# Patient Record
Sex: Female | Born: 1960 | Race: White | Hispanic: No | Marital: Married | Smoking: Current some day smoker
Health system: Southern US, Community
[De-identification: ages and names within clinical notes are randomized; demographics above are authoritative.]

## PROBLEM LIST (undated history)

## (undated) DIAGNOSIS — C50919 Malignant neoplasm of unspecified site of unspecified female breast: Secondary | ICD-10-CM

## (undated) DIAGNOSIS — C801 Malignant (primary) neoplasm, unspecified: Secondary | ICD-10-CM

## (undated) DIAGNOSIS — R42 Dizziness and giddiness: Secondary | ICD-10-CM

## (undated) DIAGNOSIS — R0600 Dyspnea, unspecified: Secondary | ICD-10-CM

## (undated) DIAGNOSIS — I10 Essential (primary) hypertension: Secondary | ICD-10-CM

## (undated) HISTORY — PX: MASTECTOMY: SHX3

## (undated) HISTORY — PX: CARPAL TUNNEL RELEASE: SHX101

---

## 1999-05-18 ENCOUNTER — Other Ambulatory Visit: Admission: RE | Admit: 1999-05-18 | Discharge: 1999-05-18 | Payer: Self-pay | Admitting: Internal Medicine

## 2000-08-04 ENCOUNTER — Encounter: Admission: RE | Admit: 2000-08-04 | Discharge: 2000-08-04 | Payer: Self-pay | Admitting: Endocrinology

## 2000-08-04 ENCOUNTER — Encounter: Payer: Self-pay | Admitting: Endocrinology

## 2002-11-05 ENCOUNTER — Other Ambulatory Visit: Admission: RE | Admit: 2002-11-05 | Discharge: 2002-11-05 | Payer: Self-pay | Admitting: Internal Medicine

## 2003-06-14 ENCOUNTER — Emergency Department (HOSPITAL_COMMUNITY): Admission: EM | Admit: 2003-06-14 | Discharge: 2003-06-14 | Payer: Self-pay | Admitting: Emergency Medicine

## 2003-06-15 ENCOUNTER — Emergency Department (HOSPITAL_COMMUNITY): Admission: EM | Admit: 2003-06-15 | Discharge: 2003-06-16 | Payer: Self-pay | Admitting: Emergency Medicine

## 2004-01-16 ENCOUNTER — Other Ambulatory Visit: Admission: RE | Admit: 2004-01-16 | Discharge: 2004-01-16 | Payer: Self-pay | Admitting: Internal Medicine

## 2006-01-03 ENCOUNTER — Emergency Department (HOSPITAL_COMMUNITY): Admission: EM | Admit: 2006-01-03 | Discharge: 2006-01-03 | Payer: Self-pay | Admitting: Emergency Medicine

## 2006-06-13 ENCOUNTER — Emergency Department (HOSPITAL_COMMUNITY): Admission: EM | Admit: 2006-06-13 | Discharge: 2006-06-13 | Payer: Self-pay | Admitting: Emergency Medicine

## 2007-09-14 ENCOUNTER — Other Ambulatory Visit: Admission: RE | Admit: 2007-09-14 | Discharge: 2007-09-14 | Payer: Self-pay | Admitting: Internal Medicine

## 2008-02-13 ENCOUNTER — Ambulatory Visit (HOSPITAL_BASED_OUTPATIENT_CLINIC_OR_DEPARTMENT_OTHER): Admission: RE | Admit: 2008-02-13 | Discharge: 2008-02-13 | Payer: Self-pay | Admitting: Orthopedic Surgery

## 2008-03-19 ENCOUNTER — Ambulatory Visit (HOSPITAL_BASED_OUTPATIENT_CLINIC_OR_DEPARTMENT_OTHER): Admission: RE | Admit: 2008-03-19 | Discharge: 2008-03-19 | Payer: Self-pay | Admitting: Orthopedic Surgery

## 2008-10-01 ENCOUNTER — Other Ambulatory Visit: Admission: RE | Admit: 2008-10-01 | Discharge: 2008-10-01 | Payer: Self-pay | Admitting: Internal Medicine

## 2008-12-06 ENCOUNTER — Ambulatory Visit (HOSPITAL_BASED_OUTPATIENT_CLINIC_OR_DEPARTMENT_OTHER): Admission: RE | Admit: 2008-12-06 | Discharge: 2008-12-06 | Payer: Self-pay | Admitting: Orthopedic Surgery

## 2009-05-19 ENCOUNTER — Encounter: Admission: RE | Admit: 2009-05-19 | Discharge: 2009-05-19 | Payer: Self-pay | Admitting: Endocrinology

## 2009-05-19 ENCOUNTER — Encounter (INDEPENDENT_AMBULATORY_CARE_PROVIDER_SITE_OTHER): Payer: Self-pay | Admitting: Endocrinology

## 2009-05-19 ENCOUNTER — Encounter (INDEPENDENT_AMBULATORY_CARE_PROVIDER_SITE_OTHER): Payer: Self-pay | Admitting: Diagnostic Radiology

## 2009-05-21 ENCOUNTER — Encounter: Admission: RE | Admit: 2009-05-21 | Discharge: 2009-05-21 | Payer: Self-pay | Admitting: Endocrinology

## 2009-05-30 ENCOUNTER — Encounter (INDEPENDENT_AMBULATORY_CARE_PROVIDER_SITE_OTHER): Payer: Self-pay | Admitting: Surgery

## 2009-05-30 ENCOUNTER — Ambulatory Visit (HOSPITAL_BASED_OUTPATIENT_CLINIC_OR_DEPARTMENT_OTHER): Admission: RE | Admit: 2009-05-30 | Discharge: 2009-05-31 | Payer: Self-pay | Admitting: Surgery

## 2009-06-11 ENCOUNTER — Ambulatory Visit: Payer: Self-pay | Admitting: Oncology

## 2009-06-18 LAB — COMPREHENSIVE METABOLIC PANEL
ALT: 17 U/L (ref 0–35)
AST: 16 U/L (ref 0–37)
Creatinine, Ser: 1.03 mg/dL (ref 0.40–1.20)
Total Bilirubin: 0.4 mg/dL (ref 0.3–1.2)

## 2009-06-18 LAB — CBC WITH DIFFERENTIAL/PLATELET
BASO%: 0.1 % (ref 0.0–2.0)
Basophils Absolute: 0 10*3/uL (ref 0.0–0.1)
EOS%: 1.3 % (ref 0.0–7.0)
HCT: 40.3 % (ref 34.8–46.6)
LYMPH%: 36.8 % (ref 14.0–49.7)
MCH: 29.3 pg (ref 25.1–34.0)
MCHC: 33.2 g/dL (ref 31.5–36.0)
MONO#: 0.4 10*3/uL (ref 0.1–0.9)
NEUT%: 56.7 % (ref 38.4–76.8)
Platelets: 312 10*3/uL (ref 145–400)

## 2009-06-18 LAB — LACTATE DEHYDROGENASE: LDH: 133 U/L (ref 94–250)

## 2009-07-07 ENCOUNTER — Ambulatory Visit (HOSPITAL_COMMUNITY): Admission: RE | Admit: 2009-07-07 | Discharge: 2009-07-07 | Payer: Self-pay | Admitting: Oncology

## 2009-09-12 ENCOUNTER — Ambulatory Visit: Payer: Self-pay | Admitting: Oncology

## 2009-09-17 LAB — CBC WITH DIFFERENTIAL/PLATELET
BASO%: 0.7 % (ref 0.0–2.0)
Basophils Absolute: 0.1 10*3/uL (ref 0.0–0.1)
HCT: 40.5 % (ref 34.8–46.6)
HGB: 13.6 g/dL (ref 11.6–15.9)
LYMPH%: 38.4 % (ref 14.0–49.7)
MCHC: 33.7 g/dL (ref 31.5–36.0)
Platelets: 300 10*3/uL (ref 145–400)
RBC: 4.64 10*6/uL (ref 3.70–5.45)
lymph#: 3.1 10*3/uL (ref 0.9–3.3)

## 2009-09-17 LAB — COMPREHENSIVE METABOLIC PANEL
Albumin: 4.2 g/dL (ref 3.5–5.2)
BUN: 12 mg/dL (ref 6–23)
Creatinine, Ser: 0.71 mg/dL (ref 0.40–1.20)
Glucose, Bld: 116 mg/dL — ABNORMAL HIGH (ref 70–99)
Potassium: 4.2 mEq/L (ref 3.5–5.3)
Sodium: 139 mEq/L (ref 135–145)

## 2009-09-17 LAB — FOLLICLE STIMULATING HORMONE: FSH: 49.3 m[IU]/mL

## 2009-09-24 LAB — ESTRADIOL, ULTRA SENS

## 2009-11-30 ENCOUNTER — Observation Stay (HOSPITAL_COMMUNITY): Admission: EM | Admit: 2009-11-30 | Discharge: 2009-12-01 | Payer: Self-pay | Admitting: Emergency Medicine

## 2009-11-30 ENCOUNTER — Emergency Department (HOSPITAL_COMMUNITY): Admission: EM | Admit: 2009-11-30 | Discharge: 2009-11-30 | Payer: Self-pay | Admitting: Family Medicine

## 2009-12-02 ENCOUNTER — Inpatient Hospital Stay (HOSPITAL_COMMUNITY): Admission: AD | Admit: 2009-12-02 | Discharge: 2009-12-07 | Payer: Self-pay | Admitting: Internal Medicine

## 2009-12-04 ENCOUNTER — Ambulatory Visit: Payer: Self-pay | Admitting: Dentistry

## 2009-12-16 ENCOUNTER — Encounter: Admission: AD | Admit: 2009-12-16 | Discharge: 2009-12-16 | Payer: Self-pay | Admitting: Dentistry

## 2009-12-20 IMAGING — CR DG CHEST 2V
2 series · 2 of 2 positions shown · non-contrast
Comparison: None available

CLINICAL DATA: Breast carcinoma

CHEST - 2 VIEW

[w chest pa]
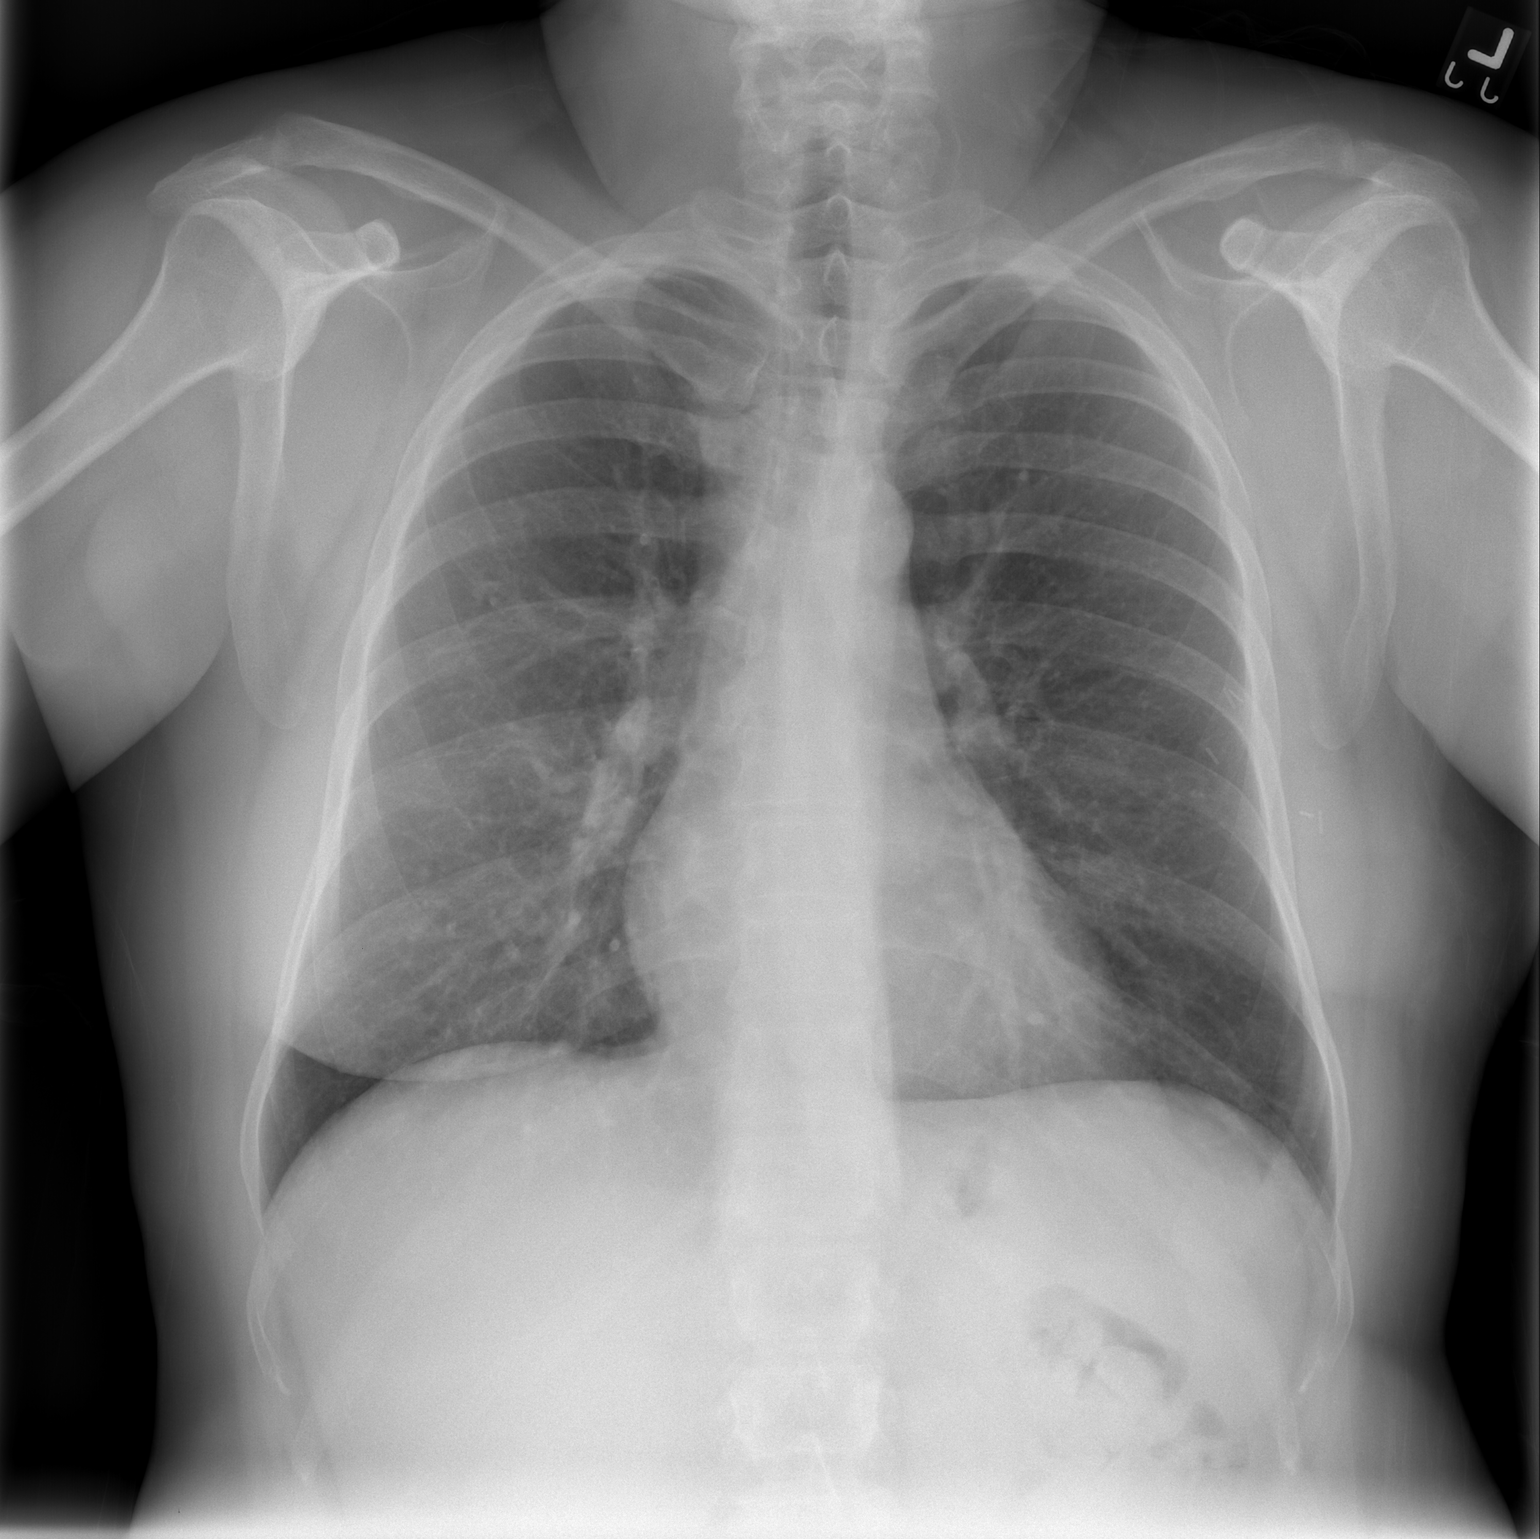

[w chest lat]
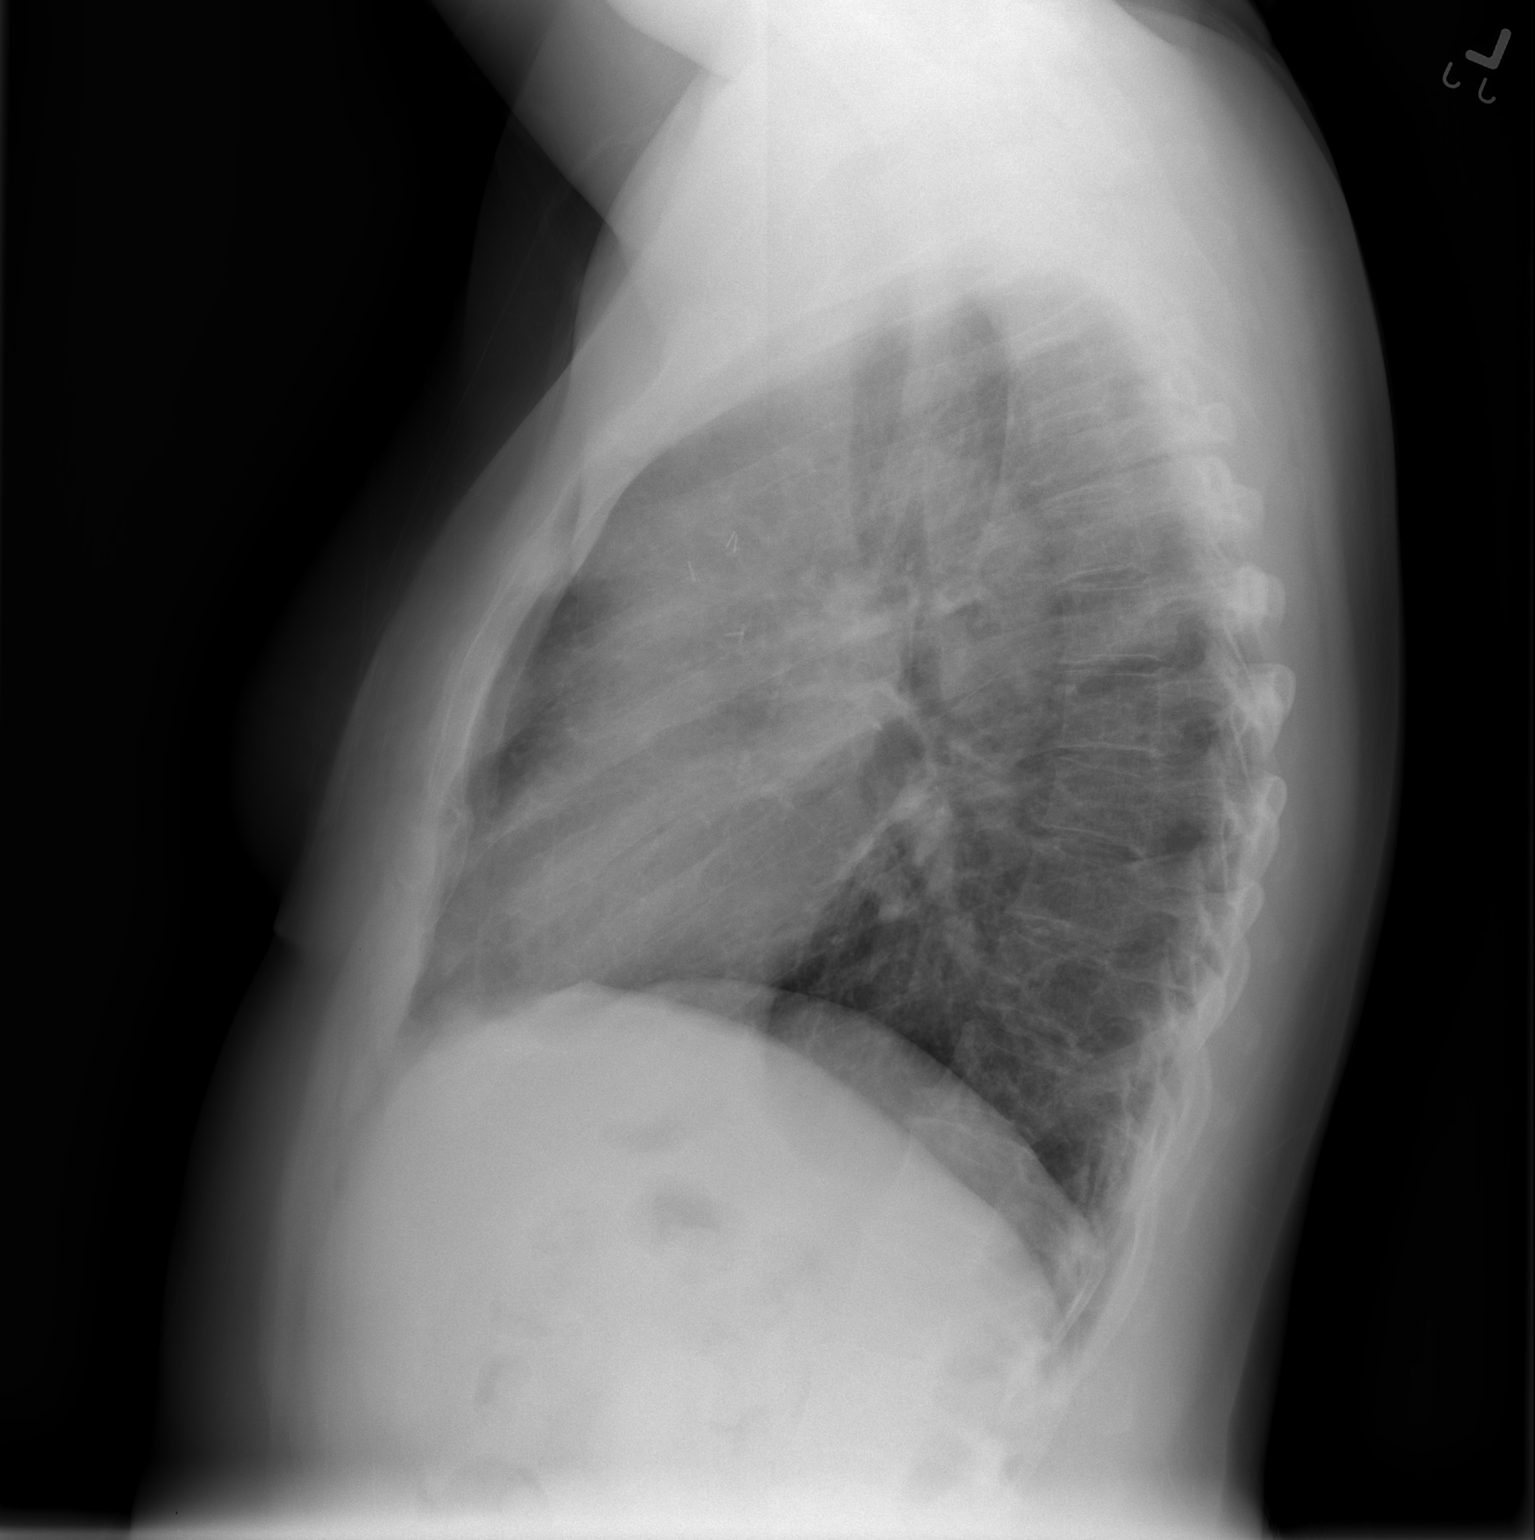

[2 of 2 positions shown; findings below may reference images not displayed]

FINDINGS: Vascular clips in the left axilla.  Changes of left
mastectomy. Lungs clear.  Heart size and pulmonary vascularity
normal.  No effusion.  Visualized bones unremarkable.
IMPRESSION: No acute disease

## 2010-03-17 ENCOUNTER — Ambulatory Visit: Payer: Self-pay | Admitting: Oncology

## 2010-03-19 LAB — COMPREHENSIVE METABOLIC PANEL
AST: 14 U/L (ref 0–37)
Albumin: 3.8 g/dL (ref 3.5–5.2)
Chloride: 103 mEq/L (ref 96–112)
Potassium: 4.6 mEq/L (ref 3.5–5.3)
Total Bilirubin: 0.2 mg/dL — ABNORMAL LOW (ref 0.3–1.2)

## 2010-03-19 LAB — CBC WITH DIFFERENTIAL/PLATELET
BASO%: 0.4 % (ref 0.0–2.0)
Basophils Absolute: 0 10*3/uL (ref 0.0–0.1)
HGB: 13 g/dL (ref 11.6–15.9)
MCH: 29.1 pg (ref 25.1–34.0)
MCHC: 33 g/dL (ref 31.5–36.0)
MONO%: 6.7 % (ref 0.0–14.0)
NEUT#: 4.2 10*3/uL (ref 1.5–6.5)
RBC: 4.46 10*6/uL (ref 3.70–5.45)
RDW: 14.1 % (ref 11.2–14.5)
lymph#: 2.8 10*3/uL (ref 0.9–3.3)

## 2010-03-26 LAB — FOLLICLE STIMULATING HORMONE: FSH: 33.4 m[IU]/mL

## 2010-03-31 ENCOUNTER — Encounter: Admission: RE | Admit: 2010-03-31 | Discharge: 2010-03-31 | Payer: Self-pay | Admitting: Oncology

## 2010-04-03 LAB — ESTRADIOL, ULTRA SENS: Estradiol, Ultra Sensitive: 11 pg/mL

## 2010-04-16 ENCOUNTER — Ambulatory Visit: Payer: Self-pay | Admitting: Oncology

## 2010-07-10 ENCOUNTER — Ambulatory Visit: Payer: Self-pay | Admitting: Oncology

## 2010-08-09 ENCOUNTER — Encounter: Payer: Self-pay | Admitting: Endocrinology

## 2010-08-10 ENCOUNTER — Ambulatory Visit (HOSPITAL_BASED_OUTPATIENT_CLINIC_OR_DEPARTMENT_OTHER): Payer: 59 | Admitting: Oncology

## 2010-08-12 ENCOUNTER — Other Ambulatory Visit: Payer: Self-pay | Admitting: Oncology

## 2010-08-12 DIAGNOSIS — C50919 Malignant neoplasm of unspecified site of unspecified female breast: Secondary | ICD-10-CM

## 2010-10-05 LAB — DIFFERENTIAL
Basophils Absolute: 0.1 10*3/uL (ref 0.0–0.1)
Basophils Relative: 1 % (ref 0–1)
Basophils Relative: 0 % (ref 0–1)
Eosinophils Absolute: 0.1 K/uL (ref 0.0–0.7)
Eosinophils Relative: 1 % (ref 0–5)
Eosinophils Relative: 1 % (ref 0–5)
Lymphocytes Relative: 28 % (ref 12–46)
Lymphocytes Relative: 25 % (ref 12–46)
Lymphocytes Relative: 27 % (ref 12–46)
Lymphs Abs: 3 K/uL (ref 0.7–4.0)
Lymphs Abs: 2.1 10*3/uL (ref 0.7–4.0)
Monocytes Absolute: 0.7 K/uL (ref 0.1–1.0)
Monocytes Absolute: 0.6 10*3/uL (ref 0.1–1.0)
Monocytes Relative: 7 % (ref 3–12)
Monocytes Relative: 6 % (ref 3–12)
Monocytes Relative: 8 % (ref 3–12)
Neutro Abs: 6.8 10*3/uL (ref 1.7–7.7)
Neutro Abs: 4.8 10*3/uL (ref 1.7–7.7)
Neutro Abs: 6.8 10*3/uL (ref 1.7–7.7)
Neutrophils Relative %: 64 % (ref 43–77)
Neutrophils Relative %: 62 % (ref 43–77)

## 2010-10-05 LAB — BASIC METABOLIC PANEL
BUN: 11 mg/dL (ref 6–23)
CO2: 24 mEq/L (ref 19–32)
Calcium: 8.7 mg/dL (ref 8.4–10.5)
Calcium: 8.9 mg/dL (ref 8.4–10.5)
Calcium: 9.1 mg/dL (ref 8.4–10.5)
Creatinine, Ser: 0.98 mg/dL (ref 0.4–1.2)
GFR calc Af Amer: >60 mL/min (ref >60)
GFR calc Af Amer: >60 mL/min (ref >60)
GFR calc non Af Amer: >60 mL/min (ref >60)
GFR calc non Af Amer: >60 mL/min (ref >60)
GFR calc non Af Amer: >60 mL/min (ref >60)
Glucose, Bld: 89 mg/dL (ref 70–99)
Potassium: 4.2 mEq/L (ref 3.5–5.1)
Potassium: 4.9 mEq/L (ref 3.5–5.1)
Sodium: 140 mEq/L (ref 135–145)

## 2010-10-05 LAB — CULTURE, BLOOD (ROUTINE X 2)
Culture: NO GROWTH
Culture: NO GROWTH

## 2010-10-05 LAB — CBC
HCT: 40.7 % (ref 36.0–46.0)
HCT: 37.3 % (ref 36.0–46.0)
Hemoglobin: 13.9 g/dL (ref 12.0–15.0)
Hemoglobin: 12.5 g/dL (ref 12.0–15.0)
MCHC: 34.1 g/dL (ref 30.0–36.0)
MCHC: 32.9 g/dL (ref 30.0–36.0)
MCHC: 33.2 g/dL (ref 30.0–36.0)
MCHC: 33.5 g/dL (ref 30.0–36.0)
MCV: 87.7 fL (ref 78.0–100.0)
MCV: 87.9 fL (ref 78.0–100.0)
Platelets: 249 10*3/uL (ref 150–400)
Platelets: 267 10*3/uL (ref 150–400)
RBC: 4.64 MIL/uL (ref 3.87–5.11)
RBC: 4.2 MIL/uL (ref 3.87–5.11)
RBC: 4.26 MIL/uL (ref 3.87–5.11)
RBC: 4.42 MIL/uL (ref 3.87–5.11)
RDW: 14.2 % (ref 11.5–15.5)
RDW: 14.1 % (ref 11.5–15.5)
RDW: 14.4 % (ref 11.5–15.5)
WBC: 10.7 10*3/uL — ABNORMAL HIGH (ref 4.0–10.5)
WBC: 7.8 10*3/uL (ref 4.0–10.5)

## 2010-10-05 LAB — LIPID PANEL
Cholesterol: 220 mg/dL — ABNORMAL HIGH (ref 0–200)
LDL Cholesterol: 159 mg/dL — ABNORMAL HIGH (ref 0–99)
Total CHOL/HDL Ratio: 7.9 RATIO

## 2010-10-05 LAB — ANAEROBIC CULTURE

## 2010-10-05 LAB — CREATININE, SERUM
Creatinine, Ser: 0.89 mg/dL (ref 0.4–1.2)
GFR calc Af Amer: >60 mL/min (ref >60)

## 2010-10-05 LAB — APTT: aPTT: 29 seconds (ref 24–37)

## 2010-10-05 LAB — BASIC METABOLIC PANEL WITH GFR
Chloride: 105 meq/L (ref 96–112)
Creatinine, Ser: 0.78 mg/dL (ref 0.4–1.2)
GFR calc Af Amer: >60 mL/min (ref >60)
Glucose, Bld: 78 mg/dL (ref 70–99)
Sodium: 135 meq/L (ref 135–145)

## 2010-10-21 LAB — CBC
HCT: 40.2 % (ref 36.0–46.0)
Hemoglobin: 13.9 g/dL (ref 12.0–15.0)
MCV: 86.9 fL (ref 78.0–100.0)
RBC: 4.63 MIL/uL (ref 3.87–5.11)
WBC: 8.6 10*3/uL (ref 4.0–10.5)

## 2010-10-21 LAB — DIFFERENTIAL
Basophils Absolute: 0.1 10*3/uL (ref 0.0–0.1)
Eosinophils Absolute: 0.2 10*3/uL (ref 0.0–0.7)
Eosinophils Relative: 3 % (ref 0–5)
Lymphocytes Relative: 33 % (ref 12–46)

## 2010-10-21 LAB — COMPREHENSIVE METABOLIC PANEL
AST: 17 U/L (ref 0–37)
BUN: 12 mg/dL (ref 6–23)
CO2: 25 mEq/L (ref 19–32)
Chloride: 106 mEq/L (ref 96–112)
Creatinine, Ser: 0.79 mg/dL (ref 0.4–1.2)
GFR calc Af Amer: >60 mL/min (ref >60)
GFR calc non Af Amer: >60 mL/min (ref >60)
Total Bilirubin: 0.5 mg/dL (ref 0.3–1.2)

## 2010-10-27 LAB — POCT HEMOGLOBIN-HEMACUE: Hemoglobin: 14.8 g/dL (ref 12.0–15.0)

## 2010-11-18 ENCOUNTER — Other Ambulatory Visit: Payer: Self-pay | Admitting: Oncology

## 2010-11-18 DIAGNOSIS — R51 Headache: Secondary | ICD-10-CM

## 2010-11-18 DIAGNOSIS — R42 Dizziness and giddiness: Secondary | ICD-10-CM

## 2010-11-24 ENCOUNTER — Other Ambulatory Visit (HOSPITAL_COMMUNITY): Payer: Self-pay

## 2010-11-27 ENCOUNTER — Ambulatory Visit (HOSPITAL_COMMUNITY)
Admission: RE | Admit: 2010-11-27 | Discharge: 2010-11-27 | Disposition: A | Discharge status: Home / Self Care | Payer: 59 | Source: Ambulatory Visit | Attending: Oncology | Admitting: Oncology

## 2010-11-27 DIAGNOSIS — R51 Headache: Secondary | ICD-10-CM | POA: Insufficient documentation

## 2010-11-27 DIAGNOSIS — C50919 Malignant neoplasm of unspecified site of unspecified female breast: Secondary | ICD-10-CM | POA: Insufficient documentation

## 2010-11-27 DIAGNOSIS — R42 Dizziness and giddiness: Secondary | ICD-10-CM | POA: Insufficient documentation

## 2010-11-27 DIAGNOSIS — R110 Nausea: Secondary | ICD-10-CM | POA: Insufficient documentation

## 2010-11-27 LAB — CREATININE, SERUM: GFR calc non Af Amer: >60 mL/min (ref >60)

## 2010-11-27 MED ORDER — GADOBENATE DIMEGLUMINE 529 MG/ML IV SOLN
17.0000 mL | Freq: Once | INTRAVENOUS | Status: AC | PRN
  Administered 2010-11-27: 17 mL via INTRAVENOUS

## 2010-12-01 NOTE — Op Note (Signed)
[name], [name]                  ACCOUNT NO.:  [id number]   MEDICAL RECORD NO.:  [id number]          PATIENT TYPE:  AMB   LOCATION:  DSC                          FACILITY:  [facility]   PHYSICIAN:  Katy Fitch. Sypher, M.D. DATE OF BIRTH:  [birth year]   DATE OF PROCEDURE:  12/06/2008  DATE OF DISCHARGE:                               OPERATIVE REPORT   PREOPERATIVE DIAGNOSIS:  Bilateral thumb stenosing tenosynovitis with  pain and inability to move interphalangeal joint.   POSTOPERATIVE DIAGNOSIS:  Bilateral thumb stenosing tenosynovitis with  pain and inability to move interphalangeal joint.   OPERATION:  1. Release of right thumb A1 pulley.  2. Release of left thumb A1 pulley.   SURGEON:  Katy Fitch. Sypher, MD   ASSISTANT:  Annye Rusk, PA-C   ANESTHESIA:  Lidocaine 2% metacarpal head level block and flexor sheath  block of right thumb and left thumb.   SUPERVISING ANESTHESIOLOGIST:  Zenon Mayo, MD   INDICATIONS:  Carolyn Barron is a 50 year old woman well acquainted with our  practice.  She has a history of bilateral thumb stenosing tenosynovitis.  She presents for evaluation and management of this predicament.  Her  primary care physician is Dr. Juleen China.  In the office, we discussed  treatment alternatives including injection or release of the A1 pulleys.  She elected to proceed directly to release of the A1 pulleys.   After informed consent, she is brought to the operating room at this  time.   PROCEDURE:  Carolyn Barron is brought to room 8 of the [facility] and placed in supine position upon the operating table.  Following light sedation, 2% lidocaine was infiltrated into the path of  the intended incisions for the right and left thumbs.   The right and left arms were prepped with Betadine soap solution and  sterilely draped.  A pneumatic tourniquet was applied to the proximal  left brachium.   Procedure commenced with exsanguination of the right hand  and forearm  with an Esmarch bandage and use of the Esmarch on the forearm as a  tourniquet.  The A1 pulley was approached through transverse incision  directly over the palpably thickened pulley.  Subcutaneous tissues were  carefully divided taking care to gently retract the fascia.  The radial  proper digital nerve was identified and retracted.  The A1 pulley was  split with scalpel and scissors.  The tendon was moderately edematous  and frayed due to chronic compression.  Thereafter, full active motion  of the IP joint was recovered.  The wound was repaired with mattress  suture of 5-0 nylon.  A compressive dressing was applied with Xeroflo,  sterile gauze and Ace wrap.   Attention then directed to the left arm.  The left arm was exsanguinated  with an Esmarch bandage and an arterial tourniquet on the proximal  brachium was inflated to 290 mmHg due to systolic hypertension.  During  a coughing episode, we exceeded the capacity of the tourniquet to  provide hemostasis.  Therefore, the arm was re-exsanguinated and the  arterial  tourniquet inflated at 300 mmHg.  Once again a short incision  was fashioned directly over the thickened A1 pulley.  Subcutaneous  tissues were carefully divided taking care to identify and retract the  radial proper digital nerve.  The A1 pulley was isolated, split with  scalpel and scissors.  The tendons were delivered and found to be  swollen but otherwise normal.  Full active motion of the IP joint was  recovered.   The wound was repaired with mattress sutures of 5-0 nylon.   A compressive dressing was applied Xeroflo, sterile gauze and an Ace  wrap.   Ms. Gionfriddo is provided a prescription for Vicodin 5 mg one p.o. q.4-6 h.  p.r.n. pain 20 tablets.   We will see her back for followup in the office in approximately 1 week.      Katy Fitch Sypher, M.D.  Electronically Signed     RVS/MEDQ  D:  12/06/2008  T:  12/07/2008  Job:  [id number]

## 2010-12-01 NOTE — Op Note (Signed)
Carolyn Barron, ROTUNDO                  ACCOUNT NO.:  [id number]   MEDICAL RECORD NO.:  [id number]          PATIENT TYPE:  AMB   LOCATION:  DSC                          FACILITY:  [facility]   PHYSICIAN:  Katy Fitch. Sypher, M.D. DATE OF BIRTH:  [birth year]   DATE OF PROCEDURE:  03/19/2008  DATE OF DISCHARGE:                               OPERATIVE REPORT   PREOPERATIVE DIAGNOSIS:  Entrapment neuropathy, median nerve, left  carpal tunnel.   POSTOPERATIVE DIAGNOSIS:  Entrapment neuropathy, median nerve, left  carpal tunnel.   OPERATION:  Release of left transverse carpal ligament.   SURGEON:  Katy Fitch. Sypher, MD   ASSISTANT:  Marveen Reeks Dasnoit, PA-C   ANESTHESIA:  General by LMA.   SUPERVISING ANESTHESIOLOGIST:  Germaine Pomfret, MD   INDICATIONS:  Carnisha Feltz is a 39-year woman referred for evaluation and  management of bilateral carpal tunnel syndrome.  She was noted to have  clinical signs of entrapment neuropathy confirmed by electrodiagnostic  studies.  She is status post release of right transverse carpal ligament  and now return for similar surgery on the left.  After informed consent,  she is brought to the operating room at this time.   PROCEDURE:  Samayra Hebel is brought to the operating room and placed in  supine position on the operating table.  Following induction of general  anesthesia by LMA technique, the left arm was prepped with Betadine soap  solution and sterilely draped.  A pneumatic tourniquet was applied to  the proximal left brachium.  Following exsanguination of the limb with  an Esmarch bandage, the arterial tourniquet was inflated to 220 mmHg.  The procedure commenced with short incision in line of the ring finger  and the palm.  Subcutaneous tissues were carefully divided revealing the  palmar fascia.  This split longitudinally to reveal the common sensory  branch of the median nerve.  These were followed back to the transverse  carpal ligament which was  gently isolated from the median nerve.  The  ligament was then released along its ulnar border extending into the  distal forearm.  There were no masses or other predicaments noted.  The  ulnar bursa was fibrotic.   The wound was then repaired with intradermal 3-0 Prolene.  A compressive  dressing was applied with a volar plaster splint maintaining this in 5  degrees of dorsiflexion.  For aftercare, she is provided a prescription  for Percocet 5 mg one p.o. q.4-6 h. p.r.n. pain, 20 tablets without  refill.   I will see her back in followup in the office in 1 week for dressing  change and initiation of therapy program.      Katy Fitch. Sypher, M.D.  Electronically Signed     RVS/MEDQ  D:  03/19/2008  T:  03/20/2008  Job:  [id number]   cc:   Coralyn Helling, MD

## 2010-12-01 NOTE — Op Note (Signed)
Carolyn Barron, Carolyn Barron                  ACCOUNT NO.:  [id number]   MEDICAL RECORD NO.:  [id number]          PATIENT TYPE:  AMB   LOCATION:  DSC                          FACILITY:  [facility]   PHYSICIAN:  Katy Fitch. Sypher, M.D. DATE OF BIRTH:  02-Dec-1960   DATE OF PROCEDURE:  02/13/2008  DATE OF DISCHARGE:                               OPERATIVE REPORT   PREOPERATIVE DIAGNOSES:  Entrapment neuropathy, median nerve right  carpal tunnel.   POSTOPERATIVE DIAGNOSES:  Entrapment neuropathy, median nerve right  carpal tunnel.   OPERATION:  Release of right transverse carpal ligament.   OPERATING SURGEON:  Katy Fitch. Sypher, MD   ASSISTANT:  Marveen Reeks Dasnoit, PA   ANESTHESIA:  General by LMA.   SUPERVISING ANESTHESIOLOGIST:  Germaine Pomfret, MD   INDICATIONS:  Carolyn Barron is a 50 year old woman referred through the  courtesy of Ms. Vangie Bicker of [facility] for  evaluation and management of carpal tunnel syndrome.  Electrodiagnostic  studies were obtained confirming significant right carpal tunnel  syndrome.   Due to the failure to respond to nonoperative measures, Carolyn Barron was  brought to the operating room at this time for release of right  transverse carpal ligament.   After informed consent during which questions were invited and answered  in detail, she was evaluated by Dr. Jean Rosenthal of the Anesthesia Service.  General anesthesia by LMA was recommended and accepted.   PROCEDURE:  Carolyn Barron was brought to the operating room and placed in  supine position on the operating table.   Following the induction of general anesthesia by LMA technique, the  right arm was prepped with Betadine soap solution and sterilely draped.  A pneumatic tourniquet was applied to the proximal right brachium.   Following exsanguination of the right arm with Esmarch bandage, arterial  tourniquet was inflated to 220 mmHg.  The procedure commenced with a  short incision at the  line of the ring finger and the palm.  Subcutaneous tissues were carefully divided revealing the palmar fascia.  This was split longitudinally to reveal the common sensory branches of  the median nerve.  The branches of the median nerve were followed back  to the median nerve proper, which was gently isolated from the  transverse carpal ligament using a Insurance risk surveyor.   The transverse carpal ligament was then released subcutaneously into the  distal forearm.   This widely opened the carpal canal.   No masses or other predicaments were noted.   Bleeding points along the margin of the released ligament were  electrocauterized with bipolar current followed by repair of the skin  with intradermal 3-0 Prolene suture.   A compressive dressing was applied with a volar plaster splint  maintaining the wrist in 5 degrees of dorsiflexion.   For aftercare, Carolyn Barron is provided a prescription for Percocet 5 mg 1  p.o. q.4-6 h. p.r.n. pain 20 tablets without refill.  She will return to  see [place] in the office in 7-10 days for dressing changes and suture  removal.      Carolyn Maduro  Dot Barron, M.D.  Electronically Signed     RVS/MEDQ  D:  02/13/2008  T:  02/14/2008  Job:  [id number]   cc:   Remi Deter A. Grant Ruts., M.D.

## 2011-01-05 ENCOUNTER — Encounter (HOSPITAL_BASED_OUTPATIENT_CLINIC_OR_DEPARTMENT_OTHER): Payer: 59 | Admitting: Oncology

## 2011-01-05 ENCOUNTER — Other Ambulatory Visit: Payer: Self-pay | Admitting: Oncology

## 2011-01-05 DIAGNOSIS — C50419 Malignant neoplasm of upper-outer quadrant of unspecified female breast: Secondary | ICD-10-CM

## 2011-01-05 DIAGNOSIS — Z170 Estrogen receptor positive status [ER+]: Secondary | ICD-10-CM

## 2011-01-05 DIAGNOSIS — Z1231 Encounter for screening mammogram for malignant neoplasm of breast: Secondary | ICD-10-CM

## 2011-01-05 LAB — CBC WITH DIFFERENTIAL/PLATELET
BASO%: 0.4 % (ref 0.0–2.0)
EOS%: 1 % (ref 0.0–7.0)
HCT: 37 % (ref 34.8–46.6)
LYMPH%: 44 % (ref 14.0–49.7)
MCH: 29.5 pg (ref 25.1–34.0)
MCHC: 33.6 g/dL (ref 31.5–36.0)
MCV: 87.8 fL (ref 79.5–101.0)
MONO%: 6.2 % (ref 0.0–14.0)
NEUT%: 48.4 % (ref 38.4–76.8)
Platelets: 257 10*3/uL (ref 145–400)

## 2011-01-05 LAB — COMPREHENSIVE METABOLIC PANEL
ALT: 13 U/L (ref 0–35)
AST: 14 U/L (ref 0–37)
Creatinine, Ser: 0.96 mg/dL (ref 0.50–1.10)
Total Bilirubin: 0.1 mg/dL — ABNORMAL LOW (ref 0.3–1.2)

## 2011-04-05 ENCOUNTER — Other Ambulatory Visit: Payer: Self-pay | Admitting: Oncology

## 2011-04-05 ENCOUNTER — Ambulatory Visit
Admission: RE | Admit: 2011-04-05 | Discharge: 2011-04-05 | Disposition: A | Discharge status: Home / Self Care | Payer: 59 | Source: Ambulatory Visit | Attending: Oncology | Admitting: Oncology

## 2011-04-05 DIAGNOSIS — Z1231 Encounter for screening mammogram for malignant neoplasm of breast: Secondary | ICD-10-CM

## 2011-04-16 LAB — POCT HEMOGLOBIN-HEMACUE: Hemoglobin: 14.6

## 2011-04-21 LAB — POCT HEMOGLOBIN-HEMACUE: Hemoglobin: 14

## 2011-04-30 ENCOUNTER — Ambulatory Visit (HOSPITAL_COMMUNITY): Payer: Self-pay

## 2011-04-30 ENCOUNTER — Encounter (HOSPITAL_COMMUNITY): Payer: Self-pay

## 2011-04-30 ENCOUNTER — Inpatient Hospital Stay (HOSPITAL_COMMUNITY): Admission: RE | Admit: 2011-04-30 | Payer: Self-pay | Source: Ambulatory Visit

## 2011-05-03 ENCOUNTER — Encounter (HOSPITAL_COMMUNITY): Payer: Self-pay

## 2011-05-03 ENCOUNTER — Inpatient Hospital Stay (HOSPITAL_COMMUNITY): Admission: RE | Admit: 2011-05-03 | Payer: Self-pay | Source: Ambulatory Visit

## 2011-05-03 ENCOUNTER — Ambulatory Visit (HOSPITAL_COMMUNITY): Payer: Self-pay

## 2011-05-05 ENCOUNTER — Other Ambulatory Visit: Payer: Self-pay | Admitting: Oncology

## 2011-05-05 DIAGNOSIS — C50919 Malignant neoplasm of unspecified site of unspecified female breast: Secondary | ICD-10-CM

## 2011-05-24 ENCOUNTER — Inpatient Hospital Stay (HOSPITAL_COMMUNITY): Admission: RE | Admit: 2011-05-24 | Payer: 59 | Source: Ambulatory Visit

## 2011-05-24 ENCOUNTER — Other Ambulatory Visit: Payer: 59 | Admitting: Lab

## 2011-06-01 ENCOUNTER — Ambulatory Visit: Payer: 59 | Admitting: Oncology

## 2011-10-03 ENCOUNTER — Other Ambulatory Visit: Payer: Self-pay | Admitting: Oncology

## 2012-04-11 ENCOUNTER — Other Ambulatory Visit (INDEPENDENT_AMBULATORY_CARE_PROVIDER_SITE_OTHER): Payer: Self-pay | Admitting: Surgery

## 2012-04-16 ENCOUNTER — Emergency Department (HOSPITAL_BASED_OUTPATIENT_CLINIC_OR_DEPARTMENT_OTHER): Payer: 59

## 2012-04-16 ENCOUNTER — Encounter (HOSPITAL_BASED_OUTPATIENT_CLINIC_OR_DEPARTMENT_OTHER): Payer: Self-pay | Admitting: *Deleted

## 2012-04-16 ENCOUNTER — Emergency Department (HOSPITAL_BASED_OUTPATIENT_CLINIC_OR_DEPARTMENT_OTHER)
Admission: EM | Admit: 2012-04-16 | Discharge: 2012-04-16 | Disposition: A | Discharge status: Home / Self Care | Payer: 59 | Attending: Emergency Medicine | Admitting: Emergency Medicine

## 2012-04-16 DIAGNOSIS — R0789 Other chest pain: Secondary | ICD-10-CM

## 2012-04-16 DIAGNOSIS — Z859 Personal history of malignant neoplasm, unspecified: Secondary | ICD-10-CM | POA: Insufficient documentation

## 2012-04-16 DIAGNOSIS — R911 Solitary pulmonary nodule: Secondary | ICD-10-CM | POA: Insufficient documentation

## 2012-04-16 DIAGNOSIS — F17200 Nicotine dependence, unspecified, uncomplicated: Secondary | ICD-10-CM | POA: Insufficient documentation

## 2012-04-16 DIAGNOSIS — R071 Chest pain on breathing: Secondary | ICD-10-CM | POA: Insufficient documentation

## 2012-04-16 DIAGNOSIS — Z881 Allergy status to other antibiotic agents status: Secondary | ICD-10-CM | POA: Insufficient documentation

## 2012-04-16 DIAGNOSIS — I10 Essential (primary) hypertension: Secondary | ICD-10-CM | POA: Insufficient documentation

## 2012-04-16 HISTORY — DX: Malignant (primary) neoplasm, unspecified: C80.1

## 2012-04-16 HISTORY — DX: Essential (primary) hypertension: I10

## 2012-04-16 LAB — BASIC METABOLIC PANEL
BUN: 13 mg/dL (ref 6–23)
Chloride: 103 mEq/L (ref 96–112)
GFR calc Af Amer: >90 mL/min (ref >90)
Potassium: 4.5 mEq/L (ref 3.5–5.1)
Sodium: 139 mEq/L (ref 135–145)

## 2012-04-16 LAB — URINALYSIS, ROUTINE W REFLEX MICROSCOPIC
Leukocytes, UA: NEGATIVE
Nitrite: NEGATIVE
Specific Gravity, Urine: 1.022 (ref 1.005–1.030)
pH: 6 (ref 5.0–8.0)

## 2012-04-16 MED ORDER — OXYCODONE-ACETAMINOPHEN 5-325 MG PO TABS: 2.0000 | ORAL_TABLET | Freq: Four times a day (QID) | ORAL | Status: DC | PRN

## 2012-04-16 MED ORDER — IOHEXOL 350 MG/ML SOLN
100.0000 mL | Freq: Once | INTRAVENOUS | Status: AC | PRN
  Administered 2012-04-16: 100 mL via INTRAVENOUS

## 2012-04-16 MED ORDER — OXYCODONE-ACETAMINOPHEN 5-325 MG PO TABS
2.0000 | ORAL_TABLET | Freq: Once | ORAL | Status: AC
  Administered 2012-04-16: 2 via ORAL
  Filled 2012-04-16 (×2): qty 2

## 2012-04-16 NOTE — ED Notes (Signed)
Pt c/o right shoulder and back pain x 2 weeks. No known injury.

## 2012-04-16 NOTE — ED Provider Notes (Addendum)
History  This chart was scribed for Vida Roller, MD by Ladona Ridgel Day. This patient was seen in room MHT13/MHT13 and the patient's care was started at 1326.   CSN: [id number]  Arrival date & time 04/16/12  1326   First MD Initiated Contact with Patient 04/16/12 1442      Chief Complaint  Patient presents with  . Shoulder Pain   Patient is a 51 y.o. female presenting with shoulder pain. The history is provided by the patient and a relative. No language interpreter was used.  Shoulder Pain Pertinent negatives include no abdominal pain and no shortness of breath.   Carolyn Barron is a 51 y.o. female who presents to the Emergency Department complaining of intermittent right shoulder pain for two weeks with no known injury. She states occasional pain with ROM and thinks maybe she pulled a muscle while heavy lifting for her job at [employer]. She had surgical treatment for breast CA several years ago with a left mastectomy and has also been taking tamoxifen for 3 years. She denies any recent long travels or surgeries; deep breaths does not maker her pain worse. She denies cough, SOB, abdominal pain, balance issues, nausea, emesis, diarrhea. She is a 1 ppd smoker.  No trauma, no travel, no immobilization, no fever, and minimal cough and shortness of breath. She denies swelling of the lower extremities  Past Medical History  Diagnosis Date  . Cancer   . Hypertension     Past Surgical History  Procedure Date  . Mastectomy     History reviewed. No pertinent family history.  History  Substance Use Topics  . Smoking status: Current Every Day Smoker  . Smokeless tobacco: Not on file  . Alcohol Use: No    OB History    Grav Para Term Preterm Abortions TAB SAB Ect Mult Living                  Review of Systems  Constitutional: Negative for fever and chills.  Respiratory: Negative for cough and shortness of breath.   Gastrointestinal: Negative for nausea, vomiting, abdominal pain and  diarrhea.  Musculoskeletal:       Right shoulder pain  Neurological: Negative for weakness.  All other systems reviewed and are negative.    Allergies  Clindamycin/lincomycin  Home Medications   Current Outpatient Rx  Name Route Sig Dispense Refill  . LISINOPRIL PO Oral Take by mouth.    Marland Kitchen PAROXETINE HCL 40 MG PO TABS Oral Take 40 mg by mouth every morning.    Marland Kitchen TAMOXIFEN CITRATE 20 MG PO TABS  TAKE 1 TABLET BY MOUTH EVERY DAY 90 tablet 0    Triage Vitals: BP 132/82  Pulse 82  Temp 98.2 F (36.8 C) (Oral)  Resp 18  Ht 5\' 2"  (1.575 m)  Wt 176 lb (79.833 kg)  BMI 32.19 kg/m2  SpO2 98%  Physical Exam  Nursing note and vitals reviewed. Constitutional: She is oriented to person, place, and time. She appears well-developed and well-nourished. No distress.  HENT:  Head: Normocephalic and atraumatic.  Eyes: EOM are normal.  Neck: Neck supple. No tracheal deviation present.  Cardiovascular: Normal rate.   Pulmonary/Chest: Effort normal. No respiratory distress. She has no wheezes. She has no rales. She exhibits no tenderness.  Abdominal: Soft. She exhibits no distension.  Musculoskeletal: Normal range of motion. She exhibits no edema and no tenderness.       No asymmetry to the lower extremities, no edema. No significant  pain with range of motion of the right shoulder, no tenderness over the posterior thoracic wall including the rhomboid musculature the scapula or the infrascapular area. Full range of motion of the right shoulder with minimal discomfort  Neurological: She is alert and oriented to person, place, and time.  Skin: Skin is warm and dry.  Psychiatric: She has a normal mood and affect. Her behavior is normal.    ED Course  Procedures (including critical care time) DIAGNOSTIC STUDIES: Oxygen Saturation is 98% on room air, normal by my interpretation.    COORDINATION OF CARE: At 310 PM Discussed treatment plan with patient which includes scheduling a chest CT.  Patient agrees.   Labs Reviewed - No data to display No results found.   No diagnosis found.    MDM  The patient does have several risk factors for pulmonary embolism including breast cancer and tamoxifen therapy however she also has no hard signs of pulmonary embolism. I however do not find any reproducible pain on my exam and thus would consider pulmonary and was in the differential diagnosis. Her pulse is 82, her oxygen saturation is 90% and she is otherwise well. Percocet given, CT angiogram ordered.  I personally performed the services described in this documentation, which was scribed in my presence. The recorded information has been reviewed and considered.     Change of shift, care signed out to Dr. Fonnie Jarvis pending CT scan of the chest. If CT scan is negative I suspect a muscular etiology. Pulmonary embolus and to be ruled out.       Vida Roller, MD 04/16/12 1631  Vida Roller, MD 04/16/12 [phone]

## 2012-04-16 NOTE — ED Provider Notes (Signed)
Pt informed of CT results and need for f/u.  Patient / Family / Caregiver informed of clinical course, understand medical decision-making process, and agree with plan.  Hurman Horn, MD 04/17/12 [phone]

## 2012-04-18 ENCOUNTER — Telehealth: Payer: Self-pay | Admitting: *Deleted

## 2012-04-18 NOTE — Telephone Encounter (Signed)
Message left by Crystal in scheduling stating pt called needing to schedule an appointment due to recent ER visit.  This note with CT image which is concerning pt will be given to MD for review and appropriate follow up.

## 2012-04-18 NOTE — Telephone Encounter (Signed)
Per MD review of recent ER visit with CT scan recommended next available appt for follow up.   This RN called to pt at number per demographics and received busy x 2 then with 2 additional attempts no connection at all.  Obtained per scheduling cell number of [phone].  Called above number and received VM stating " this is Grandma's phone ".  Message left requesting return call to this RN.  Appointment POF entered and sent to scheduling.

## 2012-04-20 ENCOUNTER — Other Ambulatory Visit: Payer: Self-pay | Admitting: Oncology

## 2012-04-25 ENCOUNTER — Other Ambulatory Visit: Payer: Self-pay | Admitting: Oncology

## 2012-04-25 DIAGNOSIS — Z9012 Acquired absence of left breast and nipple: Secondary | ICD-10-CM

## 2012-04-25 DIAGNOSIS — Z1231 Encounter for screening mammogram for malignant neoplasm of breast: Secondary | ICD-10-CM

## 2012-04-26 ENCOUNTER — Ambulatory Visit (HOSPITAL_BASED_OUTPATIENT_CLINIC_OR_DEPARTMENT_OTHER): Payer: 59 | Admitting: Oncology

## 2012-04-26 ENCOUNTER — Other Ambulatory Visit: Payer: Self-pay | Admitting: Oncology

## 2012-04-26 ENCOUNTER — Telehealth: Payer: Self-pay | Admitting: Oncology

## 2012-04-26 ENCOUNTER — Ambulatory Visit (HOSPITAL_COMMUNITY)
Admission: RE | Admit: 2012-04-26 | Discharge: 2012-04-26 | Disposition: A | Discharge status: Home / Self Care | Payer: 59 | Source: Ambulatory Visit | Attending: Oncology | Admitting: Oncology

## 2012-04-26 VITALS — BP 149/91 | HR 108 | Temp 98.7°F | Resp 20 | Ht 62.0 in | Wt 177.8 lb

## 2012-04-26 DIAGNOSIS — C50919 Malignant neoplasm of unspecified site of unspecified female breast: Secondary | ICD-10-CM | POA: Insufficient documentation

## 2012-04-26 DIAGNOSIS — C50419 Malignant neoplasm of upper-outer quadrant of unspecified female breast: Secondary | ICD-10-CM

## 2012-04-26 DIAGNOSIS — Z170 Estrogen receptor positive status [ER+]: Secondary | ICD-10-CM

## 2012-04-26 DIAGNOSIS — F17200 Nicotine dependence, unspecified, uncomplicated: Secondary | ICD-10-CM

## 2012-04-26 DIAGNOSIS — M545 Low back pain, unspecified: Secondary | ICD-10-CM | POA: Insufficient documentation

## 2012-04-26 MED ORDER — TAMOXIFEN CITRATE 20 MG PO TABS: 20.0000 mg | ORAL_TABLET | Freq: Every day | ORAL | Status: DC

## 2012-04-26 NOTE — Progress Notes (Signed)
ID: Carolyn Barron   DOB: 04-Jul-1961  MR#: [id number]  YQI#:[id number]  PCP: Carolyn Lank MD GYN:  SU: Carolyn Bouillon MD OTHER MD: Carolyn Barron   HISTORY OF PRESENT ILLNESS: Ms. Barron felt a lump in her left breast after a shower in late October. She brought it to Dr. Marylen Barron attention, and was set up for mammography November 1 at the [facility].  Dr. Deboraha Barron was able to feel some thickening at 1 o'clock in the left breast about 5 cm from the left nipple, and by mammography there was a spiculated mass there corresponding to the palpable finding.  Ultrasound showed this to be irregular, and to measure approximately 1.6 cm.  The left axilla appeared normal.  Biopsy was performed the same day, and showed ([id number]) an invasive mammary carcinoma with lobular features, which was ER+ at 100%, PR+ at 99%, with a low proliferation marker at 10%, and HER2 negative with a ratio of 1.13.   With this information, the patient was referred to Dr. Luisa Barron and breast MRI was obtained November 3.  This showed a large area of non-mass enhancement within the upper-outer and upper-inner quadrants of the left breast, much larger than the abnormality found by physical examination, ultrasound or mammography. The question was then raised whether to proceed to biopsy of the edges of this mass to ascertain for respectability, or to proceed directly to mastectomy, and the patient much preferred the latter, so mastectomy was performed November 12 with sentinel lymph node biopsy, the final pathology showing ([id number]) a 1.8 cm invasive lobular carcinoma, grade 2, with no evidence of lymphovascular invasion and ample margins.  The sentinel lymph node was negative. Her subsequent history is as detailed below.  INTERVAL HISTORY: Carolyn returns today with her husband Carolyn Barron, after missing several routine followups here. On September 29 she presented to the emergency room with right chest pain and low back pain. They obtained a  CT/angio of the chest which showed no pulmonary embolus and no lesions in the bones covered by the scan. It did show a 4 mm right lower lobe nodule, which is the reason she scheduled an appointment here. Unfortunately she is still smoking one pack per day.  REVIEW OF SYSTEMS: The pain in the right chest is a bit better. She has been quite stressed because of all these developments, but she continues to work full-time, and her work involves a fair amount of lifting. Sometimes when she sleeps on her right side she's of can wake up with some pain. Most of the time she falls asleep watching TV. She wakes up in the middle of the night sometimes because of nocturia sometimes because of hot flashes. She has some urinary leakage issues. She keeps a dry cough, but denies purulent sputum or fever. She denies worsening shortness of breath. She does get somewhat winded when she walks up stairs. She can get some chest pressure when stressed. She has some sinus problems and a bit of a runny nose, but no sore throat. The pain in the back is in the lumbar area. It is not made better or worse by walking sitting or standing, and it is very intermittent. She has significant hot flashes on the tamoxifen but no other side effects associated with that medication. A detailed review of systems was otherwise noncontributory.  PAST MEDICAL HISTORY: Past Medical History  Diagnosis Date  . Cancer   . Hypertension   The past medical history is significant for hyperlipidemia, remote history  of kidney stones, history of asthma, history of osteopenia, history of carpal tunnel repair, history of trigger finger repair (both these two by Dr. Teressa Barron), and history of continuing tobacco abuse.    PAST SURGICAL HISTORY: Past Surgical History  Procedure Date  . Mastectomy     FAMILY HISTORY No family history on file. The patient's father died from lung cancer.  He was treated at [facility].  He was a  smoker.  The patient's mother is in fair health (age 35 as of OCT 2013).  The patient has one sister. The only cancer that she knows of in the family is the patient's father's mother's, who had lung cancer. (The patient is a distant cousin of Carolyn Barron, who of course died from breast cancer.)   GYNECOLOGIC HISTORY: She is GX P1, first pregnancy to term at age 50.  Last menstrual period was 2005.  She never took hormone replacement.   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  Her husband, Carolyn Barron, is in [employer] representative.  The patient's daughter, Carolyn Barron, is currently living in [place].  Her husband is in the [employer].  They have three children and one on the way.  The patient attends the [employer] Carolyn Barron is one of the pastors and Carolyn Barron is also working there).     ADVANCED DIRECTIVES: Not in place  HEALTH MAINTENANCE: History  Substance Use Topics  . Smoking status: Current Every Day Smoker  . Smokeless tobacco: Not on file  . Alcohol Use: No     Colonoscopy:  PAP:  Bone density:  Lipid panel:  Allergies  Allergen Reactions  . Clindamycin/Lincomycin Rash    Current Outpatient Prescriptions  Medication Sig Dispense Refill  . LISINOPRIL PO Take by mouth.      . oxyCODONE-acetaminophen (PERCOCET) 5-325 MG per tablet Take 2 tablets by mouth every 6 (six) hours as needed for pain.  20 tablet  0  . PARoxetine (PAXIL) 40 MG tablet Take 40 mg by mouth every morning.      . tamoxifen (NOLVADEX) 20 MG tablet TAKE 1 TABLET BY MOUTH EVERY DAY  90 tablet  0    OBJECTIVE: Middle-aged white woman who appears anxious Filed Vitals:   04/26/12 1432  BP: 149/91  Pulse: 108  Temp: 98.7 F (37.1 C)  Resp: 20     Body mass index is 32.52 kg/(m^2).    ECOG FS: 0  Sclerae unicteric Oropharynx clear No cervical or supraclavicular adenopathy Lungs no rales or  rhonchi Heart regular rate and rhythm Abd benign MSK no focal spinal tenderness, no peripheral edema Neuro: nonfocal Breasts: The right breast is unremarkable. The left breast is status post mastectomy. There is no evidence of local recurrence. The left axilla is benign.  LAB RESULTS: Lab Results  Component Value Date   WBC 6.9 01/05/2011   NEUTROABS 3.3 01/05/2011   HGB 12.4 01/05/2011   HCT 37.0 01/05/2011   MCV 87.8 01/05/2011   PLT 257 01/05/2011      Chemistry      Component Value Date/Time   NA 139 04/16/2012 1521   K 4.5 04/16/2012 1521   CL 103 04/16/2012 1521   CO2 25 04/16/2012 1521   BUN 13 04/16/2012 1521   CREATININE 0.80 04/16/2012 1521      Component Value Date/Time   CALCIUM  9.6 04/16/2012 1521   ALKPHOS 68 01/05/2011 1340   AST 14 01/05/2011 1340   ALT 13 01/05/2011 1340   BILITOT 0.1* 01/05/2011 1340       Lab Results  Component Value Date   LABCA2 14 03/19/2010    No components found with this basename: [id number]    No results found for this basename: INR:1;PROTIME:1 in the last 168 hours  Urinalysis    Component Value Date/Time   COLORURINE YELLOW 04/16/2012 1637   APPEARANCEUR CLOUDY* 04/16/2012 1637   LABSPEC 1.022 04/16/2012 1637   PHURINE 6.0 04/16/2012 1637   GLUCOSEU NEGATIVE 04/16/2012 1637   HGBUR NEGATIVE 04/16/2012 1637   BILIRUBINUR NEGATIVE 04/16/2012 1637   KETONESUR NEGATIVE 04/16/2012 1637   PROTEINUR NEGATIVE 04/16/2012 1637   UROBILINOGEN 0.2 04/16/2012 1637   NITRITE NEGATIVE 04/16/2012 1637   LEUKOCYTESUR NEGATIVE 04/16/2012 1637    STUDIES: Ct Angio Chest Pe W/cm &/or Wo Cm  04/16/2012  *RADIOLOGY REPORT*  Clinical Data: Right shoulder and back pain.  History of breast cancer.  Status post left mastectomy.  CT ANGIOGRAPHY CHEST  Technique:  Multidetector CT imaging of the chest using the standard protocol during bolus administration of intravenous contrast. Multiplanar reconstructed images including MIPs were obtained and reviewed to evaluate  the vascular anatomy.  Contrast: OMNIPAQUE IOHEXOL 350 MG/ML SOLN  Comparison: None.  Findings: There is no filling defect within the opacified pulmonary arteries to suggest the presence of an acute pulmonary embolus.  No thoracic aortic aneurysm.  There is no dissection of the thoracic aorta.  There is a small 4 x 3 mm focus of adherent  atheromatous plaque or thrombus adherent to the lateral wall of the transverse aorta, which extends just out into the lumen.  The heart size is normal.  There is no pericardial or pleural effusion.  No axillary lymphadenopathy.  No supraclavicular or mediastinal lymphadenopathy.  Small lymph nodes are seen in each hilum, but no individual node is enlarged by CT criteria.  Lung windows show a 4 mm right lower lobe pulmonary nodule on image 52.  6 mm pulmonary nodule is seen along the left major fissure on image 46.  This has a triangular shape on sagittal re-formations and probably represents a subpleural lymph node.  Bone windows reveal no worrisome lytic or sclerotic osseous lesions.  IMPRESSION: No CT evidence for acute pulmonary embolus.  No thoracic aortic aneurysm or dissection.  The patient is noted have a tiny filling defect in the transverse aorta which is adherent to the lateral wall.  This may be an area of atheromatous plaque or adherent thrombus.  4 mm right lower lobe pulmonary nodule. If the patient is at high risk for bronchogenic carcinoma, follow-up chest CT at 1 year is recommended.  If the patient is at low risk, no follow-up is needed.  This recommendation follows the consensus statement: Guidelines for Management of Small Pulmonary Nodules Detected on CT Scans:  A Statement from the Fleischner Society as published in Radiology 2005; 237:395-400.  6 mm pulmonary nodule along the lower left major fissure has imaging features suggesting subpleural lymph node.   Original Report Authenticated By: ERIC A. MANSELL, M.D.     ASSESSMENT: 51 y.o. [place]  woman status post left mastectomy and sentinel lymph node sampling in November 2010 for a T1c N0, stage IA invasive lobular carcinoma, grade 2, strongly estrogen and progesterone receptor positive, HER-2/neu negative, with a low proliferation fraction. On tamoxifen since December 2010  PLAN: We  discussed the very small right lower lobe lesion, which is likely a scar. I am concerned because of her continuing smoking, and so we're going to readmit to this in 6 months instead of the 12 months that would be suggested by standard criteria. As far as the low back pain is concerned regarding get some lumbosacral spine films today. She Barron to be careful when lifting at work and it would be helpful if she did some back exercises as well. We again talked about discontinuation of smoking, which she has found essentially impossible so far. Otherwise I will see [place] again in 6 months. The plan as far as breast cancer is concerned is to continue tamoxifen an additional 3 years then reassess. She knows to call for any problems that may develop before the next visit   Kamaile Zachow C    04/26/2012

## 2012-04-26 NOTE — Telephone Encounter (Signed)
gve the pt her April 2014 appt calendar along with the x-ray referral for the lumbar spine

## 2012-05-24 ENCOUNTER — Ambulatory Visit: Payer: 59

## 2012-08-23 ENCOUNTER — Encounter (HOSPITAL_BASED_OUTPATIENT_CLINIC_OR_DEPARTMENT_OTHER): Payer: Self-pay

## 2012-08-23 ENCOUNTER — Emergency Department (HOSPITAL_BASED_OUTPATIENT_CLINIC_OR_DEPARTMENT_OTHER)
Admission: EM | Admit: 2012-08-23 | Discharge: 2012-08-23 | Disposition: A | Discharge status: Home / Self Care | Payer: Self-pay | Attending: Emergency Medicine | Admitting: Emergency Medicine

## 2012-08-23 DIAGNOSIS — Z859 Personal history of malignant neoplasm, unspecified: Secondary | ICD-10-CM | POA: Insufficient documentation

## 2012-08-23 DIAGNOSIS — R42 Dizziness and giddiness: Secondary | ICD-10-CM

## 2012-08-23 DIAGNOSIS — R05 Cough: Secondary | ICD-10-CM | POA: Insufficient documentation

## 2012-08-23 DIAGNOSIS — J069 Acute upper respiratory infection, unspecified: Secondary | ICD-10-CM | POA: Insufficient documentation

## 2012-08-23 DIAGNOSIS — I10 Essential (primary) hypertension: Secondary | ICD-10-CM | POA: Insufficient documentation

## 2012-08-23 DIAGNOSIS — J3489 Other specified disorders of nose and nasal sinuses: Secondary | ICD-10-CM | POA: Insufficient documentation

## 2012-08-23 DIAGNOSIS — J329 Chronic sinusitis, unspecified: Secondary | ICD-10-CM | POA: Insufficient documentation

## 2012-08-23 DIAGNOSIS — F17200 Nicotine dependence, unspecified, uncomplicated: Secondary | ICD-10-CM | POA: Insufficient documentation

## 2012-08-23 DIAGNOSIS — Z79899 Other long term (current) drug therapy: Secondary | ICD-10-CM | POA: Insufficient documentation

## 2012-08-23 DIAGNOSIS — R059 Cough, unspecified: Secondary | ICD-10-CM | POA: Insufficient documentation

## 2012-08-23 HISTORY — DX: Dizziness and giddiness: R42

## 2012-08-23 MED ORDER — AMOXICILLIN 500 MG PO CAPS: 500.0000 mg | ORAL_CAPSULE | Freq: Three times a day (TID) | ORAL | Status: DC

## 2012-08-23 MED ORDER — MECLIZINE HCL 25 MG PO TABS: 25.0000 mg | ORAL_TABLET | Freq: Four times a day (QID) | ORAL | Status: DC

## 2012-08-23 NOTE — ED Notes (Signed)
Pt reports sinus pain, nasal congestion and "light headedness" x 1 week worsening Monday. Hx of vertigo.

## 2012-08-23 NOTE — ED Provider Notes (Signed)
Medical screening examination/treatment/procedure(s) were performed by non-physician practitioner and as supervising physician I was immediately available for consultation/collaboration.   Charles B. Sheldon, MD 08/23/12 1518 

## 2012-08-23 NOTE — ED Notes (Signed)
Pt reports she has a sensation of the room spinning.

## 2012-08-23 NOTE — ED Provider Notes (Signed)
History     CSN: [id number]  Arrival date & time 08/23/12  1036   First MD Initiated Contact with Patient 08/23/12 1205      Chief Complaint  Patient presents with  . Dizziness  . Nasal Congestion    (Consider location/radiation/quality/duration/timing/severity/associated sxs/prior treatment) Patient is a 52 y.o. female presenting with URI. The history is provided by the patient. No language interpreter was used.  URI The primary symptoms include cough. Primary symptoms do not include wheezing. The current episode started more than 1 week ago. This is a new problem. The problem has been gradually worsening.  Associated with: vertigo. Symptoms associated with the illness include sinus pressure, congestion and rhinorrhea. Risk factors: hx of sinus problems.  Pt complains of feeling dizzy.   Pt reports she has had vertigo in the past and this feels the same way.  Past Medical History  Diagnosis Date  . Cancer   . Hypertension   . Vertigo     Past Surgical History  Procedure Date  . Mastectomy   . Carpal tunnel release     No family history on file.  History  Substance Use Topics  . Smoking status: Current Every Day Smoker -- 0.5 packs/day    Types: Cigarettes  . Smokeless tobacco: Not on file  . Alcohol Use: No    OB History    Grav Para Term Preterm Abortions TAB SAB Ect Mult Living                  Review of Systems  HENT: Positive for congestion, rhinorrhea and sinus pressure.   Respiratory: Positive for cough. Negative for wheezing.   Neurological: Positive for dizziness.  All other systems reviewed and are negative.    Allergies  Clindamycin/lincomycin  Home Medications   Current Outpatient Rx  Name  Route  Sig  Dispense  Refill  . AMOXICILLIN 500 MG PO CAPS   Oral   Take 1 capsule (500 mg total) by mouth 3 (three) times daily.   30 capsule   0   . LISINOPRIL PO   Oral   Take by mouth.         Marland Kitchen MECLIZINE HCL 25 MG PO TABS   Oral   Take  1 tablet (25 mg total) by mouth 4 (four) times daily.   28 tablet   0   . OXYCODONE-ACETAMINOPHEN 5-325 MG PO TABS   Oral   Take 2 tablets by mouth every 6 (six) hours as needed for pain.   20 tablet   0   . PAROXETINE HCL 40 MG PO TABS   Oral   Take 40 mg by mouth every morning.         Marland Kitchen TAMOXIFEN CITRATE 20 MG PO TABS   Oral   Take 1 tablet (20 mg total) by mouth daily.   90 tablet   12     BP 116/84  Pulse 90  Temp 98.2 F (36.8 C) (Oral)  Resp 18  Ht 5\' 2"  (1.575 m)  Wt 173 lb (78.472 kg)  BMI 31.64 kg/m2  SpO2 98%  Physical Exam  Nursing note and vitals reviewed. Constitutional: She is oriented to person, place, and time. She appears well-developed and well-nourished.  HENT:  Head: Normocephalic.  Right Ear: External ear normal.  Left Ear: External ear normal.  Mouth/Throat: Oropharynx is clear and moist.  Eyes: Conjunctivae normal are normal. Pupils are equal, round, and reactive to light.  Neck: Normal range of  motion. Neck supple.  Cardiovascular: Normal rate and normal heart sounds.   Pulmonary/Chest: Effort normal and breath sounds normal.  Abdominal: Soft. Bowel sounds are normal.  Musculoskeletal: Normal range of motion.  Neurological: She is alert and oriented to person, place, and time.  Skin: Skin is warm.  Psychiatric: She has a normal mood and affect.    ED Course  Procedures (including critical care time)  Labs Reviewed - No data to display No results found.   1. Sinusitis   2. Vertigo       MDM  I will treat with amoxicillian.   Pt has done well in the past on antivert.   Pt advised to see her MD for recheck.   Return if symptoms worsen or change        Lonia Skinner [place], [place] 08/23/12 1326  Lonia Skinner [place], [place] 08/23/12 1327

## 2012-10-11 ENCOUNTER — Emergency Department (HOSPITAL_BASED_OUTPATIENT_CLINIC_OR_DEPARTMENT_OTHER)
Admission: EM | Admit: 2012-10-11 | Discharge: 2012-10-11 | Disposition: A | Discharge status: Home / Self Care | Payer: Self-pay | Attending: Emergency Medicine | Admitting: Emergency Medicine

## 2012-10-11 ENCOUNTER — Emergency Department (HOSPITAL_BASED_OUTPATIENT_CLINIC_OR_DEPARTMENT_OTHER): Payer: Self-pay

## 2012-10-11 ENCOUNTER — Encounter (HOSPITAL_BASED_OUTPATIENT_CLINIC_OR_DEPARTMENT_OTHER): Payer: Self-pay | Admitting: *Deleted

## 2012-10-11 DIAGNOSIS — J069 Acute upper respiratory infection, unspecified: Secondary | ICD-10-CM | POA: Insufficient documentation

## 2012-10-11 DIAGNOSIS — IMO0001 Reserved for inherently not codable concepts without codable children: Secondary | ICD-10-CM | POA: Insufficient documentation

## 2012-10-11 DIAGNOSIS — F17200 Nicotine dependence, unspecified, uncomplicated: Secondary | ICD-10-CM | POA: Insufficient documentation

## 2012-10-11 DIAGNOSIS — I10 Essential (primary) hypertension: Secondary | ICD-10-CM | POA: Insufficient documentation

## 2012-10-11 DIAGNOSIS — J209 Acute bronchitis, unspecified: Secondary | ICD-10-CM | POA: Insufficient documentation

## 2012-10-11 DIAGNOSIS — H9209 Otalgia, unspecified ear: Secondary | ICD-10-CM | POA: Insufficient documentation

## 2012-10-11 DIAGNOSIS — J40 Bronchitis, not specified as acute or chronic: Secondary | ICD-10-CM

## 2012-10-11 DIAGNOSIS — Z79899 Other long term (current) drug therapy: Secondary | ICD-10-CM | POA: Insufficient documentation

## 2012-10-11 DIAGNOSIS — R062 Wheezing: Secondary | ICD-10-CM | POA: Insufficient documentation

## 2012-10-11 DIAGNOSIS — R6883 Chills (without fever): Secondary | ICD-10-CM | POA: Insufficient documentation

## 2012-10-11 DIAGNOSIS — J3489 Other specified disorders of nose and nasal sinuses: Secondary | ICD-10-CM | POA: Insufficient documentation

## 2012-10-11 DIAGNOSIS — Z853 Personal history of malignant neoplasm of breast: Secondary | ICD-10-CM | POA: Insufficient documentation

## 2012-10-11 HISTORY — DX: Malignant neoplasm of unspecified site of unspecified female breast: C50.919

## 2012-10-11 MED ORDER — AZITHROMYCIN 250 MG PO TABS: 250.0000 mg | ORAL_TABLET | Freq: Every day | ORAL | Status: DC

## 2012-10-11 MED ORDER — ALBUTEROL SULFATE HFA 108 (90 BASE) MCG/ACT IN AERS
2.0000 | INHALATION_SPRAY | RESPIRATORY_TRACT | Status: DC | PRN
  Administered 2012-10-11: 2 via RESPIRATORY_TRACT
  Filled 2012-10-11: qty 6.7

## 2012-10-11 NOTE — ED Provider Notes (Addendum)
History     CSN: [id number]  Arrival date & time 10/11/12  1407   First MD Initiated Contact with Patient 10/11/12 1531      Chief Complaint  Patient presents with  . Cough    (Consider location/radiation/quality/duration/timing/severity/associated sxs/prior treatment) Patient is a 52 y.o. female presenting with cough. The history is provided by the patient.  Cough Cough characteristics:  Non-productive Severity:  Moderate Onset quality:  Gradual Duration:  7 days Timing:  Constant Progression:  Worsening Chronicity:  New Smoker: yes   Context: upper respiratory infection   Relieved by:  Nothing Worsened by:  Activity and lying down Ineffective treatments:  Decongestant, cough suppressants and rest Associated symptoms: chills, ear pain, myalgias, rhinorrhea, sinus congestion and wheezing   Associated symptoms: no chest pain, no fever and no sore throat   Risk factors: no recent infection and no recent travel     Past Medical History  Diagnosis Date  . Cancer   . Hypertension   . Vertigo   . Breast cancer     Past Surgical History  Procedure Laterality Date  . Mastectomy    . Carpal tunnel release      No family history on file.  History  Substance Use Topics  . Smoking status: Current Every Day Smoker -- 0.50 packs/day    Types: Cigarettes  . Smokeless tobacco: Never Used  . Alcohol Use: No    OB History   Grav Para Term Preterm Abortions TAB SAB Ect Mult Living                  Review of Systems  Constitutional: Positive for chills. Negative for fever.  HENT: Positive for ear pain, congestion, rhinorrhea and sinus pressure. Negative for sore throat.   Respiratory: Positive for cough and wheezing.   Cardiovascular: Negative for chest pain.  Gastrointestinal: Negative for nausea, vomiting and abdominal pain.  Musculoskeletal: Positive for myalgias.  All other systems reviewed and are negative.    Allergies  Clindamycin/lincomycin  Home  Medications   Current Outpatient Rx  Name  Route  Sig  Dispense  Refill  . LISINOPRIL PO   Oral   Take by mouth.         . meclizine (ANTIVERT) 25 MG tablet   Oral   Take 1 tablet (25 mg total) by mouth 4 (four) times daily.   28 tablet   0   . PARoxetine (PAXIL) 40 MG tablet   Oral   Take 40 mg by mouth every morning.         . tamoxifen (NOLVADEX) 20 MG tablet   Oral   Take 1 tablet (20 mg total) by mouth daily.   90 tablet   12   . amoxicillin (AMOXIL) 500 MG capsule   Oral   Take 1 capsule (500 mg total) by mouth 3 (three) times daily.   30 capsule   0   . oxyCODONE-acetaminophen (PERCOCET) 5-325 MG per tablet   Oral   Take 2 tablets by mouth every 6 (six) hours as needed for pain.   20 tablet   0     BP 123/69  Pulse 103  Temp(Src) 98.3 F (36.8 C) (Oral)  Resp 18  Ht 5\' 2"  (1.575 m)  Wt 173 lb (78.472 kg)  BMI 31.63 kg/m2  SpO2 93%  Physical Exam  Nursing note and vitals reviewed. Constitutional: She is oriented to person, place, and time. She appears well-developed and well-nourished. No distress.  HENT:  Head: Normocephalic and atraumatic.  Right Ear: Ear canal normal. Tympanic membrane is not injected, not perforated, not erythematous and not retracted.  Left Ear: Tympanic membrane and ear canal normal.  Nose: Mucosal edema and rhinorrhea present. Right sinus exhibits maxillary sinus tenderness. Right sinus exhibits no frontal sinus tenderness. Left sinus exhibits maxillary sinus tenderness. Left sinus exhibits no frontal sinus tenderness.  Mouth/Throat: Oropharynx is clear and moist.  Sterile fluid behind right TM without any signs of infection  Eyes: Conjunctivae and EOM are normal. Pupils are equal, round, and reactive to light.  Neck: Normal range of motion. Neck supple.  Cardiovascular: Normal rate, regular rhythm and intact distal pulses.   No murmur heard. Pulmonary/Chest: Effort normal. No respiratory distress. She has wheezes. She  has rhonchi. She has no rales.  Abdominal: Soft. She exhibits no distension. There is no tenderness. There is no rebound and no guarding.  Musculoskeletal: Normal range of motion. She exhibits no edema and no tenderness.  Lymphadenopathy:    She has no cervical adenopathy.  Neurological: She is alert and oriented to person, place, and time.  Skin: Skin is warm and dry. No rash noted. No erythema.  Psychiatric: She has a normal mood and affect. Her behavior is normal.    ED Course  Procedures (including critical care time)  Labs Reviewed - No data to display Dg Chest 2 View  10/11/2012  *RADIOLOGY REPORT*  Clinical Data: Cough and wheezing.  CHEST - 2 VIEW  Comparison: 04/16/2012  Findings: The heart size and mediastinal contours are within normal limits.  Both lungs are clear.  The visualized skeletal structures are unremarkable.  IMPRESSION: Negative examination.   Original Report Authenticated By: Signa Kell, M.D.      1. URI (upper respiratory infection)   2. Bronchitis       MDM    Pt with symptoms consistent with viral URI.  Well appearing here.  No signs of breathing difficulty, but does c/o of wheezing intermittently and mild rhonchi on exam.  No signs of pharyngitis, otitis or abnormal abdominal findings.   CXR wnl and pt to return with any further problems.  Pt given inhaler and zpack.         Gwyneth Sprout, MD 10/11/12 [id number]  Gwyneth Sprout, MD 10/11/12 [id number]

## 2012-10-11 NOTE — ED Notes (Signed)
Pt reports non-productive cough and congestion x 1 week- also c/o right ear pain

## 2012-10-26 ENCOUNTER — Ambulatory Visit: Payer: 59 | Admitting: Physician Assistant

## 2012-10-26 ENCOUNTER — Other Ambulatory Visit: Payer: 59 | Admitting: Lab

## 2012-10-26 ENCOUNTER — Encounter: Payer: Self-pay | Admitting: Physician Assistant

## 2012-10-26 NOTE — Progress Notes (Signed)
[facility] today.  Letter mailed to patient.   Zollie Scale, PA-C 10/26/2012

## 2013-06-04 ENCOUNTER — Other Ambulatory Visit: Payer: Self-pay | Admitting: Oncology

## 2013-06-04 DIAGNOSIS — C50919 Malignant neoplasm of unspecified site of unspecified female breast: Secondary | ICD-10-CM

## 2013-06-06 ENCOUNTER — Other Ambulatory Visit: Payer: Self-pay | Admitting: Physician Assistant

## 2013-06-06 DIAGNOSIS — C50919 Malignant neoplasm of unspecified site of unspecified female breast: Secondary | ICD-10-CM

## 2013-06-06 MED ORDER — TAMOXIFEN CITRATE 20 MG PO TABS: 20.0000 mg | ORAL_TABLET | Freq: Every day | ORAL | Status: DC

## 2013-06-06 NOTE — Progress Notes (Signed)
Patient was a [facility] for an appointment in April 2014.  I have refilled her tamoxifen today, #30 with no refills, with a note to pharmacy that she needs to see MD before another prescription will be approved.  Zollie Scale, PA-C 06/06/2013

## 2014-08-20 ENCOUNTER — Telehealth: Payer: Self-pay | Admitting: Oncology

## 2014-08-20 NOTE — Telephone Encounter (Signed)
pt cld to make appt w/GM-gave pt time & date

## 2014-09-24 ENCOUNTER — Other Ambulatory Visit: Payer: Self-pay | Admitting: *Deleted

## 2014-09-24 DIAGNOSIS — C50919 Malignant neoplasm of unspecified site of unspecified female breast: Secondary | ICD-10-CM

## 2014-09-25 ENCOUNTER — Encounter: Payer: Self-pay | Admitting: *Deleted

## 2014-09-25 ENCOUNTER — Ambulatory Visit (HOSPITAL_BASED_OUTPATIENT_CLINIC_OR_DEPARTMENT_OTHER): Payer: 59 | Admitting: Oncology

## 2014-09-25 ENCOUNTER — Other Ambulatory Visit (HOSPITAL_BASED_OUTPATIENT_CLINIC_OR_DEPARTMENT_OTHER): Payer: 59

## 2014-09-25 ENCOUNTER — Telehealth: Payer: Self-pay | Admitting: Oncology

## 2014-09-25 VITALS — BP 128/99 | HR 99 | Temp 98.9°F | Resp 18 | Ht 62.0 in | Wt 158.7 lb

## 2014-09-25 DIAGNOSIS — C50919 Malignant neoplasm of unspecified site of unspecified female breast: Secondary | ICD-10-CM

## 2014-09-25 DIAGNOSIS — C50412 Malignant neoplasm of upper-outer quadrant of left female breast: Secondary | ICD-10-CM

## 2014-09-25 DIAGNOSIS — Z170 Estrogen receptor positive status [ER+]: Secondary | ICD-10-CM

## 2014-09-25 LAB — COMPREHENSIVE METABOLIC PANEL (CC13)
ALT: 9 U/L (ref 0–55)
ANION GAP: 10 meq/L (ref 3–11)
AST: 11 U/L (ref 5–34)
Albumin: 3.6 g/dL (ref 3.5–5.0)
Alkaline Phosphatase: 106 U/L (ref 40–150)
BUN: 14.1 mg/dL (ref 7.0–26.0)
CHLORIDE: 105 meq/L (ref 98–109)
CO2: 27 mEq/L (ref 22–29)
CREATININE: 0.9 mg/dL (ref 0.6–1.1)
Calcium: 9.4 mg/dL (ref 8.4–10.4)
EGFR: 73 mL/min/{1.73_m2} — ABNORMAL LOW (ref >90)
Glucose: 88 mg/dl (ref 70–140)
POTASSIUM: 4.2 meq/L (ref 3.5–5.1)
Sodium: 142 mEq/L (ref 136–145)
Total Bilirubin: <0.2 mg/dL (ref 0.20–1.20)
Total Protein: 7.1 g/dL (ref 6.4–8.3)

## 2014-09-25 LAB — CBC WITH DIFFERENTIAL/PLATELET
BASO%: 0.2 % (ref 0.0–2.0)
BASOS ABS: 0 10*3/uL (ref 0.0–0.1)
EOS ABS: 0.1 10*3/uL (ref 0.0–0.5)
EOS%: 0.8 % (ref 0.0–7.0)
HEMATOCRIT: 43.6 % (ref 34.8–46.6)
HEMOGLOBIN: 14.2 g/dL (ref 11.6–15.9)
LYMPH#: 3.4 10*3/uL — AB (ref 0.9–3.3)
LYMPH%: 34.2 % (ref 14.0–49.7)
MCH: 28.7 pg (ref 25.1–34.0)
MCHC: 32.6 g/dL (ref 31.5–36.0)
MCV: 88.1 fL (ref 79.5–101.0)
MONO#: 0.6 10*3/uL (ref 0.1–0.9)
MONO%: 6.4 % (ref 0.0–14.0)
NEUT#: 5.7 10*3/uL (ref 1.5–6.5)
NEUT%: 58.4 % (ref 38.4–76.8)
PLATELETS: 270 10*3/uL (ref 145–400)
RBC: 4.95 10*6/uL (ref 3.70–5.45)
RDW: 14.1 % (ref 11.2–14.5)
WBC: 9.8 10*3/uL (ref 3.9–10.3)

## 2014-09-25 MED ORDER — TAMOXIFEN CITRATE 20 MG PO TABS: 20.0000 mg | ORAL_TABLET | Freq: Every day | ORAL | Status: DC

## 2014-09-25 NOTE — Telephone Encounter (Signed)
per pof to sch pt appt-sch pt mamma-gave pt copy of sch

## 2014-09-25 NOTE — Progress Notes (Signed)
ID: Carolyn Barron   DOB: 1960/09/26  MR#: [id number]  KGM#:[id number]  PCP: Gareth Eagle MD GYN:  SU: Erroll Luna MD OTHER MD: Herbie Baltimore Sypher]   HISTORY OF PRESENT ILLNESS: From the original intake note:  Ms. Konen felt a lump in her left breast after a shower in late October. She brought it to Dr. Eugenio Hoes attention, and was set up for mammography November 1 at the [facility].  Dr. Sadie Haber was able to feel some thickening at 1 o'clock in the left breast about 5 cm from the left nipple, and by mammography there was a spiculated mass there corresponding to the palpable finding.  Ultrasound showed this to be irregular, and to measure approximately 1.6 cm.  The left axilla appeared normal.  Biopsy was performed the same day, and showed ([id number] and [id number]) an invasive mammary carcinoma with lobular features, which was ER+ at 100%, PR+ at 99%, with a low proliferation marker at 10%, and HER2 negative with a ratio of 1.13.   With this information, the patient was referred to Dr. Brantley Stage and breast MRI was obtained November 3.  This showed a large area of non-mass enhancement within the upper-outer and upper-inner quadrants of the left breast, much larger than the abnormality found by physical examination, ultrasound or mammography. The question was then raised whether to proceed to biopsy of the edges of this mass to ascertain for respectability, or to proceed directly to mastectomy, and the patient much preferred the latter, so mastectomy was performed November 12 with sentinel lymph node biopsy, the final pathology showing ([id number]) a 1.8 cm invasive lobular carcinoma, grade 2, with no evidence of lymphovascular invasion and ample margins.  The sentinel lymph node was negative.   Her subsequent history is as detailed below.  INTERVAL HISTORY: Carolyn Barron returns todayfor follow-up of her breast cancer. She did not return after her last appointment here in 2013. She tells me she lost her insurance.  Subsequently her husband died suddenly from a heart attack. She has been under quite a bit of financial strain since then. More recently she was able to enroll in [place] [facility] so she now has some insurance. She still has not been able to start the tamoxifen. She has not had a mammogram inover 3 years.  REVIEW OF SYSTEMS: Unfortunately Carolyn Barron continues to smoke. We discussed the importance of her not doing that particularly since she hopes some day to be able to undergo reconstruction. That simply will not happen while she is smoking. Of course smoking is expensive as well as unhealthy. Aside from that, she has a bump in the left upper arm and right upper thigh that she wants me to look at. She thinks there's been a change in the inferior portion of her right breast. She has some sinus problems. Her dentures don't fit. She is depressed and feels tired all the time. There have been no unusual headaches, visual changes, nausea vomiting or dizziness. A detailed review of systems today was otherwise Noncontributory  PAST MEDICAL HISTORY: Past Medical History  Diagnosis Date  . Cancer   . Hypertension   . Vertigo   . Breast cancer   The past medical history is significant for hyperlipidemia, remote history of kidney stones, history of asthma, history of osteopenia, history of carpal tunnel repair, history of trigger finger repair (both these two by Dr. Daylene Katayama), and history of continuing tobacco abuse.    PAST SURGICAL HISTORY: Past Surgical History  Procedure Laterality Date  .  Mastectomy    . Carpal tunnel release      FAMILY HISTORY No family history on file. The patient's father died from lung cancer.  He was [facility] at [facility].  He was a smoker.  The patient's mother is in fair health (age 58 as of March 2016).  The patient has one sister. The only cancer that she knows of in the family is the patient's father's mother's, who had lung cancer. (The patient is  a distant cousin of Carolyn Barron, who of course died from breast cancer.)   GYNECOLOGIC HISTORY: She is GX P1, first pregnancy to term at age 45.  Last menstrual period was 2005.  She never took hormone replacement.   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    Her husband, Rush Landmark, died suddenly 03-29-13.  The patient's daughter, [name], is currently living in [place].  Her husband is in the [employer].  They have 4 children.  The patient attends the [employer] Peggy[employer] Juba is one of the pastors and Grafton Folk is also working there).     ADVANCED DIRECTIVES: Not in place  HEALTH MAINTENANCE: History  Substance Use Topics  . Smoking status: Current Every Day Smoker -- 0.50 packs/day    Types: Cigarettes  . Smokeless tobacco: Never Used  . Alcohol Use: No     Colonoscopy:  PAP:  Bone density:  Lipid panel:  Allergies  Allergen Reactions  . Clindamycin/Lincomycin Rash    Current Outpatient Prescriptions  Medication Sig Dispense Refill  . amoxicillin (AMOXIL) 500 MG capsule Take 1 capsule (500 mg total) by mouth 3 (three) times daily. 30 capsule 0  . azithromycin (ZITHROMAX) 250 MG tablet Take 1 tablet (250 mg total) by mouth daily. Take first 2 tablets together, then 1 every day until finished. 6 tablet 0  . LISINOPRIL PO Take by mouth.    . meclizine (ANTIVERT) 25 MG tablet Take 1 tablet (25 mg total) by mouth 4 (four) times daily. 28 tablet 0  . oxyCODONE-acetaminophen (PERCOCET) 5-325 MG per tablet Take 2 tablets by mouth every 6 (six) hours as needed for pain. 20 tablet 0  . PARoxetine (PAXIL) 40 MG tablet Take 40 mg by mouth every morning.    . tamoxifen (NOLVADEX) 20 MG tablet Take 1 tablet (20 mg total) by mouth daily. 30 tablet 0   No current facility-administered medications for this visit.    OBJECTIVE: Middle-aged white woman who was tearful during today's visit Filed Vitals:   09/25/14 1549  BP: 128/99  Pulse: 99   Temp: 98.9 F (37.2 C)  Resp: 18     Body mass index is 29.02 kg/(m^2).    ECOG FS: 1  Sclerae unicteric, pupils equal and reactive Oropharynx clear and moist No cervical or supraclavicular adenopathy Lungs no rales or rhonchi Heart regular rate and rhythm Abd soft, nontender, positive bowel sounds MSK no focal spinal tenderness, no upper extremity lymphedema Neuro: nonfocal, well oriented, anxious affect Breasts: I do not feel a well-defined mass in the right breast and there are no skin or nipple changes of concern. The right axilla is benign. The left breast is unremarkable. Skin: The patient is concerned regarding a possible mass in the left upper arm. However I am unable to feel a mass in that area. There may be slight fat irregularity. She's also concerned about a mass in the right anterior thigh. There is a 1 cm  firm rather than soft mass subcutaneously in the right thigh area. This could be a scar or developing cyst. There is no erythema tenderness or other skin change. I do not think this will be a lipoma.  LAB RESULTS: Lab Results  Component Value Date   WBC 9.8 09/25/2014   NEUTROABS 5.7 09/25/2014   HGB 14.2 09/25/2014   HCT 43.6 09/25/2014   MCV 88.1 09/25/2014   PLT 270 09/25/2014      Chemistry      Component Value Date/Time   NA 139 04/16/2012 1521   K 4.5 04/16/2012 1521   CL 103 04/16/2012 1521   CO2 25 04/16/2012 1521   BUN 13 04/16/2012 1521   CREATININE 0.80 04/16/2012 1521      Component Value Date/Time   CALCIUM 9.6 04/16/2012 1521   ALKPHOS 68 01/05/2011 1340   AST 14 01/05/2011 1340   ALT 13 01/05/2011 1340   BILITOT 0.1* 01/05/2011 1340       Lab Results  Component Value Date   LABCA2 14 03/19/2010    No components found for: EXNTZ001  No results for input(s): INR in the last 168 hours.  Urinalysis    Component Value Date/Time   COLORURINE YELLOW 04/16/2012 1637   APPEARANCEUR CLOUDY* 04/16/2012 1637   LABSPEC 1.022 04/16/2012  1637   PHURINE 6.0 04/16/2012 1637   GLUCOSEU NEGATIVE 04/16/2012 1637   HGBUR NEGATIVE 04/16/2012 1637   BILIRUBINUR NEGATIVE 04/16/2012 1637   KETONESUR NEGATIVE 04/16/2012 1637   PROTEINUR NEGATIVE 04/16/2012 1637   UROBILINOGEN 0.2 04/16/2012 1637   NITRITE NEGATIVE 04/16/2012 1637   LEUKOCYTESUR NEGATIVE 04/16/2012 1637    STUDIES: No results found.  ASSESSMENT: 54 y.o. [place] woman status post left mastectomy and sentinel lymph node sampling in November 2010 for a T1c N0, stage IA invasive lobular carcinoma, grade 2, strongly estrogen and progesterone receptor positive, HER-2/neu negative, with a low proliferation fraction.    (1) On tamoxifen starting December 2010, discontinued December 2013 for financial reasons, resumed March 2016  PLAN:  I am glad Asjah is back and I have put in her tamoxifen prescription to [facility]. I also asked our social worker to help her with that and to make sure her current insurance will pay for the mammogram scheduled for next week.  We are going to see her every 6 months for the next year and then yearly until she completes at least 5 years of tamoxifen, preferably 10. She tolerated this well before. She should do equally well now.  Her blood pressure was high today, but this is a very unusual situation. I have asked her to check her blood pressure when she is in a more relaxed place. Otherwise she will follow up on that with her primary care physician  Adlee knows to call for any problems that may develop before her next visit here.  Sadako Cegielski C    09/25/2014

## 2014-09-25 NOTE — Progress Notes (Signed)
[place] Work  Clinical Social Work was referred by Futures trader for assessment of psychosocial needs due to questions about accessing medical care with her new insurance.  Clinical Social Worker met with patient at [facility] to offer support and assess for needs.  Pt had a lapse in insurance coverage, but now has [employer]. She plans to get mammogram done and restarted on tamoxifen per Dr. Jana Hakim. CSW reviewed several medication assistance programs that might be able to help. Pt was also interested in stopping smoking and she plans to look into our smoking cessation classes here. CSW provided handout and schedule for classes. Pt has CSW card and agrees to call if future needs arise. Pt was very appreciative.   Clinical Social Work interventions: Resource education  Loren Racer, [place] Worker [place]  [place] Phone: [phone] Fax: [phone]

## 2014-10-04 ENCOUNTER — Other Ambulatory Visit: Payer: Self-pay | Admitting: Oncology

## 2014-10-04 DIAGNOSIS — Z1231 Encounter for screening mammogram for malignant neoplasm of breast: Secondary | ICD-10-CM

## 2014-10-18 ENCOUNTER — Ambulatory Visit
Admission: RE | Admit: 2014-10-18 | Discharge: 2014-10-18 | Disposition: A | Discharge status: Home / Self Care | Payer: 59 | Source: Ambulatory Visit | Attending: Oncology | Admitting: Oncology

## 2014-10-18 DIAGNOSIS — Z1231 Encounter for screening mammogram for malignant neoplasm of breast: Secondary | ICD-10-CM

## 2014-11-16 ENCOUNTER — Other Ambulatory Visit: Payer: Self-pay | Admitting: Oncology

## 2015-03-31 ENCOUNTER — Other Ambulatory Visit: Payer: Self-pay | Admitting: *Deleted

## 2015-03-31 DIAGNOSIS — C50919 Malignant neoplasm of unspecified site of unspecified female breast: Secondary | ICD-10-CM

## 2015-04-01 ENCOUNTER — Ambulatory Visit: Payer: 59 | Admitting: Nurse Practitioner

## 2015-04-01 ENCOUNTER — Other Ambulatory Visit: Payer: 59

## 2015-04-09 ENCOUNTER — Other Ambulatory Visit: Payer: 59

## 2015-04-09 ENCOUNTER — Ambulatory Visit: Payer: 59 | Admitting: Nurse Practitioner

## 2020-09-22 ENCOUNTER — Other Ambulatory Visit: Payer: Self-pay | Admitting: Ophthalmology

## 2020-09-22 DIAGNOSIS — H4902 Third [oculomotor] nerve palsy, left eye: Secondary | ICD-10-CM

## 2020-09-29 ENCOUNTER — Other Ambulatory Visit: Payer: Self-pay | Admitting: Ophthalmology

## 2020-09-29 DIAGNOSIS — H4902 Third [oculomotor] nerve palsy, left eye: Secondary | ICD-10-CM

## 2020-10-01 ENCOUNTER — Other Ambulatory Visit: Payer: Self-pay | Admitting: Ophthalmology

## 2020-10-01 ENCOUNTER — Ambulatory Visit
Admission: RE | Admit: 2020-10-01 | Discharge: 2020-10-01 | Disposition: A | Discharge status: Home / Self Care | Payer: 59 | Source: Ambulatory Visit | Attending: Ophthalmology | Admitting: Ophthalmology

## 2020-10-01 DIAGNOSIS — H4902 Third [oculomotor] nerve palsy, left eye: Secondary | ICD-10-CM

## 2020-11-07 ENCOUNTER — Other Ambulatory Visit: Payer: Self-pay | Admitting: Radiation Therapy

## 2020-11-10 ENCOUNTER — Telehealth: Payer: Self-pay | Admitting: Oncology

## 2020-11-10 NOTE — Telephone Encounter (Signed)
Received a call from Mont Dutton, RN for neuro navigator wanting to get [name] re-established w/Dr. Jana Hakim for hx of breast cancer. Per Manuela Schwartz, Dr. Kathyrn Sheriff has made this referral. She will notify the pt of the appt date and time.

## 2020-11-11 ENCOUNTER — Other Ambulatory Visit: Payer: Self-pay | Admitting: Neurosurgery

## 2020-11-12 ENCOUNTER — Other Ambulatory Visit: Payer: Self-pay | Admitting: Neurosurgery

## 2020-11-12 DIAGNOSIS — C799 Secondary malignant neoplasm of unspecified site: Secondary | ICD-10-CM

## 2020-11-14 ENCOUNTER — Ambulatory Visit
Admission: RE | Admit: 2020-11-14 | Discharge: 2020-11-14 | Disposition: A | Discharge status: Home / Self Care | Payer: 59 | Source: Ambulatory Visit | Attending: Neurosurgery | Admitting: Neurosurgery

## 2020-11-14 DIAGNOSIS — C799 Secondary malignant neoplasm of unspecified site: Secondary | ICD-10-CM

## 2020-11-14 MED ORDER — IOPAMIDOL (ISOVUE-370) INJECTION 76%
60.0000 mL | Freq: Once | INTRAVENOUS | Status: AC | PRN
  Administered 2020-11-14: 60 mL via INTRAVENOUS

## 2020-11-17 ENCOUNTER — Inpatient Hospital Stay: Payer: 59

## 2020-11-17 DIAGNOSIS — R978 Other abnormal tumor markers: Secondary | ICD-10-CM | POA: Insufficient documentation

## 2020-11-17 DIAGNOSIS — R634 Abnormal weight loss: Secondary | ICD-10-CM | POA: Insufficient documentation

## 2020-11-17 DIAGNOSIS — C771 Secondary and unspecified malignant neoplasm of intrathoracic lymph nodes: Secondary | ICD-10-CM | POA: Insufficient documentation

## 2020-11-17 DIAGNOSIS — R180 Malignant ascites: Secondary | ICD-10-CM | POA: Insufficient documentation

## 2020-11-17 DIAGNOSIS — R630 Anorexia: Secondary | ICD-10-CM | POA: Insufficient documentation

## 2020-11-17 DIAGNOSIS — M8580 Other specified disorders of bone density and structure, unspecified site: Secondary | ICD-10-CM | POA: Insufficient documentation

## 2020-11-17 DIAGNOSIS — Z9012 Acquired absence of left breast and nipple: Secondary | ICD-10-CM | POA: Insufficient documentation

## 2020-11-17 DIAGNOSIS — E785 Hyperlipidemia, unspecified: Secondary | ICD-10-CM | POA: Insufficient documentation

## 2020-11-17 DIAGNOSIS — Z79899 Other long term (current) drug therapy: Secondary | ICD-10-CM | POA: Insufficient documentation

## 2020-11-17 DIAGNOSIS — R970 Elevated carcinoembryonic antigen [CEA]: Secondary | ICD-10-CM | POA: Insufficient documentation

## 2020-11-17 DIAGNOSIS — C7931 Secondary malignant neoplasm of brain: Secondary | ICD-10-CM | POA: Insufficient documentation

## 2020-11-17 DIAGNOSIS — Z170 Estrogen receptor positive status [ER+]: Secondary | ICD-10-CM | POA: Insufficient documentation

## 2020-11-17 DIAGNOSIS — Z79811 Long term (current) use of aromatase inhibitors: Secondary | ICD-10-CM | POA: Insufficient documentation

## 2020-11-17 DIAGNOSIS — F17210 Nicotine dependence, cigarettes, uncomplicated: Secondary | ICD-10-CM | POA: Insufficient documentation

## 2020-11-17 DIAGNOSIS — C50919 Malignant neoplasm of unspecified site of unspecified female breast: Secondary | ICD-10-CM | POA: Insufficient documentation

## 2020-11-17 DIAGNOSIS — I10 Essential (primary) hypertension: Secondary | ICD-10-CM | POA: Insufficient documentation

## 2020-11-17 DIAGNOSIS — R971 Elevated cancer antigen 125 [CA 125]: Secondary | ICD-10-CM | POA: Insufficient documentation

## 2020-11-17 NOTE — Progress Notes (Incomplete)
Histology and Location of Primary Cancer:  Malignant neoplasm of LEFT breast, estrogen receptor positive   Location(s) of Symptomatic tumor(s):  MRI Head w and w/o Contrast  10/01/2020 --IMPRESSION: 1. 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 2. Otherwise unremarkable intracranial MRA.  Past/Anticipated chemotherapy by medical oncology, if any:  Was under the care of Dr. Luretha Murphy for breast cancer:  09/25/2014 (1) On tamoxifen starting December 2010, discontinued December 2013 for financial reasons, resumed March 2016 --Scheduled to see Dr. 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 Consuella Lose: (10/20/2020) Briefly, the patient tells me that she noticed her left eye drooping about two months ago without any identifiable inciting event. There is no associated trauma. In addition to her eyelid drooping, she noted double vision. She was seen by her eye doctor and referred to the emergency department where MRA revealed possible small aneurysm. She was therefore referred to me for further evaluation  Pain on a scale of 0-10 is: ***   Ambulatory status? Walker? Wheelchair?: ***  SAFETY ISSUES:  Prior radiation? ***  Pacemaker/ICD? ***  Possible current pregnancy? No--postmenopausal  Is the patient on methotrexate? ***  Additional Complaints / other details:  ***

## 2020-11-18 ENCOUNTER — Ambulatory Visit: Payer: 59 | Admitting: Radiation Oncology

## 2020-11-18 ENCOUNTER — Ambulatory Visit: Payer: 59

## 2020-11-19 ENCOUNTER — Other Ambulatory Visit: Payer: Self-pay

## 2020-11-19 ENCOUNTER — Inpatient Hospital Stay (HOSPITAL_COMMUNITY)
Admission: EM | Admit: 2020-11-19 | Discharge: 2020-11-21 | DRG: 181 | Disposition: A | Discharge status: Home / Self Care | Payer: 59 | Attending: Internal Medicine | Admitting: Internal Medicine

## 2020-11-19 ENCOUNTER — Encounter (HOSPITAL_COMMUNITY): Payer: Self-pay

## 2020-11-19 DIAGNOSIS — F32A Depression, unspecified: Secondary | ICD-10-CM

## 2020-11-19 DIAGNOSIS — C786 Secondary malignant neoplasm of retroperitoneum and peritoneum: Secondary | ICD-10-CM | POA: Diagnosis present

## 2020-11-19 DIAGNOSIS — R1084 Generalized abdominal pain: Secondary | ICD-10-CM | POA: Diagnosis not present

## 2020-11-19 DIAGNOSIS — Z853 Personal history of malignant neoplasm of breast: Secondary | ICD-10-CM

## 2020-11-19 DIAGNOSIS — E538 Deficiency of other specified B group vitamins: Secondary | ICD-10-CM | POA: Diagnosis present

## 2020-11-19 DIAGNOSIS — F17210 Nicotine dependence, cigarettes, uncomplicated: Secondary | ICD-10-CM | POA: Diagnosis present

## 2020-11-19 DIAGNOSIS — R1909 Other intra-abdominal and pelvic swelling, mass and lump: Secondary | ICD-10-CM | POA: Diagnosis present

## 2020-11-19 DIAGNOSIS — E785 Hyperlipidemia, unspecified: Secondary | ICD-10-CM | POA: Diagnosis present

## 2020-11-19 DIAGNOSIS — R180 Malignant ascites: Secondary | ICD-10-CM | POA: Diagnosis not present

## 2020-11-19 DIAGNOSIS — Z9012 Acquired absence of left breast and nipple: Secondary | ICD-10-CM

## 2020-11-19 DIAGNOSIS — N189 Chronic kidney disease, unspecified: Secondary | ICD-10-CM

## 2020-11-19 DIAGNOSIS — N9489 Other specified conditions associated with female genital organs and menstrual cycle: Secondary | ICD-10-CM | POA: Diagnosis not present

## 2020-11-19 DIAGNOSIS — C799 Secondary malignant neoplasm of unspecified site: Secondary | ICD-10-CM

## 2020-11-19 DIAGNOSIS — D649 Anemia, unspecified: Secondary | ICD-10-CM | POA: Diagnosis not present

## 2020-11-19 DIAGNOSIS — I671 Cerebral aneurysm, nonruptured: Secondary | ICD-10-CM | POA: Diagnosis present

## 2020-11-19 DIAGNOSIS — C781 Secondary malignant neoplasm of mediastinum: Secondary | ICD-10-CM | POA: Diagnosis not present

## 2020-11-19 DIAGNOSIS — I10 Essential (primary) hypertension: Secondary | ICD-10-CM | POA: Diagnosis present

## 2020-11-19 DIAGNOSIS — C7989 Secondary malignant neoplasm of other specified sites: Secondary | ICD-10-CM | POA: Diagnosis present

## 2020-11-19 DIAGNOSIS — J90 Pleural effusion, not elsewhere classified: Secondary | ICD-10-CM

## 2020-11-19 DIAGNOSIS — Z881 Allergy status to other antibiotic agents status: Secondary | ICD-10-CM

## 2020-11-19 DIAGNOSIS — R188 Other ascites: Secondary | ICD-10-CM | POA: Diagnosis present

## 2020-11-19 DIAGNOSIS — R590 Localized enlarged lymph nodes: Secondary | ICD-10-CM

## 2020-11-19 DIAGNOSIS — K219 Gastro-esophageal reflux disease without esophagitis: Secondary | ICD-10-CM

## 2020-11-19 DIAGNOSIS — N179 Acute kidney failure, unspecified: Secondary | ICD-10-CM | POA: Diagnosis not present

## 2020-11-19 DIAGNOSIS — Z79899 Other long term (current) drug therapy: Secondary | ICD-10-CM

## 2020-11-19 DIAGNOSIS — E860 Dehydration: Secondary | ICD-10-CM | POA: Diagnosis present

## 2020-11-19 DIAGNOSIS — R911 Solitary pulmonary nodule: Secondary | ICD-10-CM

## 2020-11-19 DIAGNOSIS — D638 Anemia in other chronic diseases classified elsewhere: Secondary | ICD-10-CM | POA: Diagnosis present

## 2020-11-19 DIAGNOSIS — D75839 Thrombocytosis, unspecified: Secondary | ICD-10-CM | POA: Diagnosis present

## 2020-11-19 DIAGNOSIS — Z20822 Contact with and (suspected) exposure to covid-19: Secondary | ICD-10-CM | POA: Diagnosis present

## 2020-11-19 LAB — TYPE AND SCREEN
ABO/RH(D): A POS
Antibody Screen: NEGATIVE

## 2020-11-19 LAB — COMPREHENSIVE METABOLIC PANEL
ALT: 10 U/L (ref 0–44)
AST: 41 U/L (ref 15–41)
Albumin: 2.7 g/dL — ABNORMAL LOW (ref 3.5–5.0)
Alkaline Phosphatase: 183 U/L — ABNORMAL HIGH (ref 38–126)
Anion gap: 11 (ref 5–15)
BUN: 20 mg/dL (ref 8–23)
CO2: 24 mmol/L (ref 22–32)
Calcium: 8.8 mg/dL — ABNORMAL LOW (ref 8.9–10.3)
Chloride: 102 mmol/L (ref 98–111)
Creatinine, Ser: 1.3 mg/dL — ABNORMAL HIGH (ref 0.44–1.00)
GFR, Estimated: 47 mL/min — ABNORMAL LOW (ref >60)
Glucose, Bld: 103 mg/dL — ABNORMAL HIGH (ref 70–99)
Potassium: 4.2 mmol/L (ref 3.5–5.1)
Sodium: 137 mmol/L (ref 135–145)
Total Bilirubin: 0.3 mg/dL (ref 0.3–1.2)
Total Protein: 6.6 g/dL (ref 6.5–8.1)

## 2020-11-19 LAB — CBC WITH DIFFERENTIAL/PLATELET
Abs Immature Granulocytes: 0.16 10*3/uL — ABNORMAL HIGH (ref 0.00–0.07)
Basophils Absolute: 0 10*3/uL (ref 0.0–0.1)
Basophils Relative: 1 %
Eosinophils Absolute: 0 10*3/uL (ref 0.0–0.5)
Eosinophils Relative: 0 %
HCT: 34.9 % — ABNORMAL LOW (ref 36.0–46.0)
Hemoglobin: 10.7 g/dL — ABNORMAL LOW (ref 12.0–15.0)
Immature Granulocytes: 2 %
Lymphocytes Relative: 15 %
Lymphs Abs: 1.4 10*3/uL (ref 0.7–4.0)
MCH: 26.8 pg (ref 26.0–34.0)
MCHC: 30.7 g/dL (ref 30.0–36.0)
MCV: 87.5 fL (ref 80.0–100.0)
Monocytes Absolute: 0.6 10*3/uL (ref 0.1–1.0)
Monocytes Relative: 7 %
Neutro Abs: 6.5 10*3/uL (ref 1.7–7.7)
Neutrophils Relative %: 75 %
Platelets: 524 10*3/uL — ABNORMAL HIGH (ref 150–400)
RBC: 3.99 MIL/uL (ref 3.87–5.11)
RDW: 16.1 % — ABNORMAL HIGH (ref 11.5–15.5)
WBC: 8.7 10*3/uL (ref 4.0–10.5)
nRBC: 0 % (ref 0.0–0.2)

## 2020-11-19 LAB — LIPASE, BLOOD: Lipase: 44 U/L (ref 11–51)

## 2020-11-19 MED ORDER — SODIUM CHLORIDE 0.9 % IV BOLUS
500.0000 mL | Freq: Once | INTRAVENOUS | Status: AC
  Administered 2020-11-19: 500 mL via INTRAVENOUS

## 2020-11-19 NOTE — ED Notes (Signed)
ED TO INPATIENT HANDOFF REPORT  Name/Age/Gender Carolyn Barron 60 y.o. female  Code Status    Code Status Orders  (From admission, onward)         Start     Ordered   11/19/20 2249  Full code  Continuous        11/19/20 2249        Code Status History    This patient has a current code status but no historical code status.   Advance Care Planning Activity      Home/SNF/Other Home  Chief Complaint Ascites [R18.8]  Level of Care/Admitting Diagnosis ED Disposition    ED Disposition Condition Comment   Admit  Hospital Area: [place] [[id number]]  Level of Care: Telemetry [5]  Admit to tele based on following criteria: Other see comments  Comments: rate-control  Covid Evaluation: Asymptomatic Screening Protocol (No Symptoms)  Diagnosis: Ascites [706237]  Admitting Physician: Orene Desanctis [[id number]]  Attending Physician: Orene Desanctis [[id number]]       Medical History Past Medical History:  Diagnosis Date  . Breast cancer ([place])   . Cancer ([place])   . Hypertension   . Vertigo     Allergies Allergies  Allergen Reactions  . Clindamycin/Lincomycin Rash    IV Location/Drains/Wounds Patient Lines/Drains/Airways Status    Active Line/Drains/Airways    Name Placement date Placement time Site Days   Peripheral IV 11/19/20 Right;Anterior;Medial Antecubital 11/19/20  1946  Antecubital  less than 1          Labs/Imaging Results for orders placed or performed during the hospital encounter of 11/19/20 (from the past 48 hour(s))  Type and screen [place]     Status: None   Collection Time: 11/19/20  6:27 PM  Result Value Ref Range   ABO/RH(D) A POS    Antibody Screen NEG    Sample Expiration      11/22/2020,2359 Performed at [facility], [place] [address], [place], [place] [address]   Comprehensive metabolic panel     Status: Abnormal   Collection Time: 11/19/20  7:50 PM  Result Value Ref Range   Sodium  137 135 - 145 mmol/L   Potassium 4.2 3.5 - 5.1 mmol/L   Chloride 102 98 - 111 mmol/L   CO2 24 22 - 32 mmol/L   Glucose, Bld 103 (H) 70 - 99 mg/dL    Comment: Glucose reference range applies only to samples taken after fasting for at least 8 hours.   BUN 20 8 - 23 mg/dL   Creatinine, Ser 1.30 (H) 0.44 - 1.00 mg/dL   Calcium 8.8 (L) 8.9 - 10.3 mg/dL   Total Protein 6.6 6.5 - 8.1 g/dL   Albumin 2.7 (L) 3.5 - 5.0 g/dL   AST 41 15 - 41 U/L   ALT 10 0 - 44 U/L   Alkaline Phosphatase 183 (H) 38 - 126 U/L   Total Bilirubin 0.3 0.3 - 1.2 mg/dL   GFR, Estimated 47 (L) >60 mL/min    Comment: (NOTE) Calculated using the CKD-EPI Creatinine Equation (2021)    Anion gap 11 5 - 15    Comment: Performed at [facility], [place] [address] [place], [place] [address]  Lipase, blood     Status: None   Collection Time: 11/19/20  7:50 PM  Result Value Ref Range   Lipase 44 11 - 51 U/L    Comment: Performed at [facility], [place] [address] [place], [place] [address]  CBC with Differential     Status: Abnormal   Collection Time: 11/19/20  7:50 PM  Result Value Ref Range   WBC 8.7 4.0 - 10.5 K/uL   RBC 3.99 3.87 - 5.11 MIL/uL   Hemoglobin 10.7 (L) 12.0 - 15.0 g/dL   HCT 34.9 (L) 36.0 - 46.0 %   MCV 87.5 80.0 - 100.0 fL   MCH 26.8 26.0 - 34.0 pg   MCHC 30.7 30.0 - 36.0 g/dL   RDW 16.1 (H) 11.5 - 15.5 %   Platelets 524 (H) 150 - 400 K/uL   nRBC 0.0 0.0 - 0.2 %   Neutrophils Relative % 75 %   Neutro Abs 6.5 1.7 - 7.7 K/uL   Lymphocytes Relative 15 %   Lymphs Abs 1.4 0.7 - 4.0 K/uL   Monocytes Relative 7 %   Monocytes Absolute 0.6 0.1 - 1.0 K/uL   Eosinophils Relative 0 %   Eosinophils Absolute 0.0 0.0 - 0.5 K/uL   Basophils Relative 1 %   Basophils Absolute 0.0 0.0 - 0.1 K/uL   Immature Granulocytes 2 %   Abs Immature Granulocytes 0.16 (H) 0.00 - 0.07 K/uL    Comment: Performed at [facility], [place] [address] [place],  [address]    No results found.  Pending Labs Unresulted Labs (From admission, onward)          Start     Ordered   11/20/20 2122  Basic metabolic panel  Tomorrow morning,   R        11/19/20 2249   11/20/20 0500  CBC  Tomorrow morning,   R        11/19/20 2249   11/19/20 2114  SARS CORONAVIRUS 2 (TAT 6-24 HRS) Nasopharyngeal Nasopharyngeal Swab  (Tier 3 - Symptomatic/asymptomatic)  Once,   STAT       Question Answer Comment  Is this test for diagnosis or screening Screening   Symptomatic for COVID-19 as defined by CDC No   Hospitalized for COVID-19 No   Admitted to ICU for COVID-19 No   Previously tested for COVID-19 No   Resident in a congregate (group) care setting No   Employed in healthcare setting No   Pregnant No   Has patient completed COVID vaccination(s) (2 doses of Pfizer/Moderna 1 dose of [employer]) Unknown      11/19/20 2114          Vitals/Pain Today's Vitals   11/19/20 2200 11/19/20 2215 11/19/20 2230 11/19/20 2246  BP: 120/68 132/78 132/78 132/78  Pulse: (!) 104 (!) 102  (!) 104  Resp: (!) 25 (!) 23 (!) 25   Temp:      TempSrc:      SpO2: 94% 94%  96%    Isolation Precautions No active isolations  Medications Medications  sodium chloride 0.9 % bolus 500 mL (0 mLs Intravenous Stopped 11/19/20 2323)    Mobility walks

## 2020-11-19 NOTE — ED Provider Notes (Signed)
Emergency Medicine Provider Triage Evaluation Note  Carolyn Barron , a 60 y.o. female  was evaluated in triage.  Pt complains of dark stools, abdominal pain and distention.  Patient has noticed dark stools and is concerned for blood in her stool for about a week now.  She also reports that over the past week and a half her abdomen has become increasingly distended with general pain.  Reports associated vomiting and diarrhea.  No known history of liver disease, denies alcohol use.  Has known history of breast cancer and is being evaluated with concern for new cancer or spread of cancer.  Review of Systems  Positive: Abdominal pain, abdominal distention, melena, vomiting, diarrhea Negative: Fever  Physical Exam  BP (!) 125/96 (BP Location: Right Arm)   Pulse (!) 119   Temp 98.6 F (37 C) (Oral)   Resp 17   SpO2 98%  Gen:   Awake, no distress   Resp:  Normal effort  MSK:   Moves extremities without difficulty  Other:  Abdomen is very distended and tympanic with mild generalized tenderness.  Medical Decision Making  Medically screening exam initiated at 6:15 PM.  Appropriate orders placed.  ENIS LEATHERWOOD was informed that the remainder of the evaluation will be completed by another provider, this initial triage assessment does not replace that evaluation, and the importance of remaining in the ED until their evaluation is complete.     Jacqlyn Larsen, PA-C 11/19/20 Bosie Helper    Wyvonnia Dusky, MD 11/20/20 [phone]

## 2020-11-19 NOTE — ED Triage Notes (Addendum)
Pt BIB EMS for abdominal pain for last 2 weeks. Pt c/o her stomach being distended. EMS reports stomach is tender to touch. Pt has been diagnosed with stomach ulcers, pt c/o dark stools for last few 2 weeks. Pt has restricted left arm due to previous mastectomy. EMS reports inspiratory and expiratory wheezes. VSS.  96% RA

## 2020-11-19 NOTE — ED Provider Notes (Signed)
[facility] DEPT Provider Note   CSN: [id number] Arrival date & time: 11/19/20  1804     History Chief Complaint  Patient presents with  . Abdominal Pain  . Melena    Carolyn Barron is a 60 y.o. female.  Patient is a 60 year old female with a history of prior breast cancer, hypertension and vertigo who presents with abdominal pain and distention.  About a month and a half ago she started noticing some double vision.  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  She was referred to Dr. Kathyrn Sheriff.  Dr. Kathyrn Sheriff did not feel like the aneurysm was related to her complaint.  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  Per the patient, she had a second MRI with contrast which noted a tumor behind the left eye.  She then had a CT scan of her chest abdomen and pelvis ordered by Dr. Kathyrn Sheriff.  She got those results today and was noted to have metastatic disease in her abdomen.  She notes a 2-week history of worsening pain and distention in her abdomen.  She says she cannot really keep anything down because every time she tries to eat or drink she gags it back up.  She also notes that she has had loose stools about 2 to 3/day.  This is also been going on about 2 weeks.  She denies any known fevers.  No urinary symptoms.  She has had a little bit of increased fatigue.  No chest pain or shortness of breath.        Past Medical History:  Diagnosis Date  . Breast cancer ([place])   . Cancer ([place])   . Hypertension   . Vertigo     Patient Active Problem List   Diagnosis Date Noted  . Breast cancer ([place]) 04/26/2012    Past Surgical History:  Procedure Laterality Date  . CARPAL TUNNEL RELEASE    . MASTECTOMY       OB History   No obstetric history on file.     History reviewed. No pertinent family history.  Social History   Tobacco Use  . Smoking status: Current Every Day Smoker    Packs/day: 0.50    Types: Cigarettes  . Smokeless  tobacco: Never Used  Substance Use Topics  . Alcohol use: No  . Drug use: No    Home Medications Prior to Admission medications   Medication Sig Start Date End Date Taking? Authorizing Provider  acetaminophen (TYLENOL) 500 MG tablet Take 500 mg by mouth every 6 (six) hours as needed for moderate pain.   Yes [provider]  albuterol (VENTOLIN HFA) 108 (90 Base) MCG/ACT inhaler Inhale 2 puffs into the lungs every 6 (six) hours as needed for wheezing or shortness of breath.   Yes [provider]  atorvastatin (LIPITOR) 20 MG tablet Take 20 mg by mouth daily. 11/16/20  Yes [provider]  cholecalciferol (VITAMIN D3) 25 MCG (1000 UNIT) tablet Take 1,000 Units by mouth daily.   Yes [provider]  famotidine (PEPCID) 20 MG tablet Take 20 mg by mouth daily. 11/17/20  Yes [provider]  Loratadine 10 MG CAPS Take 1 capsule by mouth daily as needed.   Yes [provider]  pantoprazole (PROTONIX) 20 MG tablet Take 20 mg by mouth daily.   Yes [provider]  PARoxetine (PAXIL) 40 MG tablet Take 40 mg by mouth every morning.   Yes [provider]  tamoxifen (NOLVADEX) 20 MG tablet TAKE ONE TABLET BY MOUTH ONCE DAILY Patient not taking: No sig reported 11/18/14   Magrinat, Virgie Dad, MD    Allergies    Clindamycin/lincomycin  Review of Systems   Review of Systems  Constitutional: Positive for fatigue. Negative for chills, diaphoresis and fever.  HENT: Negative for congestion, rhinorrhea and sneezing.   Eyes: Negative.   Respiratory: Negative for cough, chest tightness and shortness of breath.   Cardiovascular: Negative for chest pain and leg swelling.  Gastrointestinal: Positive for abdominal distention, abdominal pain, diarrhea, nausea and vomiting. Negative for blood in stool.  Genitourinary: Negative for difficulty urinating, flank pain, frequency and hematuria.  Musculoskeletal: Negative for arthralgias and back pain.   Skin: Negative for rash.  Neurological: Negative for dizziness, speech difficulty, weakness, numbness and headaches.    Physical Exam Updated Vital Signs BP 113/68   Pulse (!) 107   Temp 98.6 F (37 C) (Oral)   Resp (!) 24   SpO2 95%   Physical Exam Constitutional:      Appearance: She is well-developed.  HENT:     Head: Normocephalic and atraumatic.  Eyes:     Pupils: Pupils are equal, round, and reactive to light.  Cardiovascular:     Rate and Rhythm: Normal rate and regular rhythm.     Heart sounds: Normal heart sounds.  Pulmonary:     Effort: Pulmonary effort is normal. No respiratory distress.     Breath sounds: Normal breath sounds. No wheezing or rales.  Chest:     Chest wall: No tenderness.  Abdominal:     General: Abdomen is protuberant. Bowel sounds are normal. There is distension.     Palpations: Abdomen is soft.     Tenderness: There is generalized abdominal tenderness. There is no guarding or rebound.     Comments: Dull to percussion  Musculoskeletal:        General: Normal range of motion.     Cervical back: Normal range of motion and neck supple.     Comments: No significant edema or calf tenderness  Lymphadenopathy:     Cervical: No cervical adenopathy.  Skin:    General: Skin is warm and dry.     Findings: No rash.  Neurological:     Mental Status: She is alert and oriented to person, place, and time.     ED Results / Procedures / Treatments   Labs (all labs ordered are listed, but only abnormal results are displayed) Labs Reviewed  COMPREHENSIVE METABOLIC PANEL - Abnormal; Notable for the following components:      Result Value   Glucose, Bld 103 (*)    Creatinine, Ser 1.30 (*)    Calcium 8.8 (*)    Albumin 2.7 (*)    Alkaline Phosphatase 183 (*)    GFR, Estimated 47 (*)    All other components within normal limits  CBC WITH DIFFERENTIAL/PLATELET - Abnormal; Notable for the following components:   Hemoglobin 10.7 (*)    HCT 34.9 (*)     RDW 16.1 (*)    Platelets 524 (*)    Abs Immature Granulocytes 0.16 (*)    All other components within normal limits  SARS CORONAVIRUS 2 (TAT 6-24 HRS)  LIPASE, BLOOD  TYPE AND SCREEN    EKG EKG Interpretation  Date/Time:  Wednesday Nov 19 2020 19:33:19 EDT Ventricular Rate:  109 PR Interval:  116 QRS Duration: 83 QT Interval:  271 QTC Calculation: 365 R Axis:   97  Text Interpretation: Sinus tachycardia Right axis deviation Borderline repolarization abnormality mild ST depression diffusely Confirmed by Malvin Johns [phone]) on 11/19/2020 9:06:38 PM   Radiology No results found.  Procedures Procedures   Medications Ordered in ED Medications  sodium chloride 0.9 % bolus 500 mL (has no administration in time range)    ED Course  I have reviewed the triage vital signs and the nursing notes.  Pertinent labs & imaging results that were available during my care of the patient were reviewed by me and considered in my medical decision making (see chart for details).    MDM Rules/Calculators/A&P                          Patient is a 60 year old female who presents with abdominal distention associated with nausea and vomiting.  She appears to be likely a bit dehydrated.  She is mildly tachycardic.  She has evidence of acute kidney injury.  I reviewed her chart and she had a CT scan that showed metastatic lesions and a large volume ascites.  I was not able to pull up the brain MRI that showed the tumor behind her eye.  She states that this was done at Dr. Cleotilde Neer office in the MRI trailer.  Given her ongoing symptoms and tachycardia with acute kidney injury, I feel that she would benefit from IV fluids and admission.  She may need a paracentesis for symptomatic relief and diagnostic evaluation.  I spoke with Dr. Flossie Buffy who will admit the patient for further treatment. Final Clinical Impression(s) / ED Diagnoses Final diagnoses:  Metastatic malignant neoplasm, unspecified site ([place])   Dehydration  AKI (acute kidney injury) ([place])  Malignant ascites    Rx / DC Orders ED Discharge Orders    None       Malvin Johns, MD 11/19/20 2138

## 2020-11-20 ENCOUNTER — Encounter: Payer: Self-pay | Admitting: Oncology

## 2020-11-20 ENCOUNTER — Encounter (HOSPITAL_COMMUNITY): Payer: Self-pay | Admitting: Family Medicine

## 2020-11-20 ENCOUNTER — Observation Stay (HOSPITAL_COMMUNITY): Payer: 59

## 2020-11-20 DIAGNOSIS — N179 Acute kidney failure, unspecified: Secondary | ICD-10-CM

## 2020-11-20 DIAGNOSIS — Z79899 Other long term (current) drug therapy: Secondary | ICD-10-CM | POA: Diagnosis not present

## 2020-11-20 DIAGNOSIS — Z170 Estrogen receptor positive status [ER+]: Secondary | ICD-10-CM | POA: Diagnosis not present

## 2020-11-20 DIAGNOSIS — R1084 Generalized abdominal pain: Secondary | ICD-10-CM | POA: Diagnosis present

## 2020-11-20 DIAGNOSIS — D649 Anemia, unspecified: Secondary | ICD-10-CM | POA: Diagnosis not present

## 2020-11-20 DIAGNOSIS — R180 Malignant ascites: Secondary | ICD-10-CM | POA: Diagnosis present

## 2020-11-20 DIAGNOSIS — F17210 Nicotine dependence, cigarettes, uncomplicated: Secondary | ICD-10-CM | POA: Diagnosis present

## 2020-11-20 DIAGNOSIS — C50812 Malignant neoplasm of overlapping sites of left female breast: Secondary | ICD-10-CM | POA: Diagnosis not present

## 2020-11-20 DIAGNOSIS — Z853 Personal history of malignant neoplasm of breast: Secondary | ICD-10-CM | POA: Diagnosis not present

## 2020-11-20 DIAGNOSIS — K219 Gastro-esophageal reflux disease without esophagitis: Secondary | ICD-10-CM

## 2020-11-20 DIAGNOSIS — R188 Other ascites: Secondary | ICD-10-CM | POA: Diagnosis not present

## 2020-11-20 DIAGNOSIS — C786 Secondary malignant neoplasm of retroperitoneum and peritoneum: Secondary | ICD-10-CM | POA: Diagnosis present

## 2020-11-20 DIAGNOSIS — I10 Essential (primary) hypertension: Secondary | ICD-10-CM | POA: Diagnosis present

## 2020-11-20 DIAGNOSIS — R630 Anorexia: Secondary | ICD-10-CM | POA: Diagnosis not present

## 2020-11-20 DIAGNOSIS — R918 Other nonspecific abnormal finding of lung field: Secondary | ICD-10-CM | POA: Diagnosis not present

## 2020-11-20 DIAGNOSIS — J90 Pleural effusion, not elsewhere classified: Secondary | ICD-10-CM

## 2020-11-20 DIAGNOSIS — I700 Atherosclerosis of aorta: Secondary | ICD-10-CM | POA: Diagnosis not present

## 2020-11-20 DIAGNOSIS — C781 Secondary malignant neoplasm of mediastinum: Secondary | ICD-10-CM | POA: Diagnosis present

## 2020-11-20 DIAGNOSIS — F32A Depression, unspecified: Secondary | ICD-10-CM

## 2020-11-20 DIAGNOSIS — I671 Cerebral aneurysm, nonruptured: Secondary | ICD-10-CM | POA: Diagnosis present

## 2020-11-20 DIAGNOSIS — R911 Solitary pulmonary nodule: Secondary | ICD-10-CM | POA: Diagnosis present

## 2020-11-20 DIAGNOSIS — N9489 Other specified conditions associated with female genital organs and menstrual cycle: Secondary | ICD-10-CM | POA: Diagnosis not present

## 2020-11-20 DIAGNOSIS — D638 Anemia in other chronic diseases classified elsewhere: Secondary | ICD-10-CM | POA: Diagnosis present

## 2020-11-20 DIAGNOSIS — E860 Dehydration: Secondary | ICD-10-CM | POA: Diagnosis present

## 2020-11-20 DIAGNOSIS — E538 Deficiency of other specified B group vitamins: Secondary | ICD-10-CM | POA: Diagnosis present

## 2020-11-20 DIAGNOSIS — Z9012 Acquired absence of left breast and nipple: Secondary | ICD-10-CM | POA: Diagnosis not present

## 2020-11-20 DIAGNOSIS — Z881 Allergy status to other antibiotic agents status: Secondary | ICD-10-CM | POA: Diagnosis not present

## 2020-11-20 DIAGNOSIS — R1909 Other intra-abdominal and pelvic swelling, mass and lump: Secondary | ICD-10-CM | POA: Diagnosis present

## 2020-11-20 DIAGNOSIS — Z20822 Contact with and (suspected) exposure to covid-19: Secondary | ICD-10-CM | POA: Diagnosis present

## 2020-11-20 DIAGNOSIS — Z79811 Long term (current) use of aromatase inhibitors: Secondary | ICD-10-CM | POA: Diagnosis not present

## 2020-11-20 DIAGNOSIS — C7949 Secondary malignant neoplasm of other parts of nervous system: Secondary | ICD-10-CM | POA: Diagnosis present

## 2020-11-20 DIAGNOSIS — R590 Localized enlarged lymph nodes: Secondary | ICD-10-CM

## 2020-11-20 DIAGNOSIS — D75839 Thrombocytosis, unspecified: Secondary | ICD-10-CM | POA: Diagnosis present

## 2020-11-20 DIAGNOSIS — N189 Chronic kidney disease, unspecified: Secondary | ICD-10-CM

## 2020-11-20 DIAGNOSIS — C7989 Secondary malignant neoplasm of other specified sites: Secondary | ICD-10-CM | POA: Diagnosis present

## 2020-11-20 DIAGNOSIS — R140 Abdominal distension (gaseous): Secondary | ICD-10-CM | POA: Diagnosis not present

## 2020-11-20 DIAGNOSIS — E785 Hyperlipidemia, unspecified: Secondary | ICD-10-CM | POA: Diagnosis present

## 2020-11-20 LAB — IRON AND TIBC
Iron: 39 ug/dL (ref 28–170)
Saturation Ratios: 19 % (ref 10.4–31.8)
TIBC: 204 ug/dL — ABNORMAL LOW (ref 250–450)
UIBC: 165 ug/dL

## 2020-11-20 LAB — BASIC METABOLIC PANEL
Anion gap: 9 (ref 5–15)
BUN: 20 mg/dL (ref 8–23)
CO2: 24 mmol/L (ref 22–32)
Calcium: 8.7 mg/dL — ABNORMAL LOW (ref 8.9–10.3)
Chloride: 105 mmol/L (ref 98–111)
Creatinine, Ser: 1.37 mg/dL — ABNORMAL HIGH (ref 0.44–1.00)
GFR, Estimated: 44 mL/min — ABNORMAL LOW (ref >60)
Glucose, Bld: 90 mg/dL (ref 70–99)
Potassium: 4.1 mmol/L (ref 3.5–5.1)
Sodium: 138 mmol/L (ref 135–145)

## 2020-11-20 LAB — BODY FLUID CELL COUNT WITH DIFFERENTIAL
Eos, Fluid: 0 %
Lymphs, Fluid: 45 %
Monocyte-Macrophage-Serous Fluid: 32 % — ABNORMAL LOW (ref 50–90)
Neutrophil Count, Fluid: 23 % (ref 0–25)
Total Nucleated Cell Count, Fluid: 747 cu mm (ref 0–1000)

## 2020-11-20 LAB — PROTEIN, PLEURAL OR PERITONEAL FLUID: Total protein, fluid: 3.8 g/dL

## 2020-11-20 LAB — CBC
HCT: 32.3 % — ABNORMAL LOW (ref 36.0–46.0)
Hemoglobin: 10 g/dL — ABNORMAL LOW (ref 12.0–15.0)
MCH: 27.2 pg (ref 26.0–34.0)
MCHC: 31 g/dL (ref 30.0–36.0)
MCV: 88 fL (ref 80.0–100.0)
Platelets: 461 10*3/uL — ABNORMAL HIGH (ref 150–400)
RBC: 3.67 MIL/uL — ABNORMAL LOW (ref 3.87–5.11)
RDW: 16 % — ABNORMAL HIGH (ref 11.5–15.5)
WBC: 8.2 10*3/uL (ref 4.0–10.5)
nRBC: 0 % (ref 0.0–0.2)

## 2020-11-20 LAB — LACTATE DEHYDROGENASE, PLEURAL OR PERITONEAL FLUID: LD, Fluid: 121 U/L — ABNORMAL HIGH (ref 3–23)

## 2020-11-20 LAB — ALBUMIN, PLEURAL OR PERITONEAL FLUID: Albumin, Fluid: 2 g/dL

## 2020-11-20 LAB — ABO/RH: ABO/RH(D): A POS

## 2020-11-20 LAB — FOLATE: Folate: 2.7 ng/mL — ABNORMAL LOW (ref >5.9)

## 2020-11-20 LAB — SARS CORONAVIRUS 2 (TAT 6-24 HRS): SARS Coronavirus 2: NEGATIVE

## 2020-11-20 LAB — GLUCOSE, PLEURAL OR PERITONEAL FLUID: Glucose, Fluid: 87 mg/dL

## 2020-11-20 LAB — VITAMIN B12: Vitamin B-12: 146 pg/mL — ABNORMAL LOW (ref 180–914)

## 2020-11-20 MED ORDER — FAMOTIDINE 20 MG PO TABS
20.0000 mg | ORAL_TABLET | Freq: Every day | ORAL | Status: DC
  Administered 2020-11-20 – 2020-11-21 (×2): 20 mg via ORAL
  Filled 2020-11-20 (×2): qty 1

## 2020-11-20 MED ORDER — ACETAMINOPHEN 500 MG PO TABS
500.0000 mg | ORAL_TABLET | Freq: Four times a day (QID) | ORAL | Status: DC | PRN
  Administered 2020-11-20 (×2): 500 mg via ORAL
  Filled 2020-11-20 (×2): qty 1

## 2020-11-20 MED ORDER — CYANOCOBALAMIN 1000 MCG/ML IJ SOLN
1000.0000 ug | Freq: Every day | INTRAMUSCULAR | Status: DC
Start: 2020-11-20 — End: 2020-11-21
  Administered 2020-11-20 – 2020-11-21 (×2): 1000 ug via SUBCUTANEOUS
  Filled 2020-11-20 (×3): qty 1

## 2020-11-20 MED ORDER — ALUM & MAG HYDROXIDE-SIMETH 200-200-20 MG/5ML PO SUSP
30.0000 mL | Freq: Once | ORAL | Status: AC
  Administered 2020-11-20: 30 mL via ORAL
  Filled 2020-11-20: qty 30

## 2020-11-20 MED ORDER — PANTOPRAZOLE SODIUM 20 MG PO TBEC
20.0000 mg | DELAYED_RELEASE_TABLET | Freq: Every day | ORAL | Status: DC
  Administered 2020-11-20 – 2020-11-21 (×2): 20 mg via ORAL
  Filled 2020-11-20 (×3): qty 1

## 2020-11-20 MED ORDER — HYDROCODONE-ACETAMINOPHEN 5-325 MG PO TABS
1.0000 | ORAL_TABLET | ORAL | Status: DC | PRN
  Administered 2020-11-20 – 2020-11-21 (×2): 2 via ORAL
  Filled 2020-11-20 (×2): qty 2

## 2020-11-20 MED ORDER — ATORVASTATIN CALCIUM 20 MG PO TABS
20.0000 mg | ORAL_TABLET | Freq: Every day | ORAL | Status: DC
  Administered 2020-11-20 – 2020-11-21 (×2): 20 mg via ORAL
  Filled 2020-11-20 (×2): qty 1

## 2020-11-20 MED ORDER — ALBUMIN HUMAN 25 % IV SOLN
75.0000 g | Freq: Once | INTRAVENOUS | Status: AC
  Administered 2020-11-20: 75 g via INTRAVENOUS
  Filled 2020-11-20: qty 300

## 2020-11-20 MED ORDER — ALBUMIN HUMAN 25 % IV SOLN
75.0000 g | Freq: Once | INTRAVENOUS | Status: DC
  Filled 2020-11-20: qty 300

## 2020-11-20 MED ORDER — PAROXETINE HCL 20 MG PO TABS
40.0000 mg | ORAL_TABLET | ORAL | Status: DC
  Administered 2020-11-20 – 2020-11-21 (×2): 40 mg via ORAL
  Filled 2020-11-20 (×2): qty 2

## 2020-11-20 MED ORDER — ANASTROZOLE 1 MG PO TABS
1.0000 mg | ORAL_TABLET | Freq: Every day | ORAL | Status: DC
  Administered 2020-11-20 – 2020-11-21 (×2): 1 mg via ORAL
  Filled 2020-11-20 (×3): qty 1

## 2020-11-20 MED ORDER — VITAMIN D 25 MCG (1000 UNIT) PO TABS
1000.0000 [IU] | ORAL_TABLET | Freq: Every day | ORAL | Status: DC
  Administered 2020-11-20 – 2020-11-21 (×2): 1000 [IU] via ORAL
  Filled 2020-11-20 (×2): qty 1

## 2020-11-20 MED ORDER — FOLIC ACID 1 MG PO TABS
1.0000 mg | ORAL_TABLET | Freq: Every day | ORAL | Status: DC
  Administered 2020-11-20 – 2020-11-21 (×2): 1 mg via ORAL
  Filled 2020-11-20 (×2): qty 1

## 2020-11-20 MED ORDER — LIDOCAINE HCL 1 % IJ SOLN
INTRAMUSCULAR | Status: AC
  Filled 2020-11-20: qty 20

## 2020-11-20 MED ORDER — LIDOCAINE VISCOUS HCL 2 % MT SOLN
15.0000 mL | Freq: Once | OROMUCOSAL | Status: AC
  Administered 2020-11-20: 15 mL via ORAL
  Filled 2020-11-20: qty 15

## 2020-11-20 NOTE — H&P (Addendum)
History and Physical    DANILYN COCKE HCW:[id number] DOB: 1961-05-28 DOA: 11/19/2020  PCP: [name], DO  Patient coming from: Home  I have personally briefly reviewed patient's old medical records in [place]  Chief Complaint: Abdominal distention, dry heaves and diarrhea  HPI: Carolyn Barron is a 60 y.o. female with medical history significant for remote history of breast cancer s/p left mastectomy, hypertension, hyperlipidemia and depression who presents with concerns of increasing abdominal bloating and shortness of breath.  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  This was deemed unrelated to her palsy.  A repeat MRI with contrast was obtained which revealed a mass behind her left eye.  Unfortunately, this MRI is not in our system.  She states then the neurosurgeon obtained CT abdomen and CT chest which shows potential widespread metastasis.  For the past 2 to 3 weeks she has noticed increased abdominal bloating and dry heaving as well as diarrhea.  This has become progressively worse to the point where she can no longer walk without increasing shortness of breath.  She has been unable to eat due to nausea and dry heaving.  Thinks she has lost about 10 pounds or more in the past month.  Patient has remote history of breast cancer and was previously on maintenance tamoxifen but has lost follow-up with oncology for some time due to loss of insurance.  Patient reports continuous use of tobacco 1 pack/day.  She denies any alcohol or illicit drug use.  ED Course: She was afebrile but tachycardic and tachypneic on room air.  No leukocytosis but had mild anemia of 10.7.  Thrombocytosis of 524.  Mild AKI of 1.3.  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  There is also a small to moderate left pleural effusion.  Review of Systems:  Constitutional: + Weight Change, No Fever ENT/Mouth: No sore throat, No Rhinorrhea Eyes: No Eye Pain, No Vision Changes Cardiovascular: No Chest Pain, + SOB, No PND, + Dyspnea on Exertion, No Edema, No Palpitations Respiratory: No Cough, No Sputum Gastrointestinal: + Nausea, + Vomiting, + Diarrhea, No Constipation, + Pain Genitourinary: no Urinary Incontinence, No Urgency, No Flank Pain Musculoskeletal: No Arthralgias, No Myalgias Skin: No Skin Lesions, No Pruritus, Neuro: + Weakness, No Numbness Psych: No Anxiety/Panic, No Depression, + decrease appetite Heme/Lymph: No Bruising, No Bleeding  Past Medical History:  Diagnosis Date  . Breast cancer ([place])   . Cancer ([place])   . Hypertension   . Vertigo     Past Surgical History:  Procedure Laterality Date  . CARPAL TUNNEL RELEASE    . MASTECTOMY       reports that she has been smoking cigarettes. She has been smoking about 0.50 packs per day. She has never used smokeless tobacco. She reports that she does not drink alcohol and does not use drugs. Social History  Allergies  Allergen Reactions  . Clindamycin/Lincomycin Rash    Father- lung and brain cancer Paternal GM- lung and brain cancer Aunt- breast cancer   Prior to Admission medications   Medication Sig Start Date End Date Taking? Authorizing Provider  acetaminophen (TYLENOL) 500 MG tablet Take 500 mg by mouth every 6 (six) hours as needed for moderate pain.   Yes [provider]  albuterol (VENTOLIN HFA) 108 (90 Base) MCG/ACT inhaler Inhale 2 puffs into the lungs every 6 (six) hours as needed for wheezing or shortness of breath.   Yes [provider]  atorvastatin (LIPITOR) 20 MG tablet Take 20 mg by mouth daily. 11/16/20  Yes [provider]  cholecalciferol (VITAMIN D3) 25 MCG (1000 UNIT) tablet Take 1,000 Units by mouth daily.   Yes [provider]  famotidine (PEPCID) 20 MG tablet Take 20 mg by mouth daily. 11/17/20  Yes [provider]  Loratadine 10 MG CAPS Take 1 capsule by mouth daily as needed.   Yes [provider]  pantoprazole (PROTONIX) 20 MG tablet Take 20 mg by mouth daily.   Yes [provider]  PARoxetine (PAXIL) 40 MG tablet Take 40 mg by mouth every morning.   Yes [provider]  tamoxifen (NOLVADEX) 20 MG tablet TAKE ONE TABLET BY MOUTH ONCE DAILY Patient not taking: No sig reported 11/18/14   Chauncey Cruel, MD    Physical Exam: Vitals:   11/19/20 2300 11/19/20 2330 11/20/20 0000 11/20/20 0026  BP: 132/75 (!) 144/91 (!) 144/90 129/79  Pulse: (!) 105 (!) 105 (!) 103 (!) 103  Resp: (!) 28  17 16   Temp:   98.7 F (37.1 C) 97.8 F (36.6 C)  TempSrc:   Oral Oral  SpO2: 97% 95% 95% 96%    Constitutional: NAD, calm, comfortable, elderly female laying in bed Vitals:   11/19/20 2300 11/19/20 2330 11/20/20 0000 11/20/20 0026  BP: 132/75 (!) 144/91 (!) 144/90 129/79  Pulse: (!) 105 (!) 105 (!) 103 (!) 103  Resp: (!) 28  17 16   Temp:   98.7 F (37.1 C) 97.8 F (36.6 C)  TempSrc:   Oral Oral  SpO2: 97% 95% 95% 96%   Eyes: PERRL, difficulty opening left eye ENMT: Mucous membranes are moist.  Neck: normal, supple Respiratory: clear to auscultation bilaterally, no wheezing, no crackles. Normal respiratory effort. No accessory muscle use.  Cardiovascular: Regular rate and rhythm, no murmurs / rubs / gallops. No extremity edema.   Abdomen: Significantly tight and distended abdomen with mild diffuse tenderness.  Difficulty auscultating bowel sounds due to significant distention. Musculoskeletal: no clubbing / cyanosis. No joint deformity upper and lower extremities. Good ROM, no contractures. Normal muscle tone.  Skin: no rashes, lesions, ulcers. No induration Neurologic: CN 2-12 grossly intact. Sensation intact,  Strength 5/5 in all 4.  Psychiatric: Normal judgment and insight.  Alert and oriented x 3. Normal mood.     Labs on Admission: I have personally reviewed following labs and imaging studies  CBC: Recent Labs  Lab 11/19/20 1950  WBC 8.7  NEUTROABS 6.5  HGB 10.7*  HCT 34.9*  MCV 87.5  PLT 712*   Basic Metabolic Panel: Recent Labs  Lab 11/19/20 1950  NA 137  K 4.2  CL 102  CO2 24  GLUCOSE 103*  BUN 20  CREATININE 1.30*  CALCIUM 8.8*   GFR: CrCl cannot be calculated (Unknown ideal weight.). Liver Function Tests: Recent Labs  Lab 11/19/20 1950  AST 41  ALT 10  ALKPHOS 183*  BILITOT 0.3  PROT 6.6  ALBUMIN 2.7*   Recent Labs  Lab 11/19/20 1950  LIPASE 44   No results for input(s): AMMONIA in the last 168 hours. Coagulation Profile: No results for input(s): INR, PROTIME in the last 168 hours. Cardiac Enzymes: No results for input(s): CKTOTAL, CKMB, CKMBINDEX, TROPONINI in the last 168 hours. BNP (  last 3 results) No results for input(s): PROBNP in the last 8760 hours. HbA1C: No results for input(s): HGBA1C in the last 72 hours. CBG: No results for input(s): GLUCAP in the last 168 hours. Lipid Profile: No results for input(s): CHOL, HDL, LDLCALC, TRIG, CHOLHDL, LDLDIRECT in the last 72 hours. Thyroid Function Tests: No results for input(s): TSH, T4TOTAL, FREET4, T3FREE, THYROIDAB in the last 72 hours. Anemia Panel: No results for input(s): VITAMINB12, FOLATE, FERRITIN, TIBC, IRON, RETICCTPCT in the last 72 hours. Urine analysis:    Component Value Date/Time   COLORURINE YELLOW 04/16/2012 1637   APPEARANCEUR CLOUDY (A) 04/16/2012 1637   LABSPEC 1.022 04/16/2012 1637   PHURINE 6.0 04/16/2012 1637   GLUCOSEU NEGATIVE 04/16/2012 1637   HGBUR NEGATIVE 04/16/2012 1637   BILIRUBINUR NEGATIVE 04/16/2012 1637   KETONESUR NEGATIVE 04/16/2012 1637   PROTEINUR NEGATIVE 04/16/2012 1637   UROBILINOGEN 0.2 04/16/2012 1637   NITRITE NEGATIVE 04/16/2012 1637   LEUKOCYTESUR NEGATIVE 04/16/2012 1637    Radiological Exams on  Admission: No results found.    Assessment/Plan  Bulky mediastinal lymphadenopathy Tiny bilateral pulmonary nodule T-spine lucency  Right adnexal lesion Cecum wall thickening Large volume abdominal ascites - Likely widespread metastasis noted on CT chest and abdomen with possible ovarian primary - need oncology consult  - Also possible GI consult for potential colorectal cancer with cecum wall thickening noted - obtain paracentesis with fluid study   Left small/moderate pleural effusion - thoracentesis ordered with fluid study   AKI - She was given 500cc in the ED. Likely intravascular depleted but is third spacing with large volume ascites noted. Hold on any further fluids.  - monitor with repeat in the morning   Anemia  - Hgb of 10.7 - obtain FOBT, iron study, vitamin L54 and folic  Depression -continue Paxil  GERD -continue home meds  DVT prophylaxis:.SCDs Code Status: Full Family Communication: Plan discussed with patient at bedside  disposition Plan: Home with observation Consults called:  Admission status: Observation   Level of care: Telemetry  Status is: Observation  The patient remains OBS appropriate and will d/c before 2 midnights.  Dispo: The patient is from: Home              Anticipated d/c is to: Home              Patient currently is not medically stable to d/c.   Difficult to place patient No         Orene Desanctis DO [facility]   If 7PM-7AM, please contact night-coverage [email or web address]   11/20/2020, 1:30 AM

## 2020-11-20 NOTE — Plan of Care (Signed)

## 2020-11-20 NOTE — Consult Note (Addendum)
Carolyn Barron  [phone] [phone]   [place]  Referral MD: Dr. Holli Humbles  Reason for Referral: Bulky mediastinal lymphadenopathy, mild right hilar lymphadenopathy, nodularity in the omentum, lymphadenopathy in the hepatoduodenal ligament and ileocolic mesentery, probable peritoneal nodule in the right cul-de-sac, soft tissue lesion in the right adnexal space, wall thickening in the cecum  HPI: Carolyn Barron is a 60 year old female with a past medical history significant for T1cN0, stage Ia invasive lobular breast cancer, grade 2, strongly estrogen and progesterone receptor positive, HER2 negative status post left mastectomy followed by tamoxifen (lost to follow-up secondary to insurance reasons), hypertension, hyperlipidemia, depression.  The patient presented to the hospital with increased abdominal bloating and shortness of breath.  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  A repeat MRI with contrast was obtained which revealed a mass behind her left eye.  This MRI is not in our system.  As part of her work-up, the neurosurgeon obtained a CT of the abdomen and chest which showed potential widespread metastatic disease.  See CT scan findings below for details.  Labs on admission significant for hemoglobin of 10.7, platelets 524,000, creatinine 1.3, calcium 8.8, albumin 2.7, alk phos 183.  The patient underwent an ultrasound-guided paracentesis earlier today with 9 L of fluid removed.  Fluid has been sent for cytology.  For the past 2 to 3 weeks, the patient has had increased abdominal bloating and dry heaving as well as diarrhea.  This has become progressively worse to the point that she can no longer walk without increasing shortness of breath and she has not been  able to eat due to nausea and dry heaving.  She has lost about 10 pounds in the past month.  The patient just returned from IR for the time my visit.  She reports that her abdominal bloating has improved.  However, she still has abdominal pain.  She reports that she saw ophthalmology initially due to blurred and double vision.  She is not having any headaches but does report some dizziness.  No recent fevers or chills.  She denies chest pain and has had shortness of breath due to significant abdominal fluid.  Shortness of breath improved following paracentesis.  No chest pain reported.  She is not currently having any nausea or vomiting.  Denies constipation diarrhea.  Denies bleeding.  Denies lower extremity edema.  The patient reports that she had a colonoscopy many years ago.  She thinks this was performed sometime after 2010.  She tells me that she had 9 polyps and was advised to return to 5 years but did not due to insurance reasons.  The patient is widowed.  She currently lives with her mother who was in her 55s.  She has 1 child who lives in [place].  Denies history of alcohol use.  She currently smokes a pack of cigarettes per day.  Medical oncology was asked see the patient to make recommendations regarding her abnormal CT scan findings.   Past Medical History:  Diagnosis Date  . Breast cancer ([place])   . Cancer ([place])   . Hypertension   . Vertigo   :  Past Surgical History:  Procedure Laterality Date  . CARPAL TUNNEL RELEASE    . MASTECTOMY    :  Current Facility-Administered Medications  Medication  Dose Route Frequency Provider Last Rate Last Admin  . acetaminophen (TYLENOL) tablet 500 mg  500 mg Oral Q6H PRN Tu, Ching T, DO   500 mg at 11/20/20 1054  . atorvastatin (LIPITOR) tablet 20 mg  20 mg Oral Daily Tu, Ching T, DO   20 mg at 11/20/20 1050  . cholecalciferol (VITAMIN D3) tablet 1,000 Units  1,000 Units Oral Daily Tu, Ching T, DO   1,000 Units at 11/20/20 1050  . famotidine  (PEPCID) tablet 20 mg  20 mg Oral Daily Tu, Ching T, DO   20 mg at 11/20/20 1049  . lidocaine (XYLOCAINE) 1 % (with pres) injection           . pantoprazole (PROTONIX) EC tablet 20 mg  20 mg Oral Daily Tu, Ching T, DO   20 mg at 11/20/20 1049  . PARoxetine (PAXIL) tablet 40 mg  40 mg Oral BH-[id number] Tu, Ching T, DO   40 mg at 11/20/20 1610     Allergies  Allergen Reactions  . Clindamycin/Lincomycin Rash  :  History reviewed. No pertinent family history.:  Social History   Socioeconomic History  . Marital status: Married    Spouse name: Not on file  . Number of children: Not on file  . Years of education: Not on file  . Highest education level: Not on file  Occupational History  . Not on file  Tobacco Use  . Smoking status: Current Every Day Smoker    Packs/day: 0.50    Types: Cigarettes  . Smokeless tobacco: Never Used  Substance and Sexual Activity  . Alcohol use: No  . Drug use: No  . Sexual activity: Not on file  Other Topics Concern  . Not on file  Social History Narrative  . Not on file   Social Determinants of Health   Financial Resource Strain: Not on file  Food Insecurity: Not on file  Transportation Needs: Not on file  Physical Activity: Not on file  Stress: Not on file  Social Connections: Not on file  Intimate Partner Violence: Not on file  :  Review of Systems: A comprehensive 14 point review of systems was negative except as noted in the HPI.  Exam: Patient Vitals for the past 24 hrs:  BP Temp Temp src Pulse Resp SpO2  11/20/20 1018 121/67 -- -- -- -- --  11/20/20 1011 123/63 -- -- -- -- --  11/20/20 1007 109/66 -- -- -- -- --  11/20/20 1002 118/71 -- -- -- -- --  11/20/20 0952 131/70 -- -- -- -- --  11/20/20 0951 128/79 -- -- -- -- --  11/20/20 0931 131/74 -- -- -- -- --  11/20/20 0825 133/88 98.3 F (36.8 C) Oral 100 20 95 %  11/20/20 0517 135/80 98.1 F (36.7 C) Oral 96 18 95 %  11/20/20 0026 129/79 97.8 F (36.6 C) Oral (!) 103 16 96 %   11/20/20 0000 (!) 144/90 98.7 F (37.1 C) Oral (!) 103 17 95 %  11/19/20 2330 (!) 144/91 -- -- (!) 105 -- 95 %  11/19/20 2300 132/75 -- -- (!) 105 (!) 28 97 %  11/19/20 2246 132/78 -- -- (!) 104 -- 96 %  11/19/20 2230 132/78 -- -- -- (!) 25 --  11/19/20 2215 132/78 -- -- (!) 102 (!) 23 94 %  11/19/20 2200 120/68 -- -- (!) 104 (!) 25 94 %  11/19/20 2145 -- -- -- (!) 105 (!) 25 94 %  11/19/20 2130 113/68 -- -- Marland Kitchen  107 (!) 24 95 %  11/19/20 2115 -- -- -- (!) 110 (!) 30 94 %  11/19/20 2100 130/76 -- -- (!) 104 19 93 %  11/19/20 2045 -- -- -- (!) 108 20 97 %  11/19/20 2030 123/69 -- -- (!) 112 (!) 22 97 %  11/19/20 2015 -- -- -- (!) 110 (!) 24 96 %  11/19/20 2000 136/76 -- -- (!) 111 (!) 30 97 %  11/19/20 1945 -- -- -- (!) 107 (!) 29 97 %  11/19/20 1932 138/82 -- -- (!) 108 -- --  11/19/20 1930 138/82 -- -- (!) 108 16 97 %  11/19/20 1811 (!) 125/96 98.6 F (37 C) Oral (!) 119 17 98 %    General: Chronically ill-appearing female, no distress. Eyes:  no scleral icterus.   ENT:  There were no oropharyngeal lesions.    Lymphatics: Palpable right cervical lymph node Respiratory: lungs were clear bilaterally without wheezing or crackles.   Cardiovascular:  Regular rate and rhythm, S1/S2, without murmur, rub or gallop.  There was no pedal edema.   GI:  abdomen was soft, flat, nontender, nondistended, without organomegaly.   Musculoskeletal: Strength symmetrical in the upper and lower extremities. Skin exam was without echymosis, petichae.   Neuro exam was nonfocal. Patient was alert and oriented.  Attention was good. Language was appropriate.  Mood was normal without depression.  Speech was not pressured.  Thought content was not tangential.   BREAST EXAM: s/p left mastectomy with no evidence of chest wall recurrence. Right breast unremarkable  Lab Results  Component Value Date   WBC 8.2 11/20/2020   HGB 10.0 (L) 11/20/2020   HCT 32.3 (L) 11/20/2020   PLT 461 (H) 11/20/2020   GLUCOSE  90 11/20/2020   CHOL (H) 12/03/2009    220        ATP III CLASSIFICATION:  <200     mg/dL   Desirable  200-239  mg/dL   Borderline High  >=240    mg/dL   High          TRIG 163 (H) 12/03/2009   HDL 28 (L) 12/03/2009   LDLCALC (H) 12/03/2009    159        Total Cholesterol/HDL:CHD Risk Coronary Heart Disease Risk Table                     Men   Women  1/2 Average Risk   3.4   3.3  Average Risk       5.0   4.4  2 X Average Risk   9.6   7.1  3 X Average Risk  23.4   11.0        Use the calculated Patient Ratio above and the CHD Risk Table to determine the patient's CHD Risk.        ATP III CLASSIFICATION (LDL):  <100     mg/dL   Optimal  100-129  mg/dL   Near or Above                    Optimal  130-159  mg/dL   Borderline  160-189  mg/dL   High  >190     mg/dL   Very High   ALT 10 11/19/2020   AST 41 11/19/2020   NA 138 11/20/2020   K 4.1 11/20/2020   CL 105 11/20/2020   CREATININE 1.37 (H) 11/20/2020   BUN 20 11/20/2020   CO2 24 11/20/2020  CT CHEST ABDOMEN PELVIS W CONTRAST  Result Date: 11/15/2020 CLINICAL DATA:  Left orbital tumor.  Bloating with loss of appetite. EXAM: CT CHEST, ABDOMEN, AND PELVIS WITH CONTRAST TECHNIQUE: Multidetector CT imaging of the chest, abdomen and pelvis was performed following the standard protocol during bolus administration of intravenous contrast. CONTRAST:  70m ISOVUE-370 IOPAMIDOL (ISOVUE-370) INJECTION 76% COMPARISON:  CTA Chest 04/16/2012 FINDINGS: [place] [address] Results: Creatinine 1.5 mg/dL ----------------------------------- CT CHEST FINDINGS Cardiovascular: The heart size is normal. Trace pericardial effusion. Coronary artery calcification is evident. Atherosclerotic calcification is noted in the wall of the thoracic aorta. Mediastinum/Nodes: Bulky mediastinal lymphadenopathy evident. 2 cm short axis right paratracheal node evident. 2.3 cm short axis lymph node  identified in the prevascular space/AP window. There is a 2.7 cm short axis node in the precarinal station an 2.6 cm lymph node evident in the subcarinal location. Mild lymphadenopathy noted right hilum no bulky left hilar adenopathy. The esophagus has normal imaging features. There is no axillary lymphadenopathy. Surgical clips are seen in the left axilla. Lungs/Pleura: 4 mm pulmonary nodule identified right lower lobe on 70/5. 5 mm perifissural nodule seen in the left lower lobe on 62/5. Small to moderate left pleural effusion evident. Musculoskeletal: Multiple lucent thoracic spine lesions demonstrate peripheral sclerosis, highly suspicious for metastatic disease. 11 mm lucent lesion noted in the manubrium on 36/5. Index 17 mm lesion noted in the T5 vertebral body (45/5). CT ABDOMEN PELVIS FINDINGS Hepatobiliary: No suspicious focal abnormality within the liver parenchyma. There is no evidence for gallstones, gallbladder wall thickening, or pericholecystic fluid. No intrahepatic or extrahepatic biliary dilation. Pancreas: No focal mass lesion. No dilatation of the main duct. No intraparenchymal cyst. No peripancreatic edema. Spleen: No splenomegaly. No focal mass lesion. Adrenals/Urinary Tract: No adrenal nodule or mass. Kidneys unremarkable No evidence for hydroureter. The urinary bladder appears normal for the degree of distention. Stomach/Bowel: Stomach is nondistended which may contribute to the appearance of diffuse wall thickening. Duodenum is normally positioned as is the ligament of Treitz. No small bowel wall thickening. No small bowel dilatation. The terminal ileum is normal. The appendix is normal. Focal apparent wall thickening is noted in the cecum (image 83/2 and coronal 99/6). Remaining segments of the colon are decompressed. Vascular/Lymphatic: There is abdominal aortic atherosclerosis without aneurysm. 17 mm short axis portal caval lymph node identified on image 59/2. Other similar hepatoduodenal  ligament lymphadenopathy evident. Lymphadenopathy noted in the ileocolic ligament (images 80 and 77 of series 2). Upper normal para-aortic retroperitoneal nodes identified. No pelvic sidewall lymphadenopathy Reproductive: The uterus is unremarkable. 4.4 x 3.5 x 5.4 cm soft tissue lesion is identified in the right adnexal space. Left adnexal space unremarkable. Other: Markedly large volume ascites. Bandlike areas of soft tissue nodularity are seen in the omentum (axial 57/2 and 69/2). Probable focus of peritoneal nodularity in the right cul-de-sac on 102/2. Musculoskeletal: Markedly heterogeneous mineralization of the bony anatomy suggest metastatic involvement. 18 mm lucent lesion identified in the right iliac bone adjacent to the SI joint. IMPRESSION: 1. Bulky mediastinal lymphadenopathy consistent with metastatic disease. There is mild right hilar lymphadenopathy. 2. Tiny bilateral pulmonary nodules, indeterminate. Attention on follow-up recommended. 3. Markedly large volume ascites with bandlike areas of linear nodularity in the omentum bilaterally, concerning for metastatic involvement. There is lymphadenopathy in the hepatoduodenal ligament and ileocolic mesentery. Probable peritoneal nodule in the right cul-de-sac. 4. 5 cm soft tissue lesion in the right adnexal space may  be related to ovarian primary or metastatic involvement. 5. Suggestion of diffuse wall thickening in the stomach with associated underdistention making the finding somewhat indeterminate. Upper endoscopy may prove helpful to further evaluate. 6. There is also suggestion of wall thickening in the cecum although colon is not well distended. Correlation with colorectal cancer screening history recommended. Electronically Signed   By: Misty Stanley M.D.   On: 11/15/2020 15:47     CT CHEST ABDOMEN PELVIS W CONTRAST  Result Date: 11/15/2020 CLINICAL DATA:  Left orbital tumor.  Bloating with loss of appetite. EXAM: CT CHEST, ABDOMEN, AND  PELVIS WITH CONTRAST TECHNIQUE: Multidetector CT imaging of the chest, abdomen and pelvis was performed following the standard protocol during bolus administration of intravenous contrast. CONTRAST:  2m ISOVUE-370 IOPAMIDOL (ISOVUE-370) INJECTION 76% COMPARISON:  CTA Chest 04/16/2012 FINDINGS: [place] [address] Results: Creatinine 1.5 mg/dL ----------------------------------- CT CHEST FINDINGS Cardiovascular: The heart size is normal. Trace pericardial effusion. Coronary artery calcification is evident. Atherosclerotic calcification is noted in the wall of the thoracic aorta. Mediastinum/Nodes: Bulky mediastinal lymphadenopathy evident. 2 cm short axis right paratracheal node evident. 2.3 cm short axis lymph node identified in the prevascular space/AP window. There is a 2.7 cm short axis node in the precarinal station an 2.6 cm lymph node evident in the subcarinal location. Mild lymphadenopathy noted right hilum no bulky left hilar adenopathy. The esophagus has normal imaging features. There is no axillary lymphadenopathy. Surgical clips are seen in the left axilla. Lungs/Pleura: 4 mm pulmonary nodule identified right lower lobe on 70/5. 5 mm perifissural nodule seen in the left lower lobe on 62/5. Small to moderate left pleural effusion evident. Musculoskeletal: Multiple lucent thoracic spine lesions demonstrate peripheral sclerosis, highly suspicious for metastatic disease. 11 mm lucent lesion noted in the manubrium on 36/5. Index 17 mm lesion noted in the T5 vertebral body (45/5). CT ABDOMEN PELVIS FINDINGS Hepatobiliary: No suspicious focal abnormality within the liver parenchyma. There is no evidence for gallstones, gallbladder wall thickening, or pericholecystic fluid. No intrahepatic or extrahepatic biliary dilation. Pancreas: No focal mass lesion. No dilatation of the main duct. No intraparenchymal cyst. No peripancreatic edema. Spleen: No  splenomegaly. No focal mass lesion. Adrenals/Urinary Tract: No adrenal nodule or mass. Kidneys unremarkable No evidence for hydroureter. The urinary bladder appears normal for the degree of distention. Stomach/Bowel: Stomach is nondistended which may contribute to the appearance of diffuse wall thickening. Duodenum is normally positioned as is the ligament of Treitz. No small bowel wall thickening. No small bowel dilatation. The terminal ileum is normal. The appendix is normal. Focal apparent wall thickening is noted in the cecum (image 83/2 and coronal 99/6). Remaining segments of the colon are decompressed. Vascular/Lymphatic: There is abdominal aortic atherosclerosis without aneurysm. 17 mm short axis portal caval lymph node identified on image 59/2. Other similar hepatoduodenal ligament lymphadenopathy evident. Lymphadenopathy noted in the ileocolic ligament (images 80 and 77 of series 2). Upper normal para-aortic retroperitoneal nodes identified. No pelvic sidewall lymphadenopathy Reproductive: The uterus is unremarkable. 4.4 x 3.5 x 5.4 cm soft tissue lesion is identified in the right adnexal space. Left adnexal space unremarkable. Other: Markedly large volume ascites. Bandlike areas of soft tissue nodularity are seen in the omentum (axial 57/2 and 69/2). Probable focus of peritoneal nodularity in the right cul-de-sac on 102/2. Musculoskeletal: Markedly heterogeneous mineralization of the bony anatomy suggest metastatic involvement. 18 mm lucent lesion identified in the right iliac bone adjacent to the  SI joint. IMPRESSION: 1. Bulky mediastinal lymphadenopathy consistent with metastatic disease. There is mild right hilar lymphadenopathy. 2. Tiny bilateral pulmonary nodules, indeterminate. Attention on follow-up recommended. 3. Markedly large volume ascites with bandlike areas of linear nodularity in the omentum bilaterally, concerning for metastatic involvement. There is lymphadenopathy in the hepatoduodenal  ligament and ileocolic mesentery. Probable peritoneal nodule in the right cul-de-sac. 4. 5 cm soft tissue lesion in the right adnexal space may be related to ovarian primary or metastatic involvement. 5. Suggestion of diffuse wall thickening in the stomach with associated underdistention making the finding somewhat indeterminate. Upper endoscopy may prove helpful to further evaluate. 6. There is also suggestion of wall thickening in the cecum although colon is not well distended. Correlation with colorectal cancer screening history recommended. Electronically Signed   By: Misty Stanley M.D.   On: 11/15/2020 15:47   Assessment and Plan:  This is a 60 year old female with a history of stage Ia invasive lobular breast cancer now with concerns for widespread malignancy noted on CT scan.  Scan findings are suspicious for GYN malignancy versus GI malignancy.  1.  Abnormal CT scan findings concerning for widespread malignancy 2.  Ascites 3.  AKI 4.  Normocytic anemia with vitamin B12 and folate deficiency 5.  Thrombocytosis, likely reactive 6.  Depression 7.  GERD  -Discussed CT scan findings with the patient.  We discussed findings are concerning for metastatic malignancy.  We discussed differentials including GYN versus GI versus other. -Await cytology from paracentesis performed earlier today. -The patient may also benefit from a GI consult due to thickening of the cecum noted on CT scan. -Further recommendations for treatment pending confirmation of diagnosis. -IV fluids for AKI hospitalist. -Begin folic acid 1 mg daily and vitamin B12 1000 mcg subcu daily x3 days. -Monitor CBC closely.  Thank you for this referral.   Mikey Bussing, DNP, AGPCNP-BC, AOCNP   ADDENDUM: 60 y/o [place] woman s/pstatus post left mastectomy and sentinel lymph node sampling in November 2010 for a T1c N0, stage IA invasive lobular carcinoma, grade 2, strongly estrogen and progesterone receptor positive, HER-2/neu  negative, with a low proliferation fraction.  She took antiestrogens briefly, was then lost to follow-up, last seen by [place] March 2016; now presenting with evidence of widespread metastatic disease as described above.  The pattern of disease spread now is consistent with metastatic lobular breast cancer, which spares the lung and liver but infiltrates the peritoneum, pulmonary surfaces, periorbital areas, etc. 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  The patient understand this disease is not curable at present but it is treatable and the treatment is well tolerated, inexpensive and effective. I am starting her on anastrozole pending pathologic confirmation. If pathology confirms the diagnosis, will add abemaciclib as outpatient. I have written for tumor markers to serve as baseline and will request chaplain visit to help the patient complete a HCPOA.  She will benefit from a brief course of XRT to the left orbit.  I will see the patient again 5/9 but if she is discharged before then I will make sure she has follow-up with me next week  I personally saw this patient and performed a substantive portion of this encounter with the listed APP documented above.   Chauncey Cruel, MD Medical Oncology and Hematology [facility] [address] [place], [place] [address] Tel. [phone]    Fax.  [phone]

## 2020-11-20 NOTE — Progress Notes (Signed)
PROGRESS NOTE    Carolyn Barron  ZOX:[id number] DOB: February 21, 1961 DOA: 11/19/2020 PCP: [name], DO   Brief Narrative:  Carolyn Barron is a 60 y.o. female with medical history significant for remote history of breast cancer s/p left mastectomy, hypertension, hyperlipidemia and depression who presents with concerns of increasing abdominal bloating and shortness of breath. 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  This was deemed unrelated to her palsy.  A repeat MRI with contrast was obtained which revealed a mass behind her left eye.  Unfortunately, this MRI is not in our system.  She states then the neurosurgeon obtained CT abdomen and CT chest which shows potential widespread metastasis. For the past 2 to 3 weeks she has noticed increased abdominal bloating and dry heaving as well as diarrhea.  This has become progressively worse to the point where she can no longer walk without increasing shortness of breath.  She has been unable to eat due to nausea and dry heaving.  Thinks she has lost about 10 pounds or more in the past month.  Patient has remote history of breast cancer and was previously on maintenance tamoxifen but has lost follow-up with oncology for some time due to loss of insurance.  Patient reports continuous use of tobacco 1 pack/day.  She denies any alcohol or illicit drug use. 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  There is also a small to moderate left pleural effusion.  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  - Oncology/GI following  - 9L paracentesis today - albumin administered per protocol  Left small/moderate pleural effusion - thoracentesis ordered with fluid study - pending; likely to be performed 11/21/20  AKI without history of CKD - Encouraged PO intake - Hold IVF given ascites  Anemia, likely of chronic disease given above - Hgb stable - Iron WNL - No signs/symptoms of bleeding  Depression -continue Paxil  GERD -continue home meds  DVT prophylaxis: SCDs Code Status: Full Family Communication: None present  Status is: Inpatient  Dispo: The patient is from: Home              Anticipated d/c is to: Home              Anticipated d/c date is: 24 to 48 hours              Patient currently not medically stable for discharge  Consultants:   GI, oncology  Procedures:   Paracentesis 11/20/2020 -9 L withdrawn  Antimicrobials:  None  Subjective: No acute issues or events overnight tolerated procedure quite well denies nausea vomiting diarrhea constipation headache fevers or chills.  Objective: Vitals:   11/19/20 2330 11/20/20 0000 11/20/20 0026 11/20/20 0517  BP: (!) 144/91 (!) 144/90 129/79 135/80  Pulse: (!) 105 (!) 103 (!) 103 96  Resp:  17 16 18   Temp:  98.7 F (37.1 C) 97.8 F (36.6 C) 98.1 F (36.7 C)  TempSrc:  Oral Oral Oral  SpO2: 95% 95% 96% 95%   No intake or output data in the 24 hours ending 11/20/20 0728 There were no vitals filed for this visit.  Examination:  General exam: Appears calm and comfortable  Respiratory system: Clear to auscultation. Respiratory effort normal. Cardiovascular system: S1 & S2 heard, RRR. No JVD, murmurs, rubs, gallops or clicks. No pedal edema. Gastrointestinal system: Abdomen is nondistended, soft and nontender. No organomegaly  or masses felt. Normal bowel sounds heard. Central nervous system: Alert and oriented. No focal neurological deficits. Extremities: Symmetric 5 x 5 power. Skin: No rashes, lesions or ulcers Psychiatry: Judgement and insight appear normal. Mood & affect appropriate.     Data Reviewed: I have personally reviewed following labs and imaging studies  CBC: Recent Labs  Lab 11/19/20 1950 11/20/20 0353  WBC 8.7 8.2  NEUTROABS 6.5  --   HGB 10.7* 10.0*  HCT 34.9* 32.3*  MCV 87.5 88.0  PLT 524* 191*   Basic Metabolic Panel: Recent Labs  Lab 11/19/20 1950 11/20/20 0353  NA 137 138  K 4.2 4.1  CL 102 105  CO2 24 24  GLUCOSE 103* 90  BUN 20 20  CREATININE 1.30* 1.37*  CALCIUM 8.8* 8.7*   GFR: CrCl cannot be calculated (Unknown ideal weight.). Liver Function Tests: Recent Labs  Lab 11/19/20 1950  AST 41  ALT 10  ALKPHOS 183*  BILITOT 0.3  PROT 6.6  ALBUMIN 2.7*   Recent Labs  Lab 11/19/20 1950  LIPASE 44   No results for input(s): AMMONIA in the last 168 hours. Coagulation Profile: No results for input(s): INR, PROTIME in the last 168 hours. Cardiac Enzymes: No results for input(s): CKTOTAL, CKMB, CKMBINDEX, TROPONINI in the last 168 hours. BNP (last 3 results) No results for input(s): PROBNP in the last 8760 hours. HbA1C: No results for input(s): HGBA1C in the last 72 hours. CBG: No results for input(s): GLUCAP in the last 168 hours. Lipid Profile: No results for input(s): CHOL, HDL, LDLCALC, TRIG, CHOLHDL, LDLDIRECT in the last 72 hours. Thyroid Function Tests: No results for input(s): TSH, T4TOTAL, FREET4, T3FREE, THYROIDAB in the last 72 hours. Anemia Panel: Recent Labs    11/20/20 0353  VITAMINB12 146*  FOLATE 2.7*  TIBC 204*  IRON 39   Sepsis Labs: No results for input(s): PROCALCITON, LATICACIDVEN in the last 168 hours.  Recent Results (from the past 240 hour(s))  SARS CORONAVIRUS 2 (TAT 6-24 HRS) Nasopharyngeal Nasopharyngeal Swab      Status: None   Collection Time: 11/19/20 10:00 PM   Specimen: Nasopharyngeal Swab  Result Value Ref Range Status   SARS Coronavirus 2 NEGATIVE NEGATIVE Final    Comment: (NOTE) SARS-CoV-2 target nucleic acids are NOT DETECTED.  The SARS-CoV-2 RNA is generally detectable in upper and lower respiratory specimens during the acute phase of infection. Negative results do not preclude SARS-CoV-2 infection, do not rule out co-infections with other pathogens, and should not be used as the sole basis for treatment or other patient management decisions. Negative results must be combined with clinical observations, patient history, and epidemiological information. The expected result is Negative.  Fact Sheet for Patients: SugarRoll.be  Fact Sheet for Healthcare Providers: https://www.woods-mathews.com/  This test is not yet approved or cleared by the [place] FDA and  has been authorized for detection and/or diagnosis of SARS-CoV-2 by FDA under an Emergency Use Authorization (EUA). This EUA  will remain  in effect (meaning this test can be used) for the duration of the COVID-19 declaration under Se ction 564(b)(1) of the Act, 21 U.S.C. section 360bbb-3(b)(1), unless the authorization is terminated or revoked sooner.  Performed at [facility], [place] [address], [place], [place] [address]          Radiology Studies: No results found.      Scheduled Meds: . atorvastatin  20 mg Oral Daily  . cholecalciferol  1,000 Units Oral Daily  . famotidine  20 mg Oral Daily  . pantoprazole  20 mg Oral Daily  . PARoxetine  40 mg Oral [id number]   Continuous Infusions:   LOS: 0 days    Time spent: 25min    Koreena Joost C Hadyn Blanck, DO [facility]  If 7PM-7AM, please contact night-coverage [email or web address]  11/20/2020, 7:28 AM

## 2020-11-20 NOTE — Procedures (Signed)
PROCEDURE SUMMARY:  Successful image-guided paracentesis from the left lower abdomen.  Yielded 9 liters of clear pale yellow fluid.  No immediate complications.  EBL = none. Patient tolerated well.   Specimen was sent for labs.  Please see imaging section of Epic for full dictation.   Armando Gang Desirie Minteer PA-C 11/20/2020 9:49 AM

## 2020-11-21 ENCOUNTER — Ambulatory Visit
Admit: 2020-11-21 | Discharge: 2020-11-21 | Disposition: A | Discharge status: Home / Self Care | Payer: 59 | Source: Ambulatory Visit | Attending: Radiation Oncology | Admitting: Radiation Oncology

## 2020-11-21 ENCOUNTER — Inpatient Hospital Stay (HOSPITAL_COMMUNITY): Payer: 59

## 2020-11-21 ENCOUNTER — Ambulatory Visit
Admission: RE | Admit: 2020-11-21 | Discharge: 2020-11-21 | Disposition: A | Discharge status: Home / Self Care | Payer: 59 | Source: Ambulatory Visit | Attending: Radiation Oncology | Admitting: Radiation Oncology

## 2020-11-21 DIAGNOSIS — E785 Hyperlipidemia, unspecified: Secondary | ICD-10-CM | POA: Insufficient documentation

## 2020-11-21 DIAGNOSIS — Z79811 Long term (current) use of aromatase inhibitors: Secondary | ICD-10-CM | POA: Insufficient documentation

## 2020-11-21 DIAGNOSIS — C50919 Malignant neoplasm of unspecified site of unspecified female breast: Secondary | ICD-10-CM

## 2020-11-21 DIAGNOSIS — Z170 Estrogen receptor positive status [ER+]: Secondary | ICD-10-CM | POA: Insufficient documentation

## 2020-11-21 DIAGNOSIS — R590 Localized enlarged lymph nodes: Secondary | ICD-10-CM | POA: Insufficient documentation

## 2020-11-21 DIAGNOSIS — Z510 Encounter for antineoplastic radiation therapy: Secondary | ICD-10-CM | POA: Insufficient documentation

## 2020-11-21 DIAGNOSIS — C6962 Malignant neoplasm of left orbit: Secondary | ICD-10-CM

## 2020-11-21 DIAGNOSIS — C7949 Secondary malignant neoplasm of other parts of nervous system: Secondary | ICD-10-CM | POA: Insufficient documentation

## 2020-11-21 DIAGNOSIS — H538 Other visual disturbances: Secondary | ICD-10-CM | POA: Insufficient documentation

## 2020-11-21 DIAGNOSIS — R918 Other nonspecific abnormal finding of lung field: Secondary | ICD-10-CM | POA: Insufficient documentation

## 2020-11-21 DIAGNOSIS — H532 Diplopia: Secondary | ICD-10-CM | POA: Insufficient documentation

## 2020-11-21 DIAGNOSIS — C786 Secondary malignant neoplasm of retroperitoneum and peritoneum: Secondary | ICD-10-CM | POA: Insufficient documentation

## 2020-11-21 DIAGNOSIS — Z9012 Acquired absence of left breast and nipple: Secondary | ICD-10-CM | POA: Insufficient documentation

## 2020-11-21 DIAGNOSIS — F17210 Nicotine dependence, cigarettes, uncomplicated: Secondary | ICD-10-CM | POA: Insufficient documentation

## 2020-11-21 DIAGNOSIS — R630 Anorexia: Secondary | ICD-10-CM | POA: Insufficient documentation

## 2020-11-21 DIAGNOSIS — R140 Abdominal distension (gaseous): Secondary | ICD-10-CM | POA: Insufficient documentation

## 2020-11-21 DIAGNOSIS — Z853 Personal history of malignant neoplasm of breast: Secondary | ICD-10-CM | POA: Insufficient documentation

## 2020-11-21 DIAGNOSIS — R188 Other ascites: Secondary | ICD-10-CM | POA: Insufficient documentation

## 2020-11-21 DIAGNOSIS — I10 Essential (primary) hypertension: Secondary | ICD-10-CM | POA: Insufficient documentation

## 2020-11-21 DIAGNOSIS — I700 Atherosclerosis of aorta: Secondary | ICD-10-CM | POA: Insufficient documentation

## 2020-11-21 DIAGNOSIS — C50812 Malignant neoplasm of overlapping sites of left female breast: Secondary | ICD-10-CM | POA: Insufficient documentation

## 2020-11-21 DIAGNOSIS — M8580 Other specified disorders of bone density and structure, unspecified site: Secondary | ICD-10-CM | POA: Insufficient documentation

## 2020-11-21 DIAGNOSIS — Z79899 Other long term (current) drug therapy: Secondary | ICD-10-CM | POA: Insufficient documentation

## 2020-11-21 DIAGNOSIS — R634 Abnormal weight loss: Secondary | ICD-10-CM | POA: Insufficient documentation

## 2020-11-21 LAB — PH, BODY FLUID: pH, Body Fluid: 7.5

## 2020-11-21 LAB — COMPREHENSIVE METABOLIC PANEL
ALT: 18 U/L (ref 0–44)
AST: 51 U/L — ABNORMAL HIGH (ref 15–41)
Albumin: 3.2 g/dL — ABNORMAL LOW (ref 3.5–5.0)
Alkaline Phosphatase: 267 U/L — ABNORMAL HIGH (ref 38–126)
Anion gap: 10 (ref 5–15)
BUN: 19 mg/dL (ref 8–23)
CO2: 23 mmol/L (ref 22–32)
Calcium: 8.8 mg/dL — ABNORMAL LOW (ref 8.9–10.3)
Chloride: 103 mmol/L (ref 98–111)
Creatinine, Ser: 1.31 mg/dL — ABNORMAL HIGH (ref 0.44–1.00)
GFR, Estimated: 46 mL/min — ABNORMAL LOW (ref >60)
Glucose, Bld: 92 mg/dL (ref 70–99)
Potassium: 4.1 mmol/L (ref 3.5–5.1)
Sodium: 136 mmol/L (ref 135–145)
Total Bilirubin: 0.1 mg/dL — ABNORMAL LOW (ref 0.3–1.2)
Total Protein: 6.2 g/dL — ABNORMAL LOW (ref 6.5–8.1)

## 2020-11-21 LAB — CBC
HCT: 37 % (ref 36.0–46.0)
Hemoglobin: 11.4 g/dL — ABNORMAL LOW (ref 12.0–15.0)
MCH: 27.3 pg (ref 26.0–34.0)
MCHC: 30.8 g/dL (ref 30.0–36.0)
MCV: 88.7 fL (ref 80.0–100.0)
Platelets: 497 10*3/uL — ABNORMAL HIGH (ref 150–400)
RBC: 4.17 MIL/uL (ref 3.87–5.11)
RDW: 15.9 % — ABNORMAL HIGH (ref 11.5–15.5)
WBC: 8.9 10*3/uL (ref 4.0–10.5)
nRBC: 0 % (ref 0.0–0.2)

## 2020-11-21 LAB — CYTOLOGY - NON PAP

## 2020-11-21 LAB — RETICULOCYTES
Immature Retic Fract: 19.7 % — ABNORMAL HIGH (ref 2.3–15.9)
RBC.: 4.17 MIL/uL (ref 3.87–5.11)
Retic Count, Absolute: 58.8 10*3/uL (ref 19.0–186.0)
Retic Ct Pct: 1.4 % (ref 0.4–3.1)

## 2020-11-21 LAB — TRIGLYCERIDES, BODY FLUIDS: Triglycerides, Fluid: 16 mg/dL

## 2020-11-21 LAB — FERRITIN: Ferritin: 885 ng/mL — ABNORMAL HIGH (ref 11–307)

## 2020-11-21 MED ORDER — ANASTROZOLE 1 MG PO TABS: 1.0000 mg | ORAL_TABLET | Freq: Every day | ORAL | 0 refills | Status: DC

## 2020-11-21 MED ORDER — LIDOCAINE HCL 1 % IJ SOLN
INTRAMUSCULAR | Status: AC
  Filled 2020-11-21: qty 20

## 2020-11-21 MED ORDER — HYDROCODONE-ACETAMINOPHEN 5-325 MG PO TABS: 1.0000 | ORAL_TABLET | ORAL | 0 refills | Status: DC | PRN

## 2020-11-21 NOTE — Plan of Care (Signed)
  Problem: Education: Goal: Knowledge of General Education information will improve Description: Including pain rating scale, medication(s)/side effects and non-pharmacologic comfort measures Outcome: Progressing   Problem: Nutrition: Goal: Adequate nutrition will be maintained Outcome: Progressing   Problem: Elimination: Goal: Will not experience complications related to bowel motility Outcome: Progressing Goal: Will not experience complications related to urinary retention Outcome: Progressing

## 2020-11-21 NOTE — Progress Notes (Signed)
Pt care assumed at 1330 this shift. I have reviewed the previous RN's assessment and agree with their findings. Pt resting comfortably in the room in no acute distress. Will continue to monitor.

## 2020-11-21 NOTE — Discharge Summary (Signed)
Physician Discharge Summary  Carolyn Barron UXN:[id number] DOB: [birth year] DOA: 11/19/2020  PCP: [name], DO  Admit date: 11/19/2020 Discharge date: 11/21/2020  Admitted From: Home Disposition: Home  Recommendations for Outpatient Follow-up:  1. Follow up with PCP in 1-2 weeks 2. Please obtain BMP/CBC in one week 3. Please follow up with oncology and radiation oncology as scheduled  Discharge Condition: Stable CODE STATUS: Full Diet recommendation: As tolerated  Brief/Interim Summary: Carolyn Barron a 60 y.o.femalewith medical history significant forremote history of breast cancer s/p left mastectomy, hypertension, hyperlipidemia and depression who presents with concerns of increasing abdominal bloating and shortness of breath. 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 This was deemed unrelated to her palsy. A repeat MRI with contrast was obtained which revealed a mass behind her left eye. Unfortunately, this MRI is not in our system. She states then the neurosurgeon obtained CT abdomen and CT chest which shows potential widespread metastasis. For the past 2 to 3 weeks she has noticed increased abdominal bloating and dry heaving as well as diarrhea. This has become progressively worse to the point where she can no longer walk without increasing shortness of breath. She has been unable to eat due to nausea and dry heaving. Thinks she has lost about 10 pounds or more in the past month. Patient has remote history of breast cancer and was previously on maintenance tamoxifen but has lost follow-up with oncology for some time due to loss of insurance. Patient reports continuous use of tobacco 1 pack/day. She denies any alcohol or illicit drug use. 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 There is also a small to moderate left pleural effusion.  Patient admitted as above with acute intractable abdominal pain ascites, imaging remarkable as above with multiple metastatic lesions with unknown primary.  She does have history of breast cancer, heme-onc, radiation oncology were consulted with plan for radiation to left eye given vision changes obtained by neurosurgery in the outpatient setting.  She will otherwise need to follow-up closely with heme-onc for further outpatient testing and likely a biopsy at some point to further delineate her metastatic disease if paracentesis fluid is nondiagnostic.  Patient's ascites was drained on 11/20/2020 without 9 L paracentesis, pleural effusions appear to be resolving on clinical exam patient remains without hypoxia.  We discussed she may continue to require recurrent paracentesis until further evaluation and treatment but these can be set up as an outpatient endeavor through PCP, heme-onc or even radiation oncology.  Patient is otherwise stable and agreeable for discharge home.  Discharge Diagnoses:  Principal Problem:   Ascites Active Problems:   Pleural effusion   AKI (acute kidney injury) (HCC)   Anemia   Depression   GERD (gastroesophageal reflux disease)   Lymphadenopathy, mediastinal   Pulmonary nodule   Adnexal mass    Discharge Instructions  Discharge Instructions    Call MD for:  difficulty breathing, headache or visual disturbances   Complete by: As directed    Call MD for:  extreme fatigue   Complete by: As directed    Call MD for:  severe uncontrolled pain   Complete by: As directed    Diet general   Complete by: As directed    Increase activity slowly  Complete by: As directed      Allergies as of 11/21/2020      Reactions   Clindamycin/lincomycin Rash      Medication List    STOP taking these medications   tamoxifen 20 MG tablet Commonly known as: NOLVADEX      TAKE these medications   acetaminophen 500 MG tablet Commonly known as: TYLENOL Take 500 mg by mouth every 6 (six) hours as needed for moderate pain.   albuterol 108 (90 Base) MCG/ACT inhaler Commonly known as: VENTOLIN HFA Inhale 2 puffs into the lungs every 6 (six) hours as needed for wheezing or shortness of breath.   anastrozole 1 MG tablet Commonly known as: ARIMIDEX Take 1 tablet (1 mg total) by mouth daily. Start taking on: Nov 22, 2020   atorvastatin 20 MG tablet Commonly known as: LIPITOR Take 20 mg by mouth daily.   cholecalciferol 25 MCG (1000 UNIT) tablet Commonly known as: VITAMIN D3 Take 1,000 Units by mouth daily.   famotidine 20 MG tablet Commonly known as: PEPCID Take 20 mg by mouth daily.   HYDROcodone-acetaminophen 5-325 MG tablet Commonly known as: NORCO/VICODIN Take 1-2 tablets by mouth every 4 (four) hours as needed for severe pain or moderate pain.   Loratadine 10 MG Caps Take 1 capsule by mouth daily as needed.   pantoprazole 20 MG tablet Commonly known as: PROTONIX Take 20 mg by mouth daily.   PARoxetine 40 MG tablet Commonly known as: PAXIL Take 40 mg by mouth every morning.       Allergies  Allergen Reactions  . Clindamycin/Lincomycin Rash    Consultations:  Heme-onc, [place]: radiation oncology   Procedures/Studies: CT CHEST ABDOMEN PELVIS W CONTRAST  Result Date: 11/15/2020 CLINICAL DATA:  Left orbital tumor.  Bloating with loss of appetite. EXAM: CT CHEST, ABDOMEN, AND PELVIS WITH CONTRAST TECHNIQUE: Multidetector CT imaging of the chest, abdomen and pelvis was performed following the standard protocol during bolus administration of intravenous contrast. CONTRAST:  70mL ISOVUE-370 IOPAMIDOL (ISOVUE-370) INJECTION 76% COMPARISON:  CTA Chest 04/16/2012 FINDINGS: Creatinine was obtained on site at [facility] at [address] Results: Creatinine 1.5 mg/dL ----------------------------------- CT CHEST FINDINGS  Cardiovascular: The heart size is normal. Trace pericardial effusion. Coronary artery calcification is evident. Atherosclerotic calcification is noted in the wall of the thoracic aorta. Mediastinum/Nodes: Bulky mediastinal lymphadenopathy evident. 2 cm short axis right paratracheal node evident. 2.3 cm short axis lymph node identified in the prevascular space/AP window. There is a 2.7 cm short axis node in the precarinal station an 2.6 cm lymph node evident in the subcarinal location. Mild lymphadenopathy noted right hilum no bulky left hilar adenopathy. The esophagus has normal imaging features. There is no axillary lymphadenopathy. Surgical clips are seen in the left axilla. Lungs/Pleura: 4 mm pulmonary nodule identified right lower lobe on 70/5. 5 mm perifissural nodule seen in the left lower lobe on 62/5. Small to moderate left pleural effusion evident. Musculoskeletal: Multiple lucent thoracic spine lesions demonstrate peripheral sclerosis, highly suspicious for metastatic disease. 11 mm lucent lesion noted in the manubrium on 36/5. Index 17 mm lesion noted in the T5 vertebral body (45/5). CT ABDOMEN PELVIS FINDINGS Hepatobiliary: No suspicious focal abnormality within the liver parenchyma. There is no evidence for gallstones, gallbladder wall thickening, or pericholecystic fluid. No intrahepatic or extrahepatic biliary dilation. Pancreas: No focal mass lesion. No dilatation of the main duct. No intraparenchymal cyst. No peripancreatic edema. Spleen: No splenomegaly. No focal mass lesion. Adrenals/Urinary Tract: No adrenal nodule  or mass. Kidneys unremarkable No evidence for hydroureter. The urinary bladder appears normal for the degree of distention. Stomach/Bowel: Stomach is nondistended which may contribute to the appearance of diffuse wall thickening. Duodenum is normally positioned as is the ligament of Treitz. No small bowel wall thickening. No small bowel dilatation. The terminal ileum is normal. The  appendix is normal. Focal apparent wall thickening is noted in the cecum (image 83/2 and coronal 99/6). Remaining segments of the colon are decompressed. Vascular/Lymphatic: There is abdominal aortic atherosclerosis without aneurysm. 17 mm short axis portal caval lymph node identified on image 59/2. Other similar hepatoduodenal ligament lymphadenopathy evident. Lymphadenopathy noted in the ileocolic ligament (images 80 and 77 of series 2). Upper normal para-aortic retroperitoneal nodes identified. No pelvic sidewall lymphadenopathy Reproductive: The uterus is unremarkable. 4.4 x 3.5 x 5.4 cm soft tissue lesion is identified in the right adnexal space. Left adnexal space unremarkable. Other: Markedly large volume ascites. Bandlike areas of soft tissue nodularity are seen in the omentum (axial 57/2 and 69/2). Probable focus of peritoneal nodularity in the right cul-de-sac on 102/2. Musculoskeletal: Markedly heterogeneous mineralization of the bony anatomy suggest metastatic involvement. 18 mm lucent lesion identified in the right iliac bone adjacent to the SI joint. IMPRESSION: 1. Bulky mediastinal lymphadenopathy consistent with metastatic disease. There is mild right hilar lymphadenopathy. 2. Tiny bilateral pulmonary nodules, indeterminate. Attention on follow-up recommended. 3. Markedly large volume ascites with bandlike areas of linear nodularity in the omentum bilaterally, concerning for metastatic involvement. There is lymphadenopathy in the hepatoduodenal ligament and ileocolic mesentery. Probable peritoneal nodule in the right cul-de-sac. 4. 5 cm soft tissue lesion in the right adnexal space may be related to ovarian primary or metastatic involvement. 5. Suggestion of diffuse wall thickening in the stomach with associated underdistention making the finding somewhat indeterminate. Upper endoscopy may prove helpful to further evaluate. 6. There is also suggestion of wall thickening in the cecum although colon is  not well distended. Correlation with colorectal cancer screening history recommended. Electronically Signed   By: Misty Stanley M.D.   On: 11/15/2020 15:47   US Paracentesis  Result Date: 11/21/2020 INDICATION: Abdominal distension. Request for therapeutic and diagnostic paracentesis. EXAM: ULTRASOUND GUIDED  PARACENTESIS MEDICATIONS: 10 mL 1% lidocaine COMPLICATIONS: None immediate. PROCEDURE: Informed written consent was obtained from the patient after a discussion of the risks, benefits and alternatives to treatment. A timeout was performed prior to the initiation of the procedure. Initial ultrasound scanning demonstrates a large amount of ascites within the left lower abdominal quadrant. The left lower abdomen was prepped and draped in the usual sterile fashion. 1% lidocaine was used for local anesthesia. Following this, a 19 gauge, 7-cm, Yueh catheter was introduced. An ultrasound image was saved for documentation purposes. The paracentesis was performed. The catheter was removed and a dressing was applied. The patient tolerated the procedure well without immediate post procedural complication. Patient received post-procedure intravenous albumin; see nursing notes for details. FINDINGS: A total of approximately 9 L of clear pale yellow fluid was removed. Samples were sent to the laboratory as requested by the clinical team. IMPRESSION: Successful ultrasound-guided paracentesis yielding 9 liters of peritoneal fluid. Read by: Durenda Guthrie, PA-C Electronically Signed   By: Lucrezia Europe M.D.   On: 11/20/2020 11:35     Subjective: No acute issues or events overnight, abdominal pain resolving denies nausea vomiting diarrhea constipation headache fevers chills chest pain or shortness of breath.   Discharge Exam: Vitals:   11/20/20 2034 11/21/20 [id number]  BP: (!) 155/86 118/62  Pulse: 99 83  Resp: (!) 21 20  Temp: 99.9 F (37.7 C) 98.1 F (36.7 C)  SpO2: 95% 96%   Vitals:   11/20/20 1018 11/20/20 1255  11/20/20 2034 11/21/20 0537  BP: 121/67 134/71 (!) 155/86 118/62  Pulse:  (!) 103 99 83  Resp:  20 (!) 21 20  Temp:  98.5 F (36.9 C) 99.9 F (37.7 C) 98.1 F (36.7 C)  TempSrc:  Oral Oral Oral  SpO2:  97% 95% 96%    General: Pt is alert, awake, not in acute distress Cardiovascular: RRR, S1/S2 +, no rubs, no gallops Respiratory: CTA bilaterally, no wheezing, no rhonchi Abdominal: Soft, NT, ND, bowel sounds + Extremities: no edema, no cyanosis    The results of significant diagnostics from this hospitalization (including imaging, microbiology, ancillary and laboratory) are listed below for reference.     Microbiology: Recent Results (from the past 240 hour(s))  SARS CORONAVIRUS 2 (TAT 6-24 HRS) Nasopharyngeal Nasopharyngeal Swab     Status: None   Collection Time: 11/19/20 10:00 PM   Specimen: Nasopharyngeal Swab  Result Value Ref Range Status   SARS Coronavirus 2 NEGATIVE NEGATIVE Final    Comment: (NOTE) SARS-CoV-2 target nucleic acids are NOT DETECTED.  The SARS-CoV-2 RNA is generally detectable in upper and lower respiratory specimens during the acute phase of infection. Negative results do not preclude SARS-CoV-2 infection, do not rule out co-infections with other pathogens, and should not be used as the sole basis for treatment or other patient management decisions. Negative results must be combined with clinical observations, patient history, and epidemiological information. The expected result is Negative.  Fact Sheet for Patients: SugarRoll.be  Fact Sheet for Healthcare Providers: https://www.woods-mathews.com/  This test is not yet approved or cleared by the [place] FDA and  has been authorized for detection and/or diagnosis of SARS-CoV-2 by FDA under an Emergency Use Authorization (EUA). This EUA will remain  in effect (meaning this test can be used) for the duration of the COVID-19 declaration under Se ction  564(b)(1) of the Act, 21 U.S.C. section 360bbb-3(b)(1), unless the authorization is terminated or revoked sooner.  Performed at [facility], [place] [address] [place], [place] [address]   Aerobic/Anaerobic Culture w Gram Stain (surgical/deep wound)     Status: None (Preliminary result)   Collection Time: 11/20/20 10:00 AM   Specimen: PATH Cytology Peritoneal fluid  Result Value Ref Range Status   Specimen Description   Final    PERITONEAL Performed at [facility] [address] [place], [place] [address]    Special Requests   Final    NONE Performed at [facility], [place] [address], [place], [place] [address]    Gram Stain NO WBC SEEN NO ORGANISMS SEEN   Final   Culture   Final    NO GROWTH < 24 HOURS Performed at [facility], [place] [address] [place], [place] [address]    Report Status PENDING  Incomplete     Labs: BNP (last 3 results) No results for input(s): BNP in the last 8760 hours. Basic Metabolic Panel: Recent Labs  Lab 11/19/20 1950 11/20/20 0353 11/21/20 0317  NA 137 138 136  K 4.2 4.1 4.1  CL 102 105 103  CO2 24 24 23   GLUCOSE 103* 90 92  BUN 20 20 19   CREATININE 1.30* 1.37* 1.31*  CALCIUM 8.8* 8.7* 8.8*   Liver Function Tests: Recent Labs  Lab  11/19/20 1950 11/21/20 0317  AST 41 51*  ALT 10 18  ALKPHOS 183* 267*  BILITOT 0.3 0.1*  PROT 6.6 6.2*  ALBUMIN 2.7* 3.2*   Recent Labs  Lab 11/19/20 1950  LIPASE 44   No results for input(s): AMMONIA in the last 168 hours. CBC: Recent Labs  Lab 11/19/20 1950 11/20/20 0353 11/21/20 0317  WBC 8.7 8.2 8.9  NEUTROABS 6.5  --   --   HGB 10.7* 10.0* 11.4*  HCT 34.9* 32.3* 37.0  MCV 87.5 88.0 88.7  PLT 524* 461* 497*   Cardiac Enzymes: No results for input(s): CKTOTAL, CKMB, CKMBINDEX, TROPONINI in the last 168 hours. BNP: Invalid input(s): POCBNP CBG: No results for input(s): GLUCAP in the last 168 hours. D-Dimer No results for  input(s): DDIMER in the last 72 hours. Hgb A1c No results for input(s): HGBA1C in the last 72 hours. Lipid Profile No results for input(s): CHOL, HDL, LDLCALC, TRIG, CHOLHDL, LDLDIRECT in the last 72 hours. Thyroid function studies No results for input(s): TSH, T4TOTAL, T3FREE, THYROIDAB in the last 72 hours.  Invalid input(s): FREET3 Anemia work up Recent Labs    11/20/20 0353 11/21/20 0317  VITAMINB12 146*  --   FOLATE 2.7*  --   FERRITIN  --  885*  TIBC 204*  --   IRON 39  --   RETICCTPCT  --  1.4   Urinalysis    Component Value Date/Time   COLORURINE YELLOW 04/16/2012 1637   APPEARANCEUR CLOUDY (A) 04/16/2012 1637   LABSPEC 1.022 04/16/2012 1637   PHURINE 6.0 04/16/2012 1637   GLUCOSEU NEGATIVE 04/16/2012 1637   HGBUR NEGATIVE 04/16/2012 1637   BILIRUBINUR NEGATIVE 04/16/2012 1637   KETONESUR NEGATIVE 04/16/2012 1637   PROTEINUR NEGATIVE 04/16/2012 1637   UROBILINOGEN 0.2 04/16/2012 1637   NITRITE NEGATIVE 04/16/2012 1637   LEUKOCYTESUR NEGATIVE 04/16/2012 1637   Sepsis Labs Invalid input(s): PROCALCITONIN,  WBC,  LACTICIDVEN Microbiology Recent Results (from the past 240 hour(s))  SARS CORONAVIRUS 2 (TAT 6-24 HRS) Nasopharyngeal Nasopharyngeal Swab     Status: None   Collection Time: 11/19/20 10:00 PM   Specimen: Nasopharyngeal Swab  Result Value Ref Range Status   SARS Coronavirus 2 NEGATIVE NEGATIVE Final    Comment: (NOTE) SARS-CoV-2 target nucleic acids are NOT DETECTED.  The SARS-CoV-2 RNA is generally detectable in upper and lower respiratory specimens during the acute phase of infection. Negative results do not preclude SARS-CoV-2 infection, do not rule out co-infections with other pathogens, and should not be used as the sole basis for treatment or other patient management decisions. Negative results must be combined with clinical observations, patient history, and epidemiological information. The expected result is Negative.  Fact Sheet for  Patients: SugarRoll.be  Fact Sheet for Healthcare Providers: https://www.woods-mathews.com/  This test is not yet approved or cleared by the [place] FDA and  has been authorized for detection and/or diagnosis of SARS-CoV-2 by FDA under an Emergency Use Authorization (EUA). This EUA will remain  in effect (meaning this test can be used) for the duration of the COVID-19 declaration under Se ction 564(b)(1) of the Act, 21 U.S.C. section 360bbb-3(b)(1), unless the authorization is terminated or revoked sooner.  Performed at [facility], [place] [address] [place], [place] [address]   Aerobic/Anaerobic Culture w Gram Stain (surgical/deep wound)     Status: None (Preliminary result)   Collection Time: 11/20/20 10:00 AM   Specimen: PATH Cytology Peritoneal fluid  Result Value Ref Range Status   Specimen  Description   Final    PERITONEAL Performed at [facility] [address] [place], [place] [address]    Special Requests   Final    NONE Performed at [facility], [place] [address] [place], [place] [address]    Gram Stain NO WBC SEEN NO ORGANISMS SEEN   Final   Culture   Final    NO GROWTH < 24 HOURS Performed at [facility], [place] [address] [place], [place] [address]    Report Status PENDING  Incomplete     Time coordinating discharge: Over 30 minutes  SIGNED:   Little Ishikawa, DO [facility] 11/21/2020, 1:12 PM Pager   If 7PM-7AM, please contact night-coverage [email or web address]

## 2020-11-21 NOTE — Progress Notes (Signed)
Chaplain engaged in an initial visit with [place].  Chaplain provided education on Advanced Directive and was able to complete paperwork.  Chaplain provided three copies to [place] which we laid near her belongings while she is receiving radiation.  Chaplain also gave one to nurse to input in her chart.  Lorenzo wants her daughter to serve as her healthcare POA.  Chaplain checked in with Kayzlee regarding her health and diagnosis.  She is in a place of wanting to fight to live but also accepting whatever God wills.  Chaplain offered support and listening.     11/21/20 1300  Clinical Encounter Type  Visited With Patient  Visit Type Initial;Social support;Spiritual support

## 2020-11-21 NOTE — Progress Notes (Signed)
AVS given to patient and explained at the bedside. Medications and follow up appointments have been explained with pt verbalizing understanding.  

## 2020-11-21 NOTE — Progress Notes (Signed)
Radiation Oncology         [phone] ________________________________  Initial Inpatient Consultation  Name: Carolyn Barron MRN: [id number]  Date: 11/21/2020  DOB: [birth year]  EV:[id number], Hinton Dyer, DO  Magrinat, Virgie Dad, MD   REFERRING PHYSICIAN: Magrinat, Virgie Dad, MD  DIAGNOSIS:    ICD-10-CM   1. Metastatic breast cancer ([place])  C50.919   2. 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 Repeat MRI was obtained, which revealed a mass behind her left eye.  This was reviewed at our CNS tumor board and the consensus was that she should be seen for palliative radiation therapy.  I have personally reviewed her imaging  CT scan of chest, abdomen, and pelvis on 11/14/2020 showed bulky mediastinal lymphadenopathy that was consistent with metastatic disease. There was also noted to be mild right hilar lymphadenopathy and tiny, indeterminate, bilateral pulmonary nodules. Additionally, there was markedly large volume ascites with band-like areas of linear nodularity in the omentum bilaterally, concerning for metastatic involvement, with lymphadenopathy in the hepatoduodenal ligament and ileocolic mesentery; probable peritoneal nodule in the right cul-de-sac. Finally, there was a 5 cm soft tissue lesion in the right adnexal space that could  be related to ovarian primary or metastatic involvement, indeterminate diffuse wall thickening in the stomach with associated under-distention, and wall thickening in the cecum although colon was not well distended.  Most recently,  she was seen in the ED on 11/19/2020 for abdominal bloating/distension and melena. She was admitted to the hospital for unspecific metastatic malignant neoplasm, dehydration, AKI, and malignant ascites.  Records show that 9 L was removed from her abdomen during paracentesis after admission, cytology pending.  She has been seen by medical oncology, Dr. Jana Hakim.  He is starting her empirically on anastrozole pending pathologic confirmation of metastatic breast cancer.  He will follow-up with her as an outpatient.  Patient's main complaints related to symptomatic tumor(s) are:  --Acute onset of a left oculomotor palsy (noticed by patient's opthalmologist) --Per Dr. Consuella Lose: (10/20/2020) Briefly, the patient tells me that she noticed her left eye drooping about two months ago without any identifiable inciting event. There is no associated trauma. In addition to her eyelid drooping, she noted double vision. She was seen by her eye doctor and referred to the emergency department where MRA revealed possible small aneurysm. She was therefore referred to me for further evaluation  Pain on a scale of 0-10 is: Patient denies any pain or discomfort to her left eye   Ambulatory status? Walker? Wheelchair?: States she is able to ambulate unassisted. Occasionally feels unsteady on her feet, but denies any recent slips or falls  SAFETY ISSUES:  Prior radiation? No  Pacemaker/ICD? No  Possible current pregnancy? No--postmenopausal  Is the patient on methotrexate? No   PREVIOUS RADIATION THERAPY: No  PAST MEDICAL HISTORY:  has  a past medical history of Breast cancer ([place]), Cancer ([place]), Hypertension, and Vertigo.    PAST SURGICAL HISTORY: Past Surgical History:   Procedure Laterality Date  . CARPAL TUNNEL RELEASE    . MASTECTOMY      FAMILY HISTORY: family history includes Lung cancer in her father and paternal grandmother.  SOCIAL HISTORY:  reports that she has been smoking cigarettes. She has been smoking about 0.50 packs per day. She has never used smokeless tobacco. She reports that she does not drink alcohol and does not use drugs.  ALLERGIES: Clindamycin/lincomycin  MEDICATIONS:  Current Outpatient Medications  Medication Sig Dispense Refill  . acetaminophen (TYLENOL) 500 MG tablet Take 500 mg by mouth every 6 (six) hours as needed for moderate pain.    Marland Kitchen albuterol (VENTOLIN HFA) 108 (90 Base) MCG/ACT inhaler Inhale 2 puffs into the lungs every 6 (six) hours as needed for wheezing or shortness of breath.    . anastrozole (ARIMIDEX) 1 MG tablet Take 1 tablet (1 mg total) by mouth daily. 30 tablet 0  . atorvastatin (LIPITOR) 20 MG tablet Take 20 mg by mouth daily.    . cholecalciferol (VITAMIN D3) 25 MCG (1000 UNIT) tablet Take 1,000 Units by mouth daily.    . famotidine (PEPCID) 20 MG tablet Take 20 mg by mouth daily.    Marland Kitchen HYDROcodone-acetaminophen (NORCO/VICODIN) 5-325 MG tablet Take 1-2 tablets by mouth every 4 (four) hours as needed for severe pain or moderate pain. 30 tablet 0  . Loratadine 10 MG CAPS Take 1 capsule by mouth daily as needed.    . pantoprazole (PROTONIX) 20 MG tablet Take 20 mg by mouth daily.    Marland Kitchen PARoxetine (PAXIL) 40 MG tablet Take 40 mg by mouth every morning.     No current facility-administered medications for this encounter.    REVIEW OF SYSTEMS:  Notable for that above.   PHYSICAL EXAM:  vitals were not taken for this visit.   General: Alert and oriented, in no acute distress  HEENT: Head is normocephalic.  Blurry vision noted in left eye. Extraocular movements are not intact per left eye, with markedly reduced medial and inferior gaze.  Gaze is also reduced superiorly and laterally.  Left ptosis.  Markedly  reduced pupillary reflex - left eye. Oropharynx is clear. Neurologic: See above.   Speech is fluent.  She is alert and oriented Psychiatric: Judgment and insight are intact. Affect is appropriate.   ECOG = 2  0 - Asymptomatic (Fully active, able to carry on all predisease activities without restriction)  1 - Symptomatic but completely ambulatory (Restricted in physically strenuous activity but ambulatory and able to carry out work of a light or sedentary nature. For example, light housework, office work)  2 - Symptomatic, <50% in bed during the day (Ambulatory and capable of all self care but unable to carry out any work activities. Up and about more than 50% of waking hours)  3 - Symptomatic, >50% in bed, but not bedbound (Capable of only limited self-care, confined to bed or chair 50% or more of waking hours)  4 - Bedbound (Completely disabled. Cannot carry on any self-care. Totally confined to bed or chair)  5 - Death   Eustace Pen MM, Creech RH, Tormey DC, et al. [phone]). "Toxicity and response criteria of the [facility]". [place] Oncol. 5 (6): 649-55   LABORATORY DATA:  Lab Results  Component Value Date   WBC 8.9 11/21/2020   HGB 11.4 (L) 11/21/2020  HCT 37.0 11/21/2020   MCV 88.7 11/21/2020   PLT 497 (H) 11/21/2020   CMP     Component Value Date/Time   NA 136 11/21/2020 0317   NA 142 09/25/2014 1532   K 4.1 11/21/2020 0317   K 4.2 09/25/2014 1532   CL 103 11/21/2020 0317   CO2 23 11/21/2020 0317   CO2 27 09/25/2014 1532   GLUCOSE 92 11/21/2020 0317   GLUCOSE 88 09/25/2014 1532   BUN 19 11/21/2020 0317   BUN 14.1 09/25/2014 1532   CREATININE 1.31 (H) 11/21/2020 0317   CREATININE 0.9 09/25/2014 1532   CALCIUM 8.8 (L) 11/21/2020 0317   CALCIUM 9.4 09/25/2014 1532   PROT 6.2 (L) 11/21/2020 0317   PROT 7.1 09/25/2014 1532   ALBUMIN 3.2 (L) 11/21/2020 0317   ALBUMIN 3.6 09/25/2014 1532   AST 51 (H) 11/21/2020 0317   AST 11 09/25/2014 1532    ALT 18 11/21/2020 0317   ALT 9 09/25/2014 1532   ALKPHOS 267 (H) 11/21/2020 0317   ALKPHOS 106 09/25/2014 1532   BILITOT 0.1 (L) 11/21/2020 0317   BILITOT <0.20 09/25/2014 1532   GFRNONAA 46 (L) 11/21/2020 0317   GFRAA >90 04/16/2012 1521         RADIOGRAPHY: [place] CHEST (PLEURAL EFFUSION)  Result Date: 11/21/2020 CLINICAL DATA:  Left pleural effusion. EXAM: CHEST ULTRASOUND COMPARISON:  CT of the chest on 11/14/2020 FINDINGS: Imaging in a sitting upright position was performed demonstrating a small left pleural effusion. Fluid volume was not felt large enough to warrant attempt at thoracentesis today. IMPRESSION: Small volume left pleural effusion. There was not enough fluid present for attempt at thoracentesis today. Electronically Signed   By: Aletta Edouard M.D.   On: 11/21/2020 15:57   CT CHEST ABDOMEN PELVIS W CONTRAST  Result Date: 11/15/2020 CLINICAL DATA:  Left orbital tumor.  Bloating with loss of appetite. EXAM: CT CHEST, ABDOMEN, AND PELVIS WITH CONTRAST TECHNIQUE: Multidetector CT imaging of the chest, abdomen and pelvis was performed following the standard protocol during bolus administration of intravenous contrast. CONTRAST:  2mL ISOVUE-370 IOPAMIDOL (ISOVUE-370) INJECTION 76% COMPARISON:  CTA Chest 04/16/2012 FINDINGS: Creatinine was obtained on site at [facility] at [address] Results: Creatinine 1.5 mg/dL ----------------------------------- CT CHEST FINDINGS Cardiovascular: The heart size is normal. Trace pericardial effusion. Coronary artery calcification is evident. Atherosclerotic calcification is noted in the wall of the thoracic aorta. Mediastinum/Nodes: Bulky mediastinal lymphadenopathy evident. 2 cm short axis right paratracheal node evident. 2.3 cm short axis lymph node identified in the prevascular space/AP window. There is a 2.7 cm short axis node in the precarinal station an 2.6 cm lymph node evident in the subcarinal location. Mild lymphadenopathy  noted right hilum no bulky left hilar adenopathy. The esophagus has normal imaging features. There is no axillary lymphadenopathy. Surgical clips are seen in the left axilla. Lungs/Pleura: 4 mm pulmonary nodule identified right lower lobe on 70/5. 5 mm perifissural nodule seen in the left lower lobe on 62/5. Small to moderate left pleural effusion evident. Musculoskeletal: Multiple lucent thoracic spine lesions demonstrate peripheral sclerosis, highly suspicious for metastatic disease. 11 mm lucent lesion noted in the manubrium on 36/5. Index 17 mm lesion noted in the T5 vertebral body (45/5). CT ABDOMEN PELVIS FINDINGS Hepatobiliary: No suspicious focal abnormality within the liver parenchyma. There is no evidence for gallstones, gallbladder wall thickening, or pericholecystic fluid. No intrahepatic or extrahepatic biliary dilation. Pancreas: No focal mass lesion. No dilatation of the main duct. No intraparenchymal  cyst. No peripancreatic edema. Spleen: No splenomegaly. No focal mass lesion. Adrenals/Urinary Tract: No adrenal nodule or mass. Kidneys unremarkable No evidence for hydroureter. The urinary bladder appears normal for the degree of distention. Stomach/Bowel: Stomach is nondistended which may contribute to the appearance of diffuse wall thickening. Duodenum is normally positioned as is the ligament of Treitz. No small bowel wall thickening. No small bowel dilatation. The terminal ileum is normal. The appendix is normal. Focal apparent wall thickening is noted in the cecum (image 83/2 and coronal 99/6). Remaining segments of the colon are decompressed. Vascular/Lymphatic: There is abdominal aortic atherosclerosis without aneurysm. 17 mm short axis portal caval lymph node identified on image 59/2. Other similar hepatoduodenal ligament lymphadenopathy evident. Lymphadenopathy noted in the ileocolic ligament (images 80 and 77 of series 2). Upper normal para-aortic retroperitoneal nodes identified. No pelvic  sidewall lymphadenopathy Reproductive: The uterus is unremarkable. 4.4 x 3.5 x 5.4 cm soft tissue lesion is identified in the right adnexal space. Left adnexal space unremarkable. Other: Markedly large volume ascites. Bandlike areas of soft tissue nodularity are seen in the omentum (axial 57/2 and 69/2). Probable focus of peritoneal nodularity in the right cul-de-sac on 102/2. Musculoskeletal: Markedly heterogeneous mineralization of the bony anatomy suggest metastatic involvement. 18 mm lucent lesion identified in the right iliac bone adjacent to the SI joint. IMPRESSION: 1. Bulky mediastinal lymphadenopathy consistent with metastatic disease. There is mild right hilar lymphadenopathy. 2. Tiny bilateral pulmonary nodules, indeterminate. Attention on follow-up recommended. 3. Markedly large volume ascites with bandlike areas of linear nodularity in the omentum bilaterally, concerning for metastatic involvement. There is lymphadenopathy in the hepatoduodenal ligament and ileocolic mesentery. Probable peritoneal nodule in the right cul-de-sac. 4. 5 cm soft tissue lesion in the right adnexal space may be related to ovarian primary or metastatic involvement. 5. Suggestion of diffuse wall thickening in the stomach with associated underdistention making the finding somewhat indeterminate. Upper endoscopy may prove helpful to further evaluate. 6. There is also suggestion of wall thickening in the cecum although colon is not well distended. Correlation with colorectal cancer screening history recommended. Electronically Signed   By: Misty Stanley M.D.   On: 11/15/2020 15:47   US Paracentesis  Result Date: 11/21/2020 INDICATION: Abdominal distension. Request for therapeutic and diagnostic paracentesis. EXAM: ULTRASOUND GUIDED  PARACENTESIS MEDICATIONS: 10 mL 1% lidocaine COMPLICATIONS: None immediate. PROCEDURE: Informed written consent was obtained from the patient after a discussion of the risks, benefits and  alternatives to treatment. A timeout was performed prior to the initiation of the procedure. Initial ultrasound scanning demonstrates a large amount of ascites within the left lower abdominal quadrant. The left lower abdomen was prepped and draped in the usual sterile fashion. 1% lidocaine was used for local anesthesia. Following this, a 19 gauge, 7-cm, Yueh catheter was introduced. An ultrasound image was saved for documentation purposes. The paracentesis was performed. The catheter was removed and a dressing was applied. The patient tolerated the procedure well without immediate post procedural complication. Patient received post-procedure intravenous albumin; see nursing notes for details. FINDINGS: A total of approximately 9 L of clear pale yellow fluid was removed. Samples were sent to the laboratory as requested by the clinical team. IMPRESSION: Successful ultrasound-guided paracentesis yielding 9 liters of peritoneal fluid. Read by: Durenda Guthrie, PA-C Electronically Signed   By: Lucrezia Europe M.D.   On: 11/20/2020 11:35      IMPRESSION/PLAN: This is a lovely 60 year old woman with a distant history of breast cancer now with clinical metastatic  disease, including the left orbit, favored to be related to her history of breast cancer.  Cytology is pending from recent paracentesis.  Given her symptoms, I recommend that we proceed with empiric palliative radiation therapy to preserve and ideally improve the function in her left eye.  She understands there is a very small chance that her disease is not neoplastic.  She would like to proceed with treatment as soon as reasonably feasible.  We will simulate her today and start her treatment on Monday, May 9.  Anticipate 2 weeks of palliative radiation therapy to a dose of 30 Gray in 10 fractions targeting the left orbit. We discussed the patient's diagnosis of  and general treatment for this, highlighting the role of radiotherapy in the management. We discussed the  available radiation techniques, and focused on the details of logistics and delivery.    We discussed the risks, benefits, and side effects of  radiotherapy. Side effects may include but not necessarily be limited to: Skin irritation in the radiation fields, hair loss in the radiation fields including the eyebrows and eyelashes, watering of the eye, eye irritation, fatigue,  injury to the eye or rare loss of vision. No guarantees of treatment were given. A consent form was signed and placed in the patient's medical record. The patient was encouraged to ask questions that I answered to the best of my ability. No guarantees of treatment were made.  She understands the treatment intent will be palliative.  We will proceed with CT simulation as recommended and start her treatment on May 9.  She expressed deep appreciation for our care today.  I was also able to speak with her daughter and son-in-law about my assessment and plan and they are supportive.   On date of service, in total, I spent 40 minutes on this encounter. Patient was seen in person.    __________________________________________   Eppie Gibson, MD  This document serves as a record of services personally performed by Eppie Gibson, MD. It was created on his behalf by Clerance Lav, a trained medical scribe. The creation of this record is based on the scribe's personal observations and the provider's statements to them. This document has been checked and approved by the attending provider.

## 2020-11-21 NOTE — Progress Notes (Signed)
Histology and Location of Primary Cancer:  Malignant neoplasm of LEFTbreast, estrogen receptor positive  Location(s) of Symptomatic tumor(s):  MRI Head w and w/o Contrast  10/01/2020 --IMPRESSION: 1. 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 2. Otherwise unremarkable intracranial MRA.  Past/Anticipated chemotherapy by medical oncology, if any:  Care resumed by Dr. Sarajane Jews Magriant: 11/20/2020  She took antiestrogens briefly, was then lost to follow-up, last seen by [place] March 2016; now presenting with evidence of widespread metastatic disease as described above.  The pattern of disease spread now is consistent with metastatic lobular breast cancer, which spares the lung and liver but infiltrates the peritoneum, pulmonary surfaces, periorbital areas, etc.   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  The patient understand this disease is not curable at present but it is treatable and the treatment is well tolerated, inexpensive and effective.   I am starting her on anastrozole pending pathologic confirmation. If pathology confirms the diagnosis, will add abemaciclib as outpatient. I have written for tumor markers to serve as baseline and will request chaplain visit to help the patient complete a HCPOA.  She will benefit from a brief course of XRT to the left orbit.  I will see the patient again 5/9 but if she is discharged before then I will make sure she has follow-up with me next week  Patient's main complaints related to symptomatic tumor(s) are:  --Acute onset of a left oculomotor palsy (noticed by patient's opthalmologist) --Per Dr. Consuella Lose: (10/20/2020) Briefly, the patient tells me that she noticed her left eye drooping about two months ago without any identifiable  inciting event. There is no associated trauma. In addition to her eyelid drooping, she noted double vision. She was seen by her eye doctor and referred to the emergency department where MRA revealed possible small aneurysm. She was therefore referred to me for further evaluation  Pain on a scale of 0-10 is: Patient denies any pain or discomfort to her left eye   Ambulatory status? Walker? Wheelchair?: States she is able to ambulate unassisted. Occasionally feels unsteady on her feet, but denies any recent slips or falls  SAFETY ISSUES:  Prior radiation? No  Pacemaker/ICD? No  Possible current pregnancy? No--postmenopausal  Is the patient on methotrexate? No  Additional Complaints / other details:   11/20/2020: patient underwent an ultrasound-guided paracentesis earlier today with 9 L of fluid removed.  Fluid has been sent for cytology.

## 2020-11-21 NOTE — Progress Notes (Signed)
Interventional Radiology Brief Note:  Patient to [facility] this afternoon for possible thoracentesis.  She is s/p large volume- 9L paracentesis yesterday.  Limited US Chest today shows pleural fluid largely decompressed s/p paracentesis with only a small pocket present overlaid by lung flap. No procedure performed.  Patient returned to unit.  Ordering MD notified via secure chat.   Brynda Greathouse, MS RD PA-C

## 2020-11-22 LAB — ACID FAST SMEAR (AFB, MYCOBACTERIA): Acid Fast Smear: NEGATIVE

## 2020-11-22 LAB — CEA: CEA: 6.2 ng/mL — ABNORMAL HIGH (ref 0.0–4.7)

## 2020-11-22 LAB — CANCER ANTIGEN 27.29: CA 27.29: 151.8 U/mL — ABNORMAL HIGH (ref 0.0–38.6)

## 2020-11-22 LAB — CA 125: Cancer Antigen (CA) 125: 1108 U/mL — ABNORMAL HIGH (ref 0.0–38.1)

## 2020-11-23 ENCOUNTER — Encounter: Payer: Self-pay | Admitting: Oncology

## 2020-11-23 NOTE — Progress Notes (Signed)
ID: [name]   DOB: [birth year]  MR#: [id number]  XTK#:[id number]  Patient Care Team: Carolyn Morning, DO as PCP - General (Family Medicine) Carolyn Carolyn Barron Lose, MD as Consulting Physician (Neurosurgery) Carolyn Gibson, MD as Consulting Physician (Radiation Oncology) Carolyn Carolyn Barron Carolyn Barron, Carolyn Dad, MD as Consulting Physician (Oncology) OTHER MD:  CHIEF COMPLAINT: now metastatic cancer (s/p remote left mastectomy)  CURRENT TREATMENT: Work-up in progress; on anastrozole   INTERVAL HISTORY: Carolyn Carolyn Barron Carolyn Barron returns today for re-evaluation of her now metastatic breast cancer. She was last seen here in 2016 and was subsequently lost to follow up.  She was referred to Dr. Kathyrn Carolyn Barron in neurology for acute onset of oculomotor palsy. Brain MRI was performed revealing an intra and extraconal left orbital mass and multiple calvarial lesions.  (I do not have a direct record of that study).  She proceeded to CT chest, abdomen, pelvis on 11/14/2020 showing: bulky mediastinal lymphadenopathy consistent with metastatic disease, mild right hilar lymphadenopathy; indeterminate bilateral pulmonary nodules; markedly large volume ascites with bandlike areas of linear nodularity in omentum bilaterally, lymphadenopathy in hepatoduodenal ligament and ileocolic mesentery, probable peritoneal nodule in right cul-de-sac; 5 cm soft tissue lesion in right adnexal space; indeterminate diffuse wall thickening in stomach with associated underdistention; suggestion of wall thickening of cecum although colon is not well distended.  She presented to the ED on 11/19/2020 with dark stools, abdominal pain, and distention. She was admitted for IV fluids and observation. I saw the patient on 11/20/2020 and started her on anastrozole. She also underwent paracentesis that day, withy cytology ([id number]) showing reactive mesothelial cells.  The patient has been referred to radiation oncology and will undergo simulation today for palliative treatment of the left  periorbital mass.  She was started on anastrozole at the recent admission, 11/20/2020, pending definitive diagnosis   REVIEW OF SYSTEMS: Carolyn Carolyn Barron Carolyn Barron looks much better today than she did in the hospital.  She does not feel the ascites is quickly reaccumulating.  She has had no further worsening of her left visual problems and just started radiation.  She is tolerating anastrozole with no side effects that she is aware of.  For Mother's Day she and her mother treated themselves to Carolyn Carolyn Barron Carolyn Barron.  She had a friend bring her today and transportation may be an issue in the future.  A detailed review of systems was otherwise stable   COVID 19 VACCINATION STATUS: Status post vaccine x2   BREAST CANCER HISTORY: From the original intake note:  Carolyn Carolyn Barron Carolyn Barron felt a lump in her left breast after a shower in late October. She brought it to Dr. Eugenio Barron attention, and was set up for mammography November 1 at [facility].  Dr. Sadie Barron was able to feel some thickening at 1 o'clock in the left breast about 5 cm from the left nipple, and by mammography there was a spiculated mass there corresponding to the palpable finding.  Ultrasound showed this to be irregular, and to measure approximately 1.6 cm.  The left axilla appeared normal.  Biopsy was performed the same day, and showed ([id number]) an invasive mammary carcinoma with lobular features, which was ER+ at 100%, PR+ at 99%, with a low proliferation marker at 10%, and HER2 negative with a ratio of 1.13.   With this information, the patient was referred to Carolyn Carolyn Barron Carolyn Barron and breast MRI was obtained November 3.  This showed a large area of non-mass enhancement within the upper-outer and upper-inner quadrants of the left breast, much larger than the abnormality found by physical examination,  ultrasound or mammography. The question was then raised whether to proceed to biopsy of the edges of this mass to ascertain for respectability, or to proceed directly to  mastectomy, and the patient much preferred the latter, so mastectomy was performed November 12 with sentinel lymph node biopsy, the final pathology showing ([id number]) a 1.8 cm invasive lobular carcinoma, grade 2, with no evidence of lymphovascular invasion and ample margins.  The sentinel lymph node was negative.   Her subsequent history is as detailed below.   PAST MEDICAL HISTORY: Past Medical History:  Diagnosis Date  . Breast cancer ([place])   . Cancer ([place])   . Hypertension   . Vertigo   The past medical history is significant for hyperlipidemia, remote history of Carolyn Barron stones, history of asthma, history of osteopenia, history of carpal tunnel repair, history of trigger finger repair (both these two by Dr. Daylene Carolyn Barron), and history of continuing tobacco abuse.     PAST SURGICAL HISTORY: Past Surgical History:  Procedure Laterality Date  . CARPAL TUNNEL RELEASE    . MASTECTOMY      FAMILY HISTORY Family History  Problem Relation Age of Onset  . Lung cancer Father        smoker  . Lung cancer Paternal Grandmother   The patient's father died from lung cancer.  He was [facility] at [facility].  He was a smoker.  The patient's mother is in fair health (age 87 as of March 2016).  The patient has one sister. The only cancer that she knows of in the family is the patient's father's mother's, who had lung cancer. (The patient is a distant cousin of Carolyn Carolyn Barron Carolyn Barron, who of course died from breast cancer.)    GYNECOLOGIC HISTORY: She is GX P1, first pregnancy to term at age 51.  Last menstrual period was 2005.  She never took hormone replacement.    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    Her husband, Carolyn Carolyn Barron Carolyn Barron, died suddenly 04/26/13.  The patient's daughter, Carolyn Carolyn Barron Carolyn Barron, is currently living in [place].  Her husband is in the [employer].  They have 4 children.  The patient attends the [employer] Carolyn Carolyn Barron Carolyn Barron is one of  the pastors and Carolyn Carolyn Barron Carolyn Barron is also working there).     ADVANCED DIRECTIVES: Not in place   HEALTH MAINTENANCE: Social History   Tobacco Use  . Smoking status: Current Every Day Smoker    Packs/day: 0.50    Types: Cigarettes  . Smokeless tobacco: Never Used  Substance Use Topics  . Alcohol use: No  . Drug use: No     Colonoscopy:  PAP:  Bone density:  Lipid panel:  Allergies  Allergen Reactions  . Clindamycin/Lincomycin Rash    Current Outpatient Medications  Medication Sig Dispense Refill  . acetaminophen (TYLENOL) 500 MG tablet Take 500 mg by mouth every 6 (six) hours as needed for moderate pain.    Marland Kitchen albuterol (VENTOLIN HFA) 108 (90 Base) MCG/ACT inhaler Inhale 2 puffs into the lungs every 6 (six) hours as needed for wheezing or shortness of breath.    . anastrozole (ARIMIDEX) 1 MG tablet Take 1 tablet (1 mg total) by mouth daily. 30 tablet 0  . atorvastatin (LIPITOR) 20 MG tablet Take 20 mg by mouth daily.    . cholecalciferol (VITAMIN D3) 25 MCG (1000 UNIT) tablet Take 1,000 Units by mouth daily.    . famotidine (PEPCID) 20 MG tablet Take 20 mg  by mouth daily.    Marland Kitchen HYDROcodone-acetaminophen (NORCO/VICODIN) 5-325 MG tablet Take 1-2 tablets by mouth every 4 (four) hours as needed for severe pain or moderate pain. 30 tablet 0  . Loratadine 10 MG CAPS Take 1 capsule by mouth daily as needed.    . pantoprazole (PROTONIX) 20 MG tablet Take 20 mg by mouth daily.    Marland Kitchen PARoxetine (PAXIL) 40 MG tablet Take 40 mg by mouth every Carolyn Barron.     No current facility-administered medications for this visit.    OBJECTIVE: White woman in no acute distress Vitals:   11/24/20 1529  BP: 128/70  Pulse: (!) 107  Resp: 18  Temp: 98.2 F (36.8 C)  SpO2: 99%     Body mass index is 25.7 kg/m.    ECOG FS: 1  Sclerae unicteric, EOMs intact Wearing a mask No cervical or supraclavicular adenopathy Lungs no rales or rhonchi Heart regular rate and rhythm Abd soft, not significantly  distended, nontender, no palpable mass MSK no focal spinal tenderness, no upper extremity lymphedema Neuro: nonfocal, well oriented, appropriate affect Breasts: Deferred; breast exam last week was unremarkable   LAB RESULTS: Lab Results  Component Value Date   WBC 8.9 11/21/2020   NEUTROABS 6.5 11/19/2020   HGB 11.4 (L) 11/21/2020   HCT 37.0 11/21/2020   MCV 88.7 11/21/2020   PLT 497 (H) 11/21/2020      Chemistry      Component Value Date/Time   NA 136 11/21/2020 0317   NA 142 09/25/2014 1532   K 4.1 11/21/2020 0317   K 4.2 09/25/2014 1532   CL 103 11/21/2020 0317   CO2 23 11/21/2020 0317   CO2 27 09/25/2014 1532   BUN 19 11/21/2020 0317   BUN 14.1 09/25/2014 1532   CREATININE 1.31 (H) 11/21/2020 0317   CREATININE 0.9 09/25/2014 1532      Component Value Date/Time   CALCIUM 8.8 (L) 11/21/2020 0317   CALCIUM 9.4 09/25/2014 1532   ALKPHOS 267 (H) 11/21/2020 0317   ALKPHOS 106 09/25/2014 1532   AST 51 (H) 11/21/2020 0317   AST 11 09/25/2014 1532   ALT 18 11/21/2020 0317   ALT 9 09/25/2014 1532   BILITOT 0.1 (L) 11/21/2020 0317   BILITOT <0.20 09/25/2014 1532       Lab Results  Component Value Date   LABCA2 14 03/19/2010    No components found for: [id number]  No results for input(s): INR in the last 168 hours.  Urinalysis    Component Value Date/Time   COLORURINE YELLOW 04/16/2012 1637   APPEARANCEUR CLOUDY (A) 04/16/2012 1637   LABSPEC 1.022 04/16/2012 1637   PHURINE 6.0 04/16/2012 1637   GLUCOSEU NEGATIVE 04/16/2012 1637   HGBUR NEGATIVE 04/16/2012 1637   BILIRUBINUR NEGATIVE 04/16/2012 1637   KETONESUR NEGATIVE 04/16/2012 1637   PROTEINUR NEGATIVE 04/16/2012 1637   UROBILINOGEN 0.2 04/16/2012 1637   NITRITE NEGATIVE 04/16/2012 1637   LEUKOCYTESUR NEGATIVE 04/16/2012 1637    STUDIES: [place] CHEST (PLEURAL EFFUSION)  Result Date: 11/21/2020 CLINICAL DATA:  Left pleural effusion. EXAM: CHEST ULTRASOUND COMPARISON:  CT of the chest on 11/14/2020  FINDINGS: Imaging in a sitting upright position was performed demonstrating a small left pleural effusion. Fluid volume was not felt large enough to warrant attempt at thoracentesis today. IMPRESSION: Small volume left pleural effusion. There was not enough fluid present for attempt at thoracentesis today. Electronically Signed   By: Aletta Edouard M.D.   On: 11/21/2020 15:57   CT CHEST ABDOMEN  PELVIS W CONTRAST  Result Date: 11/15/2020 CLINICAL DATA:  Left orbital tumor.  Bloating with loss of appetite. EXAM: CT CHEST, ABDOMEN, AND PELVIS WITH CONTRAST TECHNIQUE: Multidetector CT imaging of the chest, abdomen and pelvis was performed following the standard protocol during bolus administration of intravenous contrast. CONTRAST:  90mL ISOVUE-370 IOPAMIDOL (ISOVUE-370) INJECTION 76% COMPARISON:  CTA Chest 04/16/2012 FINDINGS: Creatinine was obtained on site at [facility] at [address] Results: Creatinine 1.5 mg/dL ----------------------------------- CT CHEST FINDINGS Cardiovascular: The heart size is normal. Trace pericardial effusion. Coronary artery calcification is evident. Atherosclerotic calcification is noted in the wall of the thoracic aorta. Mediastinum/Nodes: Bulky mediastinal lymphadenopathy evident. 2 cm short axis right paratracheal node evident. 2.3 cm short axis lymph node identified in the prevascular space/AP window. There is a 2.7 cm short axis node in the precarinal station an 2.6 cm lymph node evident in the subcarinal location. Mild lymphadenopathy noted right hilum no bulky left hilar adenopathy. The esophagus has normal imaging features. There is no axillary lymphadenopathy. Surgical clips are seen in the left axilla. Lungs/Pleura: 4 mm pulmonary nodule identified right lower lobe on 70/5. 5 mm perifissural nodule seen in the left lower lobe on 62/5. Small to moderate left pleural effusion evident. Musculoskeletal: Multiple lucent thoracic spine lesions demonstrate  peripheral sclerosis, highly suspicious for metastatic disease. 11 mm lucent lesion noted in the manubrium on 36/5. Index 17 mm lesion noted in the T5 vertebral body (45/5). CT ABDOMEN PELVIS FINDINGS Hepatobiliary: No suspicious focal abnormality within the liver parenchyma. There is no evidence for gallstones, gallbladder wall thickening, or pericholecystic fluid. No intrahepatic or extrahepatic biliary dilation. Pancreas: No focal mass lesion. No dilatation of the main duct. No intraparenchymal cyst. No peripancreatic edema. Spleen: No splenomegaly. No focal mass lesion. Adrenals/Urinary Tract: No adrenal nodule or mass. Kidneys unremarkable No evidence for hydroureter. The urinary bladder appears normal for the degree of distention. Stomach/Bowel: Stomach is nondistended which may contribute to the appearance of diffuse wall thickening. Duodenum is normally positioned as is the ligament of Treitz. No small bowel wall thickening. No small bowel dilatation. The terminal ileum is normal. The appendix is normal. Focal apparent wall thickening is noted in the cecum (image 83/2 and coronal 99/6). Remaining segments of the colon are decompressed. Vascular/Lymphatic: There is abdominal aortic atherosclerosis without aneurysm. 17 mm short axis portal caval lymph node identified on image 59/2. Other similar hepatoduodenal ligament lymphadenopathy evident. Lymphadenopathy noted in the ileocolic ligament (images 80 and 77 of series 2). Upper normal para-aortic retroperitoneal nodes identified. No pelvic sidewall lymphadenopathy Reproductive: The uterus is unremarkable. 4.4 x 3.5 x 5.4 cm soft tissue lesion is identified in the right adnexal space. Left adnexal space unremarkable. Other: Markedly large volume ascites. Bandlike areas of soft tissue nodularity are seen in the omentum (axial 57/2 and 69/2). Probable focus of peritoneal nodularity in the right cul-de-sac on 102/2. Musculoskeletal: Markedly heterogeneous  mineralization of the bony anatomy suggest metastatic involvement. 18 mm lucent lesion identified in the right iliac bone adjacent to the SI joint. IMPRESSION: 1. Bulky mediastinal lymphadenopathy consistent with metastatic disease. There is mild right hilar lymphadenopathy. 2. Tiny bilateral pulmonary nodules, indeterminate. Attention on follow-up recommended. 3. Markedly large volume ascites with bandlike areas of linear nodularity in the omentum bilaterally, concerning for metastatic involvement. There is lymphadenopathy in the hepatoduodenal ligament and ileocolic mesentery. Probable peritoneal nodule in the right cul-de-sac. 4. 5 cm soft tissue lesion in the right adnexal space may be related to  ovarian primary or metastatic involvement. 5. Suggestion of diffuse wall thickening in the stomach with associated underdistention making the finding somewhat indeterminate. Upper endoscopy may prove helpful to further evaluate. 6. There is also suggestion of wall thickening in the cecum although colon is not well distended. Correlation with colorectal cancer screening history recommended. Electronically Signed   By: Misty Stanley M.D.   On: 11/15/2020 15:47   US Paracentesis  Result Date: 11/21/2020 INDICATION: Abdominal distension. Request for therapeutic and diagnostic paracentesis. EXAM: ULTRASOUND GUIDED  PARACENTESIS MEDICATIONS: 10 mL 1% lidocaine COMPLICATIONS: None immediate. PROCEDURE: Informed written consent was obtained from the patient after a discussion of the risks, benefits and alternatives to treatment. A timeout was performed prior to the initiation of the procedure. Initial ultrasound scanning demonstrates a large amount of ascites within the left lower abdominal quadrant. The left lower abdomen was prepped and draped in the usual sterile fashion. 1% lidocaine was used for local anesthesia. Following this, a 19 gauge, 7-cm, Yueh catheter was introduced. An ultrasound image was saved for  documentation purposes. The paracentesis was performed. The catheter was removed and a dressing was applied. The patient tolerated the procedure well without immediate post procedural complication. Patient received post-procedure intravenous albumin; see nursing notes for details. FINDINGS: A total of approximately 9 L of clear pale yellow fluid was removed. Samples were sent to the laboratory as requested by the clinical team. IMPRESSION: Successful ultrasound-guided paracentesis yielding 9 liters of peritoneal fluid. Read by: Durenda Guthrie, PA-C Electronically Signed   By: Lucrezia Europe M.D.   On: 11/20/2020 11:35    ASSESSMENT: 60 y.o. Carolyn Carolyn Barron Carolyn Barron woman status post left mastectomy and sentinel lymph node sampling in November 2010 for a T1c N0, Barron IA invasive lobular carcinoma, grade 2, strongly estrogen and progesterone receptor positive, HER-2/neu negative, with a low proliferation fraction.   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  (d) cytology from paracentesis 11/20/2020 nondiagnostic  (e) biopsy of right adnexal mass pending  (3) palliative radiation to the left periorbital mass  (4) anastrozole started empirically 11/20/2020  PLAN:  Carolyn Carolyn Barron Carolyn Barron is tolerating anastrozole well.  She understands that this can be effective in many cases of breast cancer and and sometimes also in cases ovarian cancer.  We do  not know if she has 1 of these 2 cancers or something else entirely and until we have a biopsy we will not really know how to treat her or be able to give her a clear prognosis.  We discussed her cytology from the paracentesis which was nondiagnostic.  The tumor markers are all elevated which is not helpful.  CA125 will be elevated anytime there is any abnormality in the abdomen.  The CA 27-29 is a little bit more specific and it does suggest breast cancer, but can be elevated also an ovarian cancer in short at this point we do not have a definitive diagnosis.  I have requested a CT biopsy of the adnexal  mass and hopefully it can be scheduled in the next few days.  She is scheduled for daily radiation treatments through the end of next week.  I will see her 12/05/2018.  Hopefully we will have a definitive diagnosis by then and we can proceed to a definitive treatment plan  At the next visit we will also discussed healthcare power of attorney issues and review COVID precautions  Total encounter time 25 minutes.Sarajane Jews C. Faryal Marxen MD Oncology and Hematology [facility] [place], [place] [address] Tel. [phone]  Joylene Igo [phone]   I, Wilburn Mylar, am acting as scribe for Dr. Sarajane Jews C. Desteni Piscopo.  I, Lurline Del MD, have reviewed the above documentation for accuracy and completeness, and I agree with the above.    *Total Encounter Time as defined by the Centers for Medicare and Medicaid Services includes, in addition to the face-to-face time of a patient visit (documented in the note above) non-face-to-face time: obtaining and reviewing outside history, ordering and reviewing medications, tests or procedures, care coordination (communications with other health care professionals or caregivers) and documentation in the medical record.

## 2020-11-24 ENCOUNTER — Other Ambulatory Visit: Payer: Self-pay

## 2020-11-24 ENCOUNTER — Inpatient Hospital Stay: Payer: 59

## 2020-11-24 ENCOUNTER — Inpatient Hospital Stay (HOSPITAL_BASED_OUTPATIENT_CLINIC_OR_DEPARTMENT_OTHER): Payer: 59 | Admitting: Oncology

## 2020-11-24 ENCOUNTER — Ambulatory Visit
Admission: RE | Admit: 2020-11-24 | Discharge: 2020-11-24 | Disposition: A | Discharge status: Home / Self Care | Payer: 59 | Source: Ambulatory Visit | Attending: Radiation Oncology | Admitting: Radiation Oncology

## 2020-11-24 VITALS — BP 128/70 | HR 107 | Temp 98.2°F | Resp 18 | Ht 62.0 in | Wt 140.5 lb

## 2020-11-24 DIAGNOSIS — Z79899 Other long term (current) drug therapy: Secondary | ICD-10-CM | POA: Diagnosis not present

## 2020-11-24 DIAGNOSIS — H538 Other visual disturbances: Secondary | ICD-10-CM | POA: Diagnosis not present

## 2020-11-24 DIAGNOSIS — N9489 Other specified conditions associated with female genital organs and menstrual cycle: Secondary | ICD-10-CM | POA: Diagnosis not present

## 2020-11-24 DIAGNOSIS — C786 Secondary malignant neoplasm of retroperitoneum and peritoneum: Secondary | ICD-10-CM | POA: Diagnosis not present

## 2020-11-24 DIAGNOSIS — C50812 Malignant neoplasm of overlapping sites of left female breast: Secondary | ICD-10-CM | POA: Diagnosis not present

## 2020-11-24 DIAGNOSIS — Z170 Estrogen receptor positive status [ER+]: Secondary | ICD-10-CM

## 2020-11-24 DIAGNOSIS — E538 Deficiency of other specified B group vitamins: Secondary | ICD-10-CM

## 2020-11-24 DIAGNOSIS — Z9012 Acquired absence of left breast and nipple: Secondary | ICD-10-CM | POA: Diagnosis not present

## 2020-11-24 DIAGNOSIS — E785 Hyperlipidemia, unspecified: Secondary | ICD-10-CM | POA: Diagnosis not present

## 2020-11-24 DIAGNOSIS — C7949 Secondary malignant neoplasm of other parts of nervous system: Secondary | ICD-10-CM | POA: Diagnosis present

## 2020-11-24 DIAGNOSIS — R590 Localized enlarged lymph nodes: Secondary | ICD-10-CM | POA: Diagnosis not present

## 2020-11-24 DIAGNOSIS — R188 Other ascites: Secondary | ICD-10-CM | POA: Diagnosis not present

## 2020-11-24 DIAGNOSIS — I10 Essential (primary) hypertension: Secondary | ICD-10-CM | POA: Diagnosis not present

## 2020-11-24 DIAGNOSIS — R180 Malignant ascites: Secondary | ICD-10-CM

## 2020-11-24 DIAGNOSIS — R634 Abnormal weight loss: Secondary | ICD-10-CM | POA: Diagnosis not present

## 2020-11-24 DIAGNOSIS — R630 Anorexia: Secondary | ICD-10-CM | POA: Diagnosis not present

## 2020-11-24 DIAGNOSIS — Z510 Encounter for antineoplastic radiation therapy: Secondary | ICD-10-CM | POA: Diagnosis present

## 2020-11-24 DIAGNOSIS — F17210 Nicotine dependence, cigarettes, uncomplicated: Secondary | ICD-10-CM | POA: Diagnosis not present

## 2020-11-24 DIAGNOSIS — H532 Diplopia: Secondary | ICD-10-CM | POA: Diagnosis not present

## 2020-11-24 DIAGNOSIS — Z79811 Long term (current) use of aromatase inhibitors: Secondary | ICD-10-CM | POA: Diagnosis not present

## 2020-11-24 DIAGNOSIS — Z853 Personal history of malignant neoplasm of breast: Secondary | ICD-10-CM | POA: Diagnosis present

## 2020-11-24 DIAGNOSIS — M8580 Other specified disorders of bone density and structure, unspecified site: Secondary | ICD-10-CM | POA: Diagnosis not present

## 2020-11-25 ENCOUNTER — Telehealth: Payer: Self-pay | Admitting: Oncology

## 2020-11-25 ENCOUNTER — Ambulatory Visit
Admission: RE | Admit: 2020-11-25 | Discharge: 2020-11-25 | Disposition: A | Discharge status: Home / Self Care | Payer: 59 | Source: Ambulatory Visit | Attending: Radiation Oncology | Admitting: Radiation Oncology

## 2020-11-25 ENCOUNTER — Encounter (HOSPITAL_COMMUNITY): Payer: Self-pay

## 2020-11-25 ENCOUNTER — Encounter: Payer: Self-pay | Admitting: Radiation Oncology

## 2020-11-25 ENCOUNTER — Encounter: Payer: Self-pay | Admitting: Licensed Clinical Social Worker

## 2020-11-25 DIAGNOSIS — Z510 Encounter for antineoplastic radiation therapy: Secondary | ICD-10-CM | POA: Diagnosis not present

## 2020-11-25 DIAGNOSIS — C6962 Malignant neoplasm of left orbit: Secondary | ICD-10-CM | POA: Insufficient documentation

## 2020-11-25 LAB — AEROBIC/ANAEROBIC CULTURE W GRAM STAIN (SURGICAL/DEEP WOUND)
Culture: NO GROWTH
Gram Stain: NONE SEEN

## 2020-11-25 NOTE — Progress Notes (Unsigned)
RE: Biopsy Received: Today  Message Details  Sandi Mariscal, MD  Suttle, Rosanne Ashing, MD; Leanna Battles Ir Procedure Requests Please approve for CT guided omental mass versus right adnexal mass biopsy (IR MD to evaluate and decide).   We will also perform a para at the time of the biopsy (assuming she has re-accumulated some fluid) but this will also be performed in CT (don't need to coordinate with [place]).   Cathren Harsh    Previous Messages  ----- Message -----  From: Suzette Battiest, MD  Sent: 11/25/2020  7:55 AM EDT  To: Lenore Cordia, Ir Procedure Requests  Subject: RE: Biopsy                    Difficult case. The right adnexal mass looks very well guarded. No great percutaneous lymph node targets. I think best approach may be repeat paracentesis followed immediately by CT biopsy of LUQ anterior mesenteric/peritoneal caking.    Any other thoughts?   Dylan  ----- Message -----  From: Lenore Cordia  Sent: 11/24/2020  4:55 PM EDT  To: Ir Procedure Requests  Subject: Biopsy                      Procedure Requested: CT Abdominal Mass Biopsy    Reason for Procedure: evaluate right adnexal mass, breast cancer vs ovarian cancer    Provider Requesting:  Dr Jana Hakim   Provider Telephone: [phone]

## 2020-11-25 NOTE — Telephone Encounter (Signed)
Scheduled appointment per 05/09 los. Patient is aware.

## 2020-11-25 NOTE — Progress Notes (Signed)
Carolyn Barron  Clinical Social Barron was referred by Dr. Jana Hakim for assessment of psychosocial needs.  Clinical Social Worker contacted patient by phone  to offer support and assess for needs.  Patient denies any resource needs at this time. A friend drove her to the appts yesterday, but she plans to drive herself to upcoming appts. No other needs voiced today. CSW provided direct contact information for patient to call regarding any future resource needs.      Carolyn Barron, Carolyn Barron Worker [employer]

## 2020-11-26 ENCOUNTER — Other Ambulatory Visit: Payer: Self-pay

## 2020-11-26 ENCOUNTER — Ambulatory Visit
Admission: RE | Admit: 2020-11-26 | Discharge: 2020-11-26 | Disposition: A | Discharge status: Home / Self Care | Payer: 59 | Source: Ambulatory Visit | Attending: Radiation Oncology | Admitting: Radiation Oncology

## 2020-11-26 DIAGNOSIS — Z510 Encounter for antineoplastic radiation therapy: Secondary | ICD-10-CM | POA: Diagnosis not present

## 2020-11-26 LAB — LIPASE, FLUID: Lipase-Fluid: 24 U/L

## 2020-11-27 ENCOUNTER — Ambulatory Visit
Admission: RE | Admit: 2020-11-27 | Discharge: 2020-11-27 | Disposition: A | Discharge status: Home / Self Care | Payer: 59 | Source: Ambulatory Visit | Attending: Radiation Oncology | Admitting: Radiation Oncology

## 2020-11-27 DIAGNOSIS — Z510 Encounter for antineoplastic radiation therapy: Secondary | ICD-10-CM | POA: Diagnosis not present

## 2020-11-28 ENCOUNTER — Ambulatory Visit
Admission: RE | Admit: 2020-11-28 | Discharge: 2020-11-28 | Disposition: A | Discharge status: Home / Self Care | Payer: 59 | Source: Ambulatory Visit | Attending: Radiation Oncology | Admitting: Radiation Oncology

## 2020-11-28 ENCOUNTER — Other Ambulatory Visit: Payer: Self-pay

## 2020-11-28 DIAGNOSIS — Z510 Encounter for antineoplastic radiation therapy: Secondary | ICD-10-CM | POA: Diagnosis not present

## 2020-12-01 ENCOUNTER — Other Ambulatory Visit: Payer: Self-pay

## 2020-12-01 ENCOUNTER — Ambulatory Visit
Admission: RE | Admit: 2020-12-01 | Discharge: 2020-12-01 | Disposition: A | Discharge status: Home / Self Care | Payer: 59 | Source: Ambulatory Visit | Attending: Radiation Oncology | Admitting: Radiation Oncology

## 2020-12-01 DIAGNOSIS — Z510 Encounter for antineoplastic radiation therapy: Secondary | ICD-10-CM | POA: Diagnosis not present

## 2020-12-02 ENCOUNTER — Ambulatory Visit
Admission: RE | Admit: 2020-12-02 | Discharge: 2020-12-02 | Disposition: A | Discharge status: Home / Self Care | Payer: 59 | Source: Ambulatory Visit | Attending: Radiation Oncology | Admitting: Radiation Oncology

## 2020-12-02 DIAGNOSIS — Z510 Encounter for antineoplastic radiation therapy: Secondary | ICD-10-CM | POA: Diagnosis not present

## 2020-12-03 ENCOUNTER — Ambulatory Visit
Admission: RE | Admit: 2020-12-03 | Discharge: 2020-12-03 | Disposition: A | Discharge status: Home / Self Care | Payer: 59 | Source: Ambulatory Visit | Attending: Radiation Oncology | Admitting: Radiation Oncology

## 2020-12-03 ENCOUNTER — Other Ambulatory Visit: Payer: Self-pay

## 2020-12-03 DIAGNOSIS — Z510 Encounter for antineoplastic radiation therapy: Secondary | ICD-10-CM | POA: Diagnosis not present

## 2020-12-03 NOTE — Progress Notes (Signed)
ID: TONI DEMO   DOB: January 11, 1961  MR#: [id number]  QPR#:[id number]  Patient Care Team: Carolyn Morning, DO as PCP - General (Family Medicine) Carolyn Lose, MD as Consulting Physician (Neurosurgery) Carolyn Gibson, MD as Consulting Physician (Radiation Oncology) Carolyn Carolyn Barron, Carolyn Dad, MD as Consulting Physician (Oncology) OTHER MD:  CHIEF COMPLAINT: now metastatic cancer (s/p remote left mastectomy)  CURRENT TREATMENT: Work-up in progress; on anastrozole; palliative radiation   INTERVAL HISTORY: Carolyn Carolyn Barron returns today for re-evaluation of her now metastatic breast cancer.   The patient is currently receiving palliative radiation treatment to the left periorbital mass under Dr. Isidore Barron. She will complete treatment tomorrow, 12/05/2020.  She tells me that she can now at least open the left eye.  She does have some blurred vision in that eye and when she looks to the side she has some double vision still.  She was started on anastrozole at the recent admission, 11/20/2020, pending definitive diagnosis.  She is having no side effects from this  She is scheduled for CT biopsy of the abdominal mass on 12/08/2020.   REVIEW OF SYSTEMS: Carolyn Carolyn Barron feels her abdomen is filling with fluid again but not to the point that she thinks she needs a repeat paracentesis.  She thinks perhaps in a couple of weeks she might.  Despite the visual changes she is very stable, has had no falls, and in any case she has a walking cane walker and wheelchair all at home since they were her Barron's.  She says she feels very tired and occasionally takes a nap.  She sleeps okay at night.  Her eating is a little bit down but her sense of taste is better.  She is not having any hot flashes or vaginal dryness issues associated from the anastrozole.  She has no pain except rarely in the lower right groin area.  She takes Tylenol for this.   COVID 19 VACCINATION STATUS: Status post vaccine x2   BREAST CANCER HISTORY: From the original  intake note:  Carolyn Carolyn Barron felt a lump in her left breast after a shower in late October. She brought it to Dr. Eugenio Barron attention, and was set up for mammography November 1 at the Carolyn Carolyn Barron.  Dr. Sadie Barron was able to feel some thickening at 1 o'clock in the left breast about 5 cm from the left nipple, and by mammography there was a spiculated mass there corresponding to the palpable finding.  Ultrasound showed this to be irregular, and to measure approximately 1.6 cm.  The left axilla appeared normal.  Biopsy was performed the same day, and showed (Carolyn Carolyn Barron and Carolyn Carolyn Barron) an invasive mammary carcinoma with lobular features, which was ER+ at 100%, PR+ at 99%, with a low proliferation marker at 10%, and HER2 negative with a ratio of 1.13.   With this information, the patient was referred to Carolyn Carolyn Barron and breast MRI was obtained November 3.  This showed a large area of non-mass enhancement within the upper-outer and upper-inner quadrants of the left breast, much larger than the abnormality found by physical examination, ultrasound or mammography. The question was then raised whether to proceed to biopsy of the edges of this mass to ascertain for respectability, or to proceed directly to mastectomy, and the patient much preferred the latter, so mastectomy was performed November 12 with sentinel lymph node biopsy, the final pathology showing (Carolyn Carolyn Barron) a 1.8 cm invasive lobular carcinoma, grade 2, with no evidence of lymphovascular invasion and ample margins.  The sentinel lymph node was negative.  Her subsequent history is as detailed below.   PAST MEDICAL HISTORY: Past Medical History:  Diagnosis Date  . Breast cancer (Carolyn Carolyn Barron)   . Cancer (Carolyn Barron)   . Hypertension   . Vertigo   The past medical history is significant for hyperlipidemia, remote history of Carolyn Barron stones, history of asthma, history of osteopenia, history of carpal tunnel repair, history of trigger finger repair (both these two by Dr. Daylene Katayama),  and history of continuing tobacco abuse.     PAST SURGICAL HISTORY: Past Surgical History:  Procedure Laterality Date  . CARPAL TUNNEL RELEASE    . MASTECTOMY      FAMILY HISTORY Family History  Problem Relation Age of Onset  . Lung cancer Father        smoker  . Lung cancer Paternal Grandmother   The patient's father died from lung cancer.  He was treated at [facility].  He was a smoker.  The patient's mother is in fair health (age 12 as of March 2016).  The patient has one sister. The only cancer that she knows of in the family is the patient's father's mother's, who had lung cancer. (The patient is a distant cousin of Carolyn Carolyn Barron, who of course died from breast cancer.)    GYNECOLOGIC HISTORY: She is GX P1, first pregnancy to term at age 47.  Last menstrual period was 2005.  She never took hormone replacement.    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    Her husband, Carolyn Carolyn Barron, died suddenly 04-29-13.  The patient's daughter, Carolyn Carolyn Barron, is currently living in [place].  Her husband is in the [employer].  They have 4 children.  The patient attends the [employer] Carolyn Carolyn Barron is one of the pastors and Carolyn Carolyn Barron is also working there).      ADVANCED DIRECTIVES: Not in place   HEALTH MAINTENANCE: Social History   Tobacco Use  . Smoking status: Current Every Day Smoker    Packs/day: 0.50    Types: Cigarettes  . Smokeless tobacco: Never Used  Substance Use Topics  . Alcohol use: No  . Drug use: No     Colonoscopy:  PAP:  Bone density:  Lipid panel:  Allergies  Allergen Reactions  . Clindamycin/Lincomycin Rash    Current Outpatient Medications  Medication Sig Dispense Refill  . acetaminophen (TYLENOL) 500 MG tablet Take 500 mg by mouth every 6 (six) hours as needed for moderate pain.    Marland Kitchen albuterol (VENTOLIN HFA) 108 (90 Base) MCG/ACT inhaler Inhale 2 puffs into the lungs every 6 (six)  hours as needed for wheezing or shortness of breath.    . anastrozole (ARIMIDEX) 1 MG tablet Take 1 tablet (1 mg total) by mouth daily. 30 tablet 0  . atorvastatin (LIPITOR) 20 MG tablet Take 20 mg by mouth daily.    . cholecalciferol (VITAMIN D3) 25 MCG (1000 UNIT) tablet Take 1,000 Units by mouth daily.    . famotidine (PEPCID) 20 MG tablet Take 20 mg by mouth daily.    Marland Kitchen HYDROcodone-acetaminophen (NORCO/VICODIN) 5-325 MG tablet Take 1-2 tablets by mouth every 4 (four) hours as needed for severe pain or moderate pain. 30 tablet 0  . Loratadine 10 MG CAPS Take 1 capsule by mouth daily as needed.    . pantoprazole (PROTONIX) 20 MG tablet Take 20 mg by mouth daily.    Marland Kitchen PARoxetine (PAXIL) 40 MG tablet Take 40 mg by mouth every Carolyn Barron.  No current facility-administered medications for this visit.    OBJECTIVE: White woman in no acute distress Vitals:   12/04/20 1026  BP: 128/60  Pulse: (!) 102  Resp: 19  Temp: (!) 97.5 F (36.4 C)  SpO2: 98%     Body mass index is 26.54 kg/m.    ECOG FS: 1  Sclerae unicteric, EOMs intact Wearing a mask No cervical or supraclavicular adenopathy Lungs no rales or rhonchi Heart regular rate and rhythm Abd soft, distended, no obvious fluid wave, nontender, positive bowel sounds MSK no focal spinal tenderness, no upper extremity lymphedema Neuro: nonfocal, well oriented, appropriate affect Breasts: The right breast is lumpy but there is no clear mass.  The left breast is status post mastectomy with no evidence of chest wall recurrence.  Both axillae are benign   LAB RESULTS: Lab Results  Component Value Date   WBC 8.9 11/21/2020   NEUTROABS 6.5 11/19/2020   HGB 11.4 (L) 11/21/2020   HCT 37.0 11/21/2020   MCV 88.7 11/21/2020   PLT 497 (H) 11/21/2020      Chemistry      Component Value Date/Time   NA 136 11/21/2020 0317   NA 142 09/25/2014 1532   K 4.1 11/21/2020 0317   K 4.2 09/25/2014 1532   CL 103 11/21/2020 0317   CO2 23  11/21/2020 0317   CO2 27 09/25/2014 1532   BUN 19 11/21/2020 0317   BUN 14.1 09/25/2014 1532   CREATININE 1.31 (H) 11/21/2020 0317   CREATININE 0.9 09/25/2014 1532      Component Value Date/Time   CALCIUM 8.8 (L) 11/21/2020 0317   CALCIUM 9.4 09/25/2014 1532   ALKPHOS 267 (H) 11/21/2020 0317   ALKPHOS 106 09/25/2014 1532   AST 51 (H) 11/21/2020 0317   AST 11 09/25/2014 1532   ALT 18 11/21/2020 0317   ALT 9 09/25/2014 1532   BILITOT 0.1 (L) 11/21/2020 0317   BILITOT <0.20 09/25/2014 1532       Lab Results  Component Value Date   LABCA2 14 03/19/2010    No components found for: [id number]  No results for input(s): INR in the last 168 hours.  Urinalysis    Component Value Date/Time   COLORURINE YELLOW 04/16/2012 1637   APPEARANCEUR CLOUDY (A) 04/16/2012 1637   LABSPEC 1.022 04/16/2012 1637   PHURINE 6.0 04/16/2012 1637   GLUCOSEU NEGATIVE 04/16/2012 1637   HGBUR NEGATIVE 04/16/2012 1637   BILIRUBINUR NEGATIVE 04/16/2012 1637   KETONESUR NEGATIVE 04/16/2012 1637   PROTEINUR NEGATIVE 04/16/2012 1637   UROBILINOGEN 0.2 04/16/2012 1637   NITRITE NEGATIVE 04/16/2012 1637   LEUKOCYTESUR NEGATIVE 04/16/2012 1637    STUDIES: [place] CHEST (PLEURAL EFFUSION)  Result Date: 11/21/2020 CLINICAL DATA:  Left pleural effusion. EXAM: CHEST ULTRASOUND COMPARISON:  CT of the chest on 11/14/2020 FINDINGS: Imaging in a sitting upright position was performed demonstrating a small left pleural effusion. Fluid volume was not felt large enough to warrant attempt at thoracentesis today. IMPRESSION: Small volume left pleural effusion. There was not enough fluid present for attempt at thoracentesis today. Electronically Signed   By: Aletta Edouard M.D.   On: 11/21/2020 15:57   CT CHEST ABDOMEN PELVIS W CONTRAST  Result Date: 11/15/2020 CLINICAL DATA:  Left orbital tumor.  Bloating with loss of appetite. EXAM: CT CHEST, ABDOMEN, AND PELVIS WITH CONTRAST TECHNIQUE: Multidetector CT imaging of the  chest, abdomen and pelvis was performed following the standard protocol during bolus administration of intravenous contrast. CONTRAST:  32m ISOVUE-370 IOPAMIDOL (  ISOVUE-370) INJECTION 76% COMPARISON:  CTA Chest 04/16/2012 FINDINGS: Creatinine was obtained on site at [facility] at [address] Results: Creatinine 1.5 mg/dL ----------------------------------- CT CHEST FINDINGS Cardiovascular: The heart size is normal. Trace pericardial effusion. Coronary artery calcification is evident. Atherosclerotic calcification is noted in the wall of the thoracic aorta. Mediastinum/Nodes: Bulky mediastinal lymphadenopathy evident. 2 cm short axis right paratracheal node evident. 2.3 cm short axis lymph node identified in the prevascular space/AP window. There is a 2.7 cm short axis node in the precarinal station an 2.6 cm lymph node evident in the subcarinal location. Mild lymphadenopathy noted right hilum no bulky left hilar adenopathy. The esophagus has normal imaging features. There is no axillary lymphadenopathy. Surgical clips are seen in the left axilla. Lungs/Pleura: 4 mm pulmonary nodule identified right lower lobe on 70/5. 5 mm perifissural nodule seen in the left lower lobe on 62/5. Small to moderate left pleural effusion evident. Musculoskeletal: Multiple lucent thoracic spine lesions demonstrate peripheral sclerosis, highly suspicious for metastatic disease. 11 mm lucent lesion noted in the manubrium on 36/5. Index 17 mm lesion noted in the T5 vertebral body (45/5). CT ABDOMEN PELVIS FINDINGS Hepatobiliary: No suspicious focal abnormality within the liver parenchyma. There is no evidence for gallstones, gallbladder wall thickening, or pericholecystic fluid. No intrahepatic or extrahepatic biliary dilation. Pancreas: No focal mass lesion. No dilatation of the main duct. No intraparenchymal cyst. No peripancreatic edema. Spleen: No splenomegaly. No focal mass lesion. Adrenals/Urinary Tract: No adrenal  nodule or mass. Kidneys unremarkable No evidence for hydroureter. The urinary bladder appears normal for the degree of distention. Stomach/Bowel: Stomach is nondistended which may contribute to the appearance of diffuse wall thickening. Duodenum is normally positioned as is the ligament of Treitz. No small bowel wall thickening. No small bowel dilatation. The terminal ileum is normal. The appendix is normal. Focal apparent wall thickening is noted in the cecum (image 83/2 and coronal 99/6). Remaining segments of the colon are decompressed. Vascular/Lymphatic: There is abdominal aortic atherosclerosis without aneurysm. 17 mm short axis portal caval lymph node identified on image 59/2. Other similar hepatoduodenal ligament lymphadenopathy evident. Lymphadenopathy noted in the ileocolic ligament (images 80 and 77 of series 2). Upper normal para-aortic retroperitoneal nodes identified. No pelvic sidewall lymphadenopathy Reproductive: The uterus is unremarkable. 4.4 x 3.5 x 5.4 cm soft tissue lesion is identified in the right adnexal space. Left adnexal space unremarkable. Other: Markedly large volume ascites. Bandlike areas of soft tissue nodularity are seen in the omentum (axial 57/2 and 69/2). Probable focus of peritoneal nodularity in the right cul-de-sac on 102/2. Musculoskeletal: Markedly heterogeneous mineralization of the bony anatomy suggest metastatic involvement. 18 mm lucent lesion identified in the right iliac bone adjacent to the SI joint. IMPRESSION: 1. Bulky mediastinal lymphadenopathy consistent with metastatic disease. There is mild right hilar lymphadenopathy. 2. Tiny bilateral pulmonary nodules, indeterminate. Attention on follow-up recommended. 3. Markedly large volume ascites with bandlike areas of linear nodularity in the omentum bilaterally, concerning for metastatic involvement. There is lymphadenopathy in the hepatoduodenal ligament and ileocolic mesentery. Probable peritoneal nodule in the  right cul-de-sac. 4. 5 cm soft tissue lesion in the right adnexal space may be related to ovarian primary or metastatic involvement. 5. Suggestion of diffuse wall thickening in the stomach with associated underdistention making the finding somewhat indeterminate. Upper endoscopy may prove helpful to further evaluate. 6. There is also suggestion of wall thickening in the cecum although colon is not well distended. Correlation with colorectal cancer screening history recommended.  Electronically Signed   By: Misty Stanley M.D.   On: 11/15/2020 15:47   US Paracentesis  Result Date: 11/21/2020 INDICATION: Abdominal distension. Request for therapeutic and diagnostic paracentesis. EXAM: ULTRASOUND GUIDED  PARACENTESIS MEDICATIONS: 10 mL 1% lidocaine COMPLICATIONS: None immediate. PROCEDURE: Informed written consent was obtained from the patient after a discussion of the risks, benefits and alternatives to treatment. A timeout was performed prior to the initiation of the procedure. Initial ultrasound scanning demonstrates a large amount of ascites within the left lower abdominal quadrant. The left lower abdomen was prepped and draped in the usual sterile fashion. 1% lidocaine was used for local anesthesia. Following this, a 19 gauge, 7-cm, Yueh catheter was introduced. An ultrasound image was saved for documentation purposes. The paracentesis was performed. The catheter was removed and a dressing was applied. The patient tolerated the procedure well without immediate post procedural complication. Patient received post-procedure intravenous albumin; see nursing notes for details. FINDINGS: A total of approximately 9 L of clear pale yellow fluid was removed. Samples were sent to the laboratory as requested by the clinical team. IMPRESSION: Successful ultrasound-guided paracentesis yielding 9 liters of peritoneal fluid. Read by: Durenda Guthrie, PA-C Electronically Signed   By: Lucrezia Europe M.D.   On: 11/20/2020 11:35     ASSESSMENT: 60 y.o. Carolyn Carolyn Barron woman status post left mastectomy and sentinel lymph node sampling in November 2010 for a T1c N0, Barron IA invasive lobular carcinoma, grade 2, strongly estrogen and progesterone receptor positive, HER-2/neu negative, with a low proliferation fraction.   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  (d) cytology from paracentesis 11/20/2020 nondiagnostic  (e) biopsy of right adnexal mass 12/08/2020  (f) on 11/21/2020 the CEA was 6.2, CA 27-29 was 151.8, and Ca1 25 was 1108  (3) palliative radiation to the left periorbital mass to be completed 12/05/2020  (4) anastrozole started empirically 11/20/2020   PLAN:  Carolyn Carolyn Barron is tolerating radiation well and is having a little bit of improvement in her vision.  She still has double vision and blurred vision and those may be permanent.  She has no side effects from the anastrozole.  The question is whether we will continue that and add Verzenio, or switch to ovarian cancer treatment.  We went over the tumor markers which are both elevated for breast and ovarian.  She is scheduled for biopsy 12/08/2020.  If she needs a paracentesis that day of course we can accomplish that at the same time  Otherwise she will see me again on 12/16/2020.  I think we will have a definitive diagnosis by then and can proceed to definitive  treatment  Total encounter time 25 minutes.Sarajane Jews C. Tashina Credit MD Oncology and Hematology [facility] [place], [place] [address] Tel. [phone]  Joylene Igo [phone]   I, Wilburn Mylar, am acting as scribe for Dr. Sarajane Jews C. Licia Harl.  Lindie Spruce MD, have reviewed the above documentation for accuracy and  completeness, and I agree with the above.   *Total Encounter Time as defined by the Centers for Medicare and Medicaid Services includes, in addition to the face-to-face time of a patient visit (documented in the note above) non-face-to-face time: obtaining and reviewing outside history, ordering and reviewing medications, tests or procedures, care coordination (communications with other health care professionals or caregivers) and documentation in the medical record.

## 2020-12-04 ENCOUNTER — Other Ambulatory Visit: Payer: Self-pay

## 2020-12-04 ENCOUNTER — Other Ambulatory Visit: Payer: Self-pay | Admitting: *Deleted

## 2020-12-04 ENCOUNTER — Telehealth: Payer: Self-pay | Admitting: *Deleted

## 2020-12-04 ENCOUNTER — Inpatient Hospital Stay: Payer: 59

## 2020-12-04 ENCOUNTER — Other Ambulatory Visit: Payer: Self-pay | Admitting: Radiology

## 2020-12-04 ENCOUNTER — Ambulatory Visit
Admission: RE | Admit: 2020-12-04 | Discharge: 2020-12-04 | Disposition: A | Discharge status: Home / Self Care | Payer: 59 | Source: Ambulatory Visit | Attending: Radiation Oncology | Admitting: Radiation Oncology

## 2020-12-04 ENCOUNTER — Telehealth: Payer: Self-pay | Admitting: Oncology

## 2020-12-04 ENCOUNTER — Inpatient Hospital Stay (HOSPITAL_BASED_OUTPATIENT_CLINIC_OR_DEPARTMENT_OTHER): Payer: 59 | Admitting: Oncology

## 2020-12-04 VITALS — BP 128/60 | HR 102 | Temp 97.5°F | Resp 19 | Wt 145.1 lb

## 2020-12-04 DIAGNOSIS — C50812 Malignant neoplasm of overlapping sites of left female breast: Secondary | ICD-10-CM

## 2020-12-04 DIAGNOSIS — C6962 Malignant neoplasm of left orbit: Secondary | ICD-10-CM

## 2020-12-04 DIAGNOSIS — N9489 Other specified conditions associated with female genital organs and menstrual cycle: Secondary | ICD-10-CM

## 2020-12-04 DIAGNOSIS — Z170 Estrogen receptor positive status [ER+]: Secondary | ICD-10-CM

## 2020-12-04 DIAGNOSIS — Z510 Encounter for antineoplastic radiation therapy: Secondary | ICD-10-CM | POA: Diagnosis not present

## 2020-12-04 DIAGNOSIS — R180 Malignant ascites: Secondary | ICD-10-CM

## 2020-12-04 DIAGNOSIS — E538 Deficiency of other specified B group vitamins: Secondary | ICD-10-CM

## 2020-12-04 LAB — CEA (IN HOUSE-CHCC): CEA (CHCC-In House): 7.31 ng/mL — ABNORMAL HIGH (ref 0.00–5.00)

## 2020-12-04 LAB — CBC WITH DIFFERENTIAL/PLATELET
Abs Immature Granulocytes: 0.11 10*3/uL — ABNORMAL HIGH (ref 0.00–0.07)
Basophils Absolute: 0 10*3/uL (ref 0.0–0.1)
Basophils Relative: 0 %
Eosinophils Absolute: 0 10*3/uL (ref 0.0–0.5)
Eosinophils Relative: 0 %
HCT: 29.6 % — ABNORMAL LOW (ref 36.0–46.0)
Hemoglobin: 9.4 g/dL — ABNORMAL LOW (ref 12.0–15.0)
Immature Granulocytes: 1 %
Lymphocytes Relative: 17 %
Lymphs Abs: 1.3 10*3/uL (ref 0.7–4.0)
MCH: 27.1 pg (ref 26.0–34.0)
MCHC: 31.8 g/dL (ref 30.0–36.0)
MCV: 85.3 fL (ref 80.0–100.0)
Monocytes Absolute: 0.4 10*3/uL (ref 0.1–1.0)
Monocytes Relative: 5 %
Neutro Abs: 5.9 10*3/uL (ref 1.7–7.7)
Neutrophils Relative %: 77 %
Platelets: 494 10*3/uL — ABNORMAL HIGH (ref 150–400)
RBC: 3.47 MIL/uL — ABNORMAL LOW (ref 3.87–5.11)
RDW: 16.5 % — ABNORMAL HIGH (ref 11.5–15.5)
WBC: 7.8 10*3/uL (ref 4.0–10.5)
nRBC: 0 % (ref 0.0–0.2)

## 2020-12-04 LAB — COMPREHENSIVE METABOLIC PANEL
ALT: 8 U/L (ref 0–44)
AST: 24 U/L (ref 15–41)
Albumin: 2.1 g/dL — ABNORMAL LOW (ref 3.5–5.0)
Alkaline Phosphatase: 226 U/L — ABNORMAL HIGH (ref 38–126)
Anion gap: 10 (ref 5–15)
BUN: 25 mg/dL — ABNORMAL HIGH (ref 8–23)
CO2: 26 mmol/L (ref 22–32)
Calcium: 8.4 mg/dL — ABNORMAL LOW (ref 8.9–10.3)
Chloride: 103 mmol/L (ref 98–111)
Creatinine, Ser: 1.49 mg/dL — ABNORMAL HIGH (ref 0.44–1.00)
GFR, Estimated: 40 mL/min — ABNORMAL LOW (ref >60)
Glucose, Bld: 91 mg/dL (ref 70–99)
Potassium: 4 mmol/L (ref 3.5–5.1)
Sodium: 139 mmol/L (ref 135–145)
Total Bilirubin: <0.2 mg/dL — ABNORMAL LOW (ref 0.3–1.2)
Total Protein: 5.9 g/dL — ABNORMAL LOW (ref 6.5–8.1)

## 2020-12-04 NOTE — Telephone Encounter (Signed)
Per MD request- contacted CT per MD regarding scheduled biopsy of abd mass scheduled 5/23- and possible need for paracentesis prior for best outcome.  Per CT they can do if needed- order added as CT aspiration- with note for paracentesis if needed.

## 2020-12-04 NOTE — Telephone Encounter (Signed)
Scheduled appointment per 05/19 los. Patient is aware. 

## 2020-12-05 ENCOUNTER — Ambulatory Visit
Admission: RE | Admit: 2020-12-05 | Discharge: 2020-12-05 | Disposition: A | Discharge status: Home / Self Care | Payer: 59 | Source: Ambulatory Visit | Attending: Radiation Oncology | Admitting: Radiation Oncology

## 2020-12-05 ENCOUNTER — Encounter: Payer: Self-pay | Admitting: Radiation Oncology

## 2020-12-05 DIAGNOSIS — Z510 Encounter for antineoplastic radiation therapy: Secondary | ICD-10-CM | POA: Diagnosis not present

## 2020-12-05 LAB — CANCER ANTIGEN 27.29: CA 27.29: 143.6 U/mL — ABNORMAL HIGH (ref 0.0–38.6)

## 2020-12-05 LAB — CA 125: Cancer Antigen (CA) 125: 904 U/mL — ABNORMAL HIGH (ref 0.0–38.1)

## 2020-12-08 ENCOUNTER — Ambulatory Visit (HOSPITAL_COMMUNITY)
Admission: RE | Admit: 2020-12-08 | Discharge: 2020-12-08 | Disposition: A | Discharge status: Home / Self Care | Payer: 59 | Source: Ambulatory Visit | Attending: Oncology | Admitting: Oncology

## 2020-12-08 ENCOUNTER — Encounter (HOSPITAL_COMMUNITY): Payer: Self-pay

## 2020-12-08 ENCOUNTER — Other Ambulatory Visit: Payer: Self-pay | Admitting: Oncology

## 2020-12-08 ENCOUNTER — Other Ambulatory Visit: Payer: Self-pay

## 2020-12-08 DIAGNOSIS — Z9012 Acquired absence of left breast and nipple: Secondary | ICD-10-CM | POA: Diagnosis not present

## 2020-12-08 DIAGNOSIS — C50812 Malignant neoplasm of overlapping sites of left female breast: Secondary | ICD-10-CM

## 2020-12-08 DIAGNOSIS — Z853 Personal history of malignant neoplasm of breast: Secondary | ICD-10-CM | POA: Insufficient documentation

## 2020-12-08 DIAGNOSIS — R180 Malignant ascites: Secondary | ICD-10-CM | POA: Diagnosis present

## 2020-12-08 DIAGNOSIS — N9489 Other specified conditions associated with female genital organs and menstrual cycle: Secondary | ICD-10-CM

## 2020-12-08 DIAGNOSIS — Z170 Estrogen receptor positive status [ER+]: Secondary | ICD-10-CM

## 2020-12-08 DIAGNOSIS — C786 Secondary malignant neoplasm of retroperitoneum and peritoneum: Secondary | ICD-10-CM | POA: Insufficient documentation

## 2020-12-08 DIAGNOSIS — R590 Localized enlarged lymph nodes: Secondary | ICD-10-CM | POA: Diagnosis not present

## 2020-12-08 HISTORY — DX: Dyspnea, unspecified: R06.00

## 2020-12-08 HISTORY — PX: IR PARACENTESIS: IMG2679

## 2020-12-08 LAB — CBC WITH DIFFERENTIAL/PLATELET
Abs Immature Granulocytes: 0.16 10*3/uL — ABNORMAL HIGH (ref 0.00–0.07)
Basophils Absolute: 0 10*3/uL (ref 0.0–0.1)
Basophils Relative: 1 %
Eosinophils Absolute: 0 10*3/uL (ref 0.0–0.5)
Eosinophils Relative: 1 %
HCT: 32.2 % — ABNORMAL LOW (ref 36.0–46.0)
Hemoglobin: 9.8 g/dL — ABNORMAL LOW (ref 12.0–15.0)
Immature Granulocytes: 2 %
Lymphocytes Relative: 20 %
Lymphs Abs: 1.5 10*3/uL (ref 0.7–4.0)
MCH: 26.9 pg (ref 26.0–34.0)
MCHC: 30.4 g/dL (ref 30.0–36.0)
MCV: 88.5 fL (ref 80.0–100.0)
Monocytes Absolute: 0.5 10*3/uL (ref 0.1–1.0)
Monocytes Relative: 7 %
Neutro Abs: 5.3 10*3/uL (ref 1.7–7.7)
Neutrophils Relative %: 69 %
Platelets: 550 10*3/uL — ABNORMAL HIGH (ref 150–400)
RBC: 3.64 MIL/uL — ABNORMAL LOW (ref 3.87–5.11)
RDW: 16.9 % — ABNORMAL HIGH (ref 11.5–15.5)
WBC: 7.5 10*3/uL (ref 4.0–10.5)
nRBC: 0 % (ref 0.0–0.2)

## 2020-12-08 LAB — BASIC METABOLIC PANEL
Anion gap: 7 (ref 5–15)
BUN: 26 mg/dL — ABNORMAL HIGH (ref 8–23)
CO2: 25 mmol/L (ref 22–32)
Calcium: 8.4 mg/dL — ABNORMAL LOW (ref 8.9–10.3)
Chloride: 104 mmol/L (ref 98–111)
Creatinine, Ser: 1.52 mg/dL — ABNORMAL HIGH (ref 0.44–1.00)
GFR, Estimated: 39 mL/min — ABNORMAL LOW (ref >60)
Glucose, Bld: 96 mg/dL (ref 70–99)
Potassium: 3.5 mmol/L (ref 3.5–5.1)
Sodium: 136 mmol/L (ref 135–145)

## 2020-12-08 LAB — PROTIME-INR
INR: 1 (ref 0.8–1.2)
Prothrombin Time: 13.2 seconds (ref 11.4–15.2)

## 2020-12-08 MED ORDER — MIDAZOLAM HCL 2 MG/2ML IJ SOLN
INTRAMUSCULAR | Status: AC | PRN
  Administered 2020-12-08 (×2): 1 mg via INTRAVENOUS

## 2020-12-08 MED ORDER — LIDOCAINE HCL 1 % IJ SOLN
INTRAMUSCULAR | Status: AC | PRN
  Administered 2020-12-08: 10 mL via INTRADERMAL

## 2020-12-08 MED ORDER — FENTANYL CITRATE (PF) 100 MCG/2ML IJ SOLN
INTRAMUSCULAR | Status: AC | PRN
  Administered 2020-12-08 (×2): 50 ug via INTRAVENOUS

## 2020-12-08 MED ORDER — LIDOCAINE HCL (PF) 1 % IJ SOLN
INTRAMUSCULAR | Status: AC | PRN
  Administered 2020-12-08: 5 mL

## 2020-12-08 MED ORDER — SODIUM CHLORIDE 0.9 % IV SOLN
INTRAVENOUS | Status: DC
Start: 2020-12-08 — End: 2020-12-09

## 2020-12-08 MED ORDER — MIDAZOLAM HCL 2 MG/2ML IJ SOLN
INTRAMUSCULAR | Status: AC
  Filled 2020-12-08: qty 4

## 2020-12-08 MED ORDER — LIDOCAINE HCL 1 % IJ SOLN
INTRAMUSCULAR | Status: AC
  Filled 2020-12-08: qty 20

## 2020-12-08 MED ORDER — FENTANYL CITRATE (PF) 100 MCG/2ML IJ SOLN
INTRAMUSCULAR | Status: AC
  Filled 2020-12-08: qty 2

## 2020-12-08 NOTE — H&P (Signed)
Referring Physician(s): Chauncey Cruel  Supervising Physician: Daryll Brod  Patient Status:  [facility]  Chief Complaint:  "I'm having a biopsy"   Subjective: Patient familiar to IR service from paracentesis on 11/21/2020. She has a history of left breast carcinoma, status post mastectomy in 2010.  She now has metastatic disease with a left periorbital mass.  Recent imaging has also revealed:  1. Bulky mediastinal lymphadenopathy consistent with metastatic disease. There is mild right hilar lymphadenopathy. 2. Tiny bilateral pulmonary nodules, indeterminate. Attention on follow-up recommended. 3. Markedly large volume ascites with bandlike areas of linear nodularity in the omentum bilaterally, concerning for metastatic involvement. There is lymphadenopathy in the hepatoduodenal ligament and ileocolic mesentery. Probable peritoneal nodule in the right cul-de-sac. 4. 5 cm soft tissue lesion in the right adnexal space may be related to ovarian primary or metastatic involvement. 5. Suggestion of diffuse wall thickening in the stomach with associated underdistention making the finding somewhat indeterminate. Upper endoscopy may prove helpful to further evaluate. 6. There is also suggestion of wall thickening in the cecum although colon is not well distended. Correlation with colorectal cancer screening history recommended.  She presents today for image guided omental versus right adnexal mass biopsy as well as possible paracentesis.  She currently denies fever, headache, chest pain, dyspnea, cough, back pain, nausea, vomiting or bleeding.  She does have some abdominal bloating and visual difficulty in left eye.  Past Medical History:  Diagnosis Date  . Breast cancer ([place])   . Cancer ([place])   . Dyspnea   . Hypertension   . Vertigo    Past Surgical History:  Procedure Laterality Date  . CARPAL TUNNEL RELEASE    . MASTECTOMY        Allergies: Clindamycin/lincomycin  Medications: Prior to Admission medications   Medication Sig Start Date End Date Taking? Authorizing Provider  acetaminophen (TYLENOL) 500 MG tablet Take 500 mg by mouth every 6 (six) hours as needed for moderate pain.    [provider]  albuterol (VENTOLIN HFA) 108 (90 Base) MCG/ACT inhaler Inhale 2 puffs into the lungs every 6 (six) hours as needed for wheezing or shortness of breath.    [provider]  anastrozole (ARIMIDEX) 1 MG tablet Take 1 tablet (1 mg total) by mouth daily. 11/22/20   Little Ishikawa, MD  atorvastatin (LIPITOR) 20 MG tablet Take 20 mg by mouth daily. 11/16/20   [provider]  cholecalciferol (VITAMIN D3) 25 MCG (1000 UNIT) tablet Take 1,000 Units by mouth daily.    [provider]  famotidine (PEPCID) 20 MG tablet Take 20 mg by mouth daily. 11/17/20   [provider]  HYDROcodone-acetaminophen (NORCO/VICODIN) 5-325 MG tablet Take 1-2 tablets by mouth every 4 (four) hours as needed for severe pain or moderate pain. 11/21/20   Little Ishikawa, MD  Loratadine 10 MG CAPS Take 1 capsule by mouth daily as needed.    [provider]  pantoprazole (PROTONIX) 20 MG tablet Take 20 mg by mouth daily.    [provider]  PARoxetine (PAXIL) 40 MG tablet Take 40 mg by mouth every morning.    [provider]     Vital Signs:pending    Physical Exam: Awake, alert.  Chest clear to auscultation bilaterally.  Heart with regular rate and rhythm.  Abdomen soft, distended, positive bowel sounds, currently nontender.  Trace pretibial edema bilaterally.  Imaging: No results found.  Labs:  CBC: Recent Labs    11/20/20 0353 11/21/20  4431 12/04/20 1010 12/08/20 0724  WBC 8.2 8.9 7.8 7.5  HGB 10.0* 11.4* 9.4* 9.8*  HCT 32.3* 37.0 29.6* 32.2*  PLT 461* 497* 494* 550*    COAGS: Recent Labs    12/08/20 0724  INR 1.0    BMP: Recent Labs     11/20/20 0353 11/21/20 0317 12/04/20 1010 12/08/20 0724  NA 138 136 139 136  K 4.1 4.1 4.0 3.5  CL 105 103 103 104  CO2 24 23 26 25   GLUCOSE 90 92 91 96  BUN 20 19 25* 26*  CALCIUM 8.7* 8.8* 8.4* 8.4*  CREATININE 1.37* 1.31* 1.49* 1.52*  GFRNONAA 44* 46* 40* 39*    LIVER FUNCTION TESTS: Recent Labs    11/19/20 1950 11/21/20 0317 12/04/20 1010  BILITOT 0.3 0.1* <0.2*  AST 41 51* 24  ALT 10 18 8   ALKPHOS 183* 267* 226*  PROT 6.6 6.2* 5.9*  ALBUMIN 2.7* 3.2* 2.1*    Assessment and Plan: Patient familiar to IR service from paracentesis on 11/21/2020. She has a history of left breast carcinoma, status post mastectomy in 2010.  She now has metastatic disease with a left periorbital mass.  Recent imaging has also revealed:  1. Bulky mediastinal lymphadenopathy consistent with metastatic disease. There is mild right hilar lymphadenopathy. 2. Tiny bilateral pulmonary nodules, indeterminate. Attention on follow-up recommended. 3. Markedly large volume ascites with bandlike areas of linear nodularity in the omentum bilaterally, concerning for metastatic involvement. There is lymphadenopathy in the hepatoduodenal ligament and ileocolic mesentery. Probable peritoneal nodule in the right cul-de-sac. 4. 5 cm soft tissue lesion in the right adnexal space may be related to ovarian primary or metastatic involvement. 5. Suggestion of diffuse wall thickening in the stomach with associated underdistention making the finding somewhat indeterminate. Upper endoscopy may prove helpful to further evaluate. 6. There is also suggestion of wall thickening in the cecum although colon is not well distended. Correlation with colorectal cancer screening history recommended.  She presents today for image guided omental versus right adnexal mass biopsy as well as possible paracentesis.Risks and benefits of procedure was discussed with the patient  including, but not limited to bleeding, infection,  damage to adjacent structures or low yield requiring additional tests.  All of the questions were answered and there is agreement to proceed.  Consent signed and in chart.     Electronically Signed: D. Rowe Robert, PA-C 12/08/2020, 8:22 AM   I spent a total of 25 minutes at the the patient's bedside AND on the patient's hospital floor or unit, greater than 50% of which was counseling/coordinating care for image guided biopsy of omental versus right adnexal mass and paracentesis

## 2020-12-08 NOTE — Discharge Instructions (Signed)
Needle Biopsy, Care After These instructions tell you how to care for yourself after your procedure. Your doctor may also give you more specific instructions. Call your doctor if you have any problems or questions. What can I expect after the procedure? After the procedure, it is common to have:  Soreness.  Bruising.  Mild pain. Follow these instructions at home:  Return to your normal activities as told by your doctor. Ask your doctor what activities are safe for you.  Take over-the-counter and prescription medicines only as told by your doctor.  Wash your hands with soap and water before you change your bandage (dressing). If you cannot use soap and water, use hand sanitizer.  Follow instructions from your doctor about: ? How to take care of your puncture site. ? When and how to change your bandage. ? When to remove your bandage.  Check your puncture site every day for signs of infection. Watch for: ? Redness, swelling, or pain. ? Fluid or blood. ? Pus or a bad smell. ? Warmth.  Do not take baths, swim, or use a hot tub until your doctor approves. Ask your doctor if you may take showers. You may only be allowed to take sponge baths.  Keep all follow-up visits as told by your doctor. This is important.   Contact a doctor if you have:  A fever.  Redness, swelling, or pain at the puncture site, and it lasts longer than a few days.  Fluid, blood, or pus coming from the puncture site.  Warmth coming from the puncture site. Get help right away if:  You have a lot of bleeding from the puncture site. Summary  After the procedure, it is common to have soreness, bruising, or mild pain at the puncture site.  Check your puncture site every day for signs of infection, such as redness, swelling, or pain.  Get help right away if you have severe bleeding from your puncture site. This information is not intended to replace advice given to you by your health care provider. Make  sure you discuss any questions you have with your health care provider. Document Revised: 01/03/2020 Document Reviewed: 01/03/2020 Elsevier Patient Education  2021 [employer]. Moderate Conscious Sedation, Adult, Care After This sheet gives you information about how to care for yourself after your procedure. Your health care provider may also give you more specific instructions. If you have problems or questions, contact your health care provider. What can I expect after the procedure? After the procedure, it is common to have:  Sleepiness for several hours.  Impaired judgment for several hours.  Difficulty with balance.  Vomiting if you eat too soon. Follow these instructions at home: For the time period you were told by your health care provider:  Rest.  Do not participate in activities where you could fall or become injured.  Do not drive or use machinery.  Do not drink alcohol.  Do not take sleeping pills or medicines that cause drowsiness.  Do not make important decisions or sign legal documents.  Do not take care of children on your own.      Eating and drinking  Follow the diet recommended by your health care provider.  Drink enough fluid to keep your urine pale yellow.  If you vomit: ? Drink water, juice, or soup when you can drink without vomiting. ? Make sure you have little or no nausea before eating solid foods.   General instructions  Take over-the-counter and prescription medicines only as   told by your health care provider.  Have a responsible adult stay with you for the time you are told. It is important to have someone help care for you until you are awake and alert.  Do not smoke.  Keep all follow-up visits as told by your health care provider. This is important. Contact a health care provider if:  You are still sleepy or having trouble with balance after 24 hours.  You feel light-headed.  You keep feeling nauseous or you keep  vomiting.  You develop a rash.  You have a fever.  You have redness or swelling around the IV site. Get help right away if:  You have trouble breathing.  You have new-onset confusion at home. Summary  After the procedure, it is common to feel sleepy, have impaired judgment, or feel nauseous if you eat too soon.  Rest after you get home. Know the things you should not do after the procedure.  Follow the diet recommended by your health care provider and drink enough fluid to keep your urine pale yellow.  Get help right away if you have trouble breathing or new-onset confusion at home. This information is not intended to replace advice given to you by your health care provider. Make sure you discuss any questions you have with your health care provider. Document Revised: 11/02/2019 Document Reviewed: 05/31/2019 Elsevier Patient Education  2021 [place]. Needle Biopsy, Care After These instructions tell you how to care for yourself after your procedure. Your doctor may also give you more specific instructions. Call your doctor if you have any problems or questions. What can I expect after the procedure? After the procedure, it is common to have:  Soreness.  Bruising.  Mild pain. Follow these instructions at home:  Return to your normal activities as told by your doctor. Ask your doctor what activities are safe for you.  Take over-the-counter and prescription medicines only as told by your doctor.  Wash your hands with soap and water before you change your bandage (dressing). If you cannot use soap and water, use hand sanitizer.  Follow instructions from your doctor about: ? How to take care of your puncture site. ? When and how to change your bandage. ? When to remove your bandage.  Check your puncture site every day for signs of infection. Watch for: ? Redness, swelling, or pain. ? Fluid or blood. ? Pus or a bad smell. ? Warmth.  Do not take baths, swim, or use  a hot tub until your doctor approves. Ask your doctor if you may take showers. You may only be allowed to take sponge baths.  Keep all follow-up visits as told by your doctor. This is important.   Contact a doctor if you have:  A fever.  Redness, swelling, or pain at the puncture site, and it lasts longer than a few days.  Fluid, blood, or pus coming from the puncture site.  Warmth coming from the puncture site. Get help right away if:  You have a lot of bleeding from the puncture site. Summary  After the procedure, it is common to have soreness, bruising, or mild pain at the puncture site.  Check your puncture site every day for signs of infection, such as redness, swelling, or pain.  Get help right away if you have severe bleeding from your puncture site. This information is not intended to replace advice given to you by your health care provider. Make sure you discuss any questions you have with  your health care provider. Document Revised: 01/03/2020 Document Reviewed: 01/03/2020 Elsevier Patient Education  2021 [place]. Paracentesis, Care After This sheet gives you information about how to care for yourself after your procedure. Your health care provider may also give you more specific instructions. If you have problems or questions, contact your health care provider. What can I expect after the procedure? After the procedure, it is common to have a small amount of clear fluid coming from the puncture site. Follow these instructions at home: Puncture site care  Follow instructions from your health care provider about how to take care of your puncture site. Make sure you: ? Wash your hands with soap and water before and after you change your bandage (dressing). If soap and water are not available, use hand sanitizer. ? Change your dressing as told by your health care provider.  Check your puncture area every day for signs of infection. Check for: ? Redness, swelling, or  pain. ? More fluid or blood. ? Warmth. ? Pus or a bad smell.   General instructions  Return to your normal activities as told by your health care provider. Ask your health care provider what activities are safe for you.  Take over-the-counter and prescription medicines only as told by your health care provider.  Do not take baths, swim, or use a hot tub until your health care provider approves. Ask your health care provider if you may take showers. You may only be allowed to take sponge baths.  Keep all follow-up visits as told by your health care provider. This is important. Contact a health care provider if:  You have redness, swelling, or pain at your puncture site.  You have more fluid or blood coming from your puncture site.  Your puncture site feels warm to the touch.  You have pus or a bad smell coming from your puncture site.  You have a fever. Get help right away if:  You have chest pain or shortness of breath.  You develop increasing pain, discomfort, or swelling in your abdomen.  You feel dizzy or light-headed or you faint. Summary  After the procedure, it is common to have a small amount of clear fluid coming from the puncture site.  Follow instructions from your health care provider about how to take care of your puncture site.  Check your puncture area every day signs of infection.  Keep all follow-up visits as told by your health care provider. This information is not intended to replace advice given to you by your health care provider. Make sure you discuss any questions you have with your health care provider. Document Revised: 01/16/2019 Document Reviewed: 04/25/2018 Elsevier Patient Education  2021 [employer].

## 2020-12-08 NOTE — Procedures (Signed)
Interventional Radiology Procedure Note  Procedure: CT ANTERIOR OMENTAL BX    Complications: None  Estimated Blood Loss:  MIN  Findings: 18 G CORES X 3    M. Daryll Brod, MD

## 2020-12-08 NOTE — Procedures (Signed)
Ultrasound-guided  therapeutic paracentesis performed yielding 6.9 liters of yellow  fluid. No immediate complications. EBL none.   

## 2020-12-10 ENCOUNTER — Other Ambulatory Visit: Payer: Self-pay | Admitting: Oncology

## 2020-12-10 DIAGNOSIS — Z170 Estrogen receptor positive status [ER+]: Secondary | ICD-10-CM

## 2020-12-10 DIAGNOSIS — C50919 Malignant neoplasm of unspecified site of unspecified female breast: Secondary | ICD-10-CM

## 2020-12-10 DIAGNOSIS — C50812 Malignant neoplasm of overlapping sites of left female breast: Secondary | ICD-10-CM

## 2020-12-10 DIAGNOSIS — C787 Secondary malignant neoplasm of liver and intrahepatic bile duct: Secondary | ICD-10-CM

## 2020-12-10 MED ORDER — ABEMACICLIB 150 MG PO TABS
150.0000 mg | ORAL_TABLET | Freq: Two times a day (BID) | ORAL | 6 refills | Status: DC
  Filled 2020-12-10: qty 70, 35d supply, fill #0

## 2020-12-10 NOTE — Progress Notes (Signed)
I called mother and let her know that we are reading her pathology as breast not ovarian.  We are going to add Verzenio to her antiestrogens.  The order has been placed.  She will see me next week.

## 2020-12-11 ENCOUNTER — Telehealth: Payer: Self-pay

## 2020-12-11 ENCOUNTER — Telehealth: Payer: Self-pay | Admitting: Pharmacist

## 2020-12-11 ENCOUNTER — Other Ambulatory Visit (HOSPITAL_COMMUNITY): Payer: Self-pay

## 2020-12-11 DIAGNOSIS — Z170 Estrogen receptor positive status [ER+]: Secondary | ICD-10-CM

## 2020-12-11 DIAGNOSIS — C50812 Malignant neoplasm of overlapping sites of left female breast: Secondary | ICD-10-CM

## 2020-12-11 MED ORDER — ABEMACICLIB 150 MG PO TABS: 150.0000 mg | ORAL_TABLET | Freq: Two times a day (BID) | ORAL | 6 refills | Status: DC

## 2020-12-11 NOTE — Telephone Encounter (Signed)
Oral Oncology Patient Advocate Encounter  Received notification from [facility] that prior authorization for Verzenio is required.  PA submitted on CoverMyMeds Key [id number] Status is pending  Oral Oncology Clinic will continue to follow.   [facility] Phone [phone] Fax [phone] 12/11/2020 8:32 AM

## 2020-12-11 NOTE — Telephone Encounter (Signed)
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  Prescription dose and frequency assessed for appropriateness. Appropriate for therapy initiation.   CBC w/ Diff from 12/08/20 and CMP from 12/04/20 assessed, noted Scr 1.49 mg/dL (CrCl ~41 mL/min), Alk phos elevated at 226 U/L - no renal or hepatic dose adjustments required at this time.  Current medication list in Epic reviewed, no relevant/significant DDIs with Verzenio identified.  Evaluated chart and no patient barriers to medication adherence noted.   Prescription has been e-scribed to the [facility] for benefits analysis and approval.  Oral Oncology Clinic will continue to follow for insurance authorization, copayment issues, initial counseling and start date.  Leron Croak, PharmD, BCPS Hematology/Oncology Clinical Pharmacist [facility] [phone] 12/11/2020 9:50 AM

## 2020-12-11 NOTE — Telephone Encounter (Addendum)
Oral Oncology Pharmacist Encounter  Patient's insurance does not allow Verzenio to be filled through [facility]. Prescription has been redirected to [facility] for dispensing.  Addendum 12/12/2020: received fax 12/12/20 AM from [facility] pharmacy that patient's prescription has been transferred to [facility] due to this being the preferred specialty pharmacy for patient's insurance plan.   Leron Croak, PharmD, BCPS Hematology/Oncology Clinical Pharmacist [facility] [phone] 12/11/2020 2:02 PM

## 2020-12-16 ENCOUNTER — Inpatient Hospital Stay (HOSPITAL_BASED_OUTPATIENT_CLINIC_OR_DEPARTMENT_OTHER): Payer: 59 | Admitting: Oncology

## 2020-12-16 ENCOUNTER — Inpatient Hospital Stay: Payer: 59

## 2020-12-16 ENCOUNTER — Other Ambulatory Visit: Payer: Self-pay

## 2020-12-16 VITALS — BP 124/60 | HR 115 | Temp 97.7°F | Resp 18 | Ht 62.0 in | Wt 139.9 lb

## 2020-12-16 DIAGNOSIS — C6962 Malignant neoplasm of left orbit: Secondary | ICD-10-CM | POA: Diagnosis not present

## 2020-12-16 DIAGNOSIS — C787 Secondary malignant neoplasm of liver and intrahepatic bile duct: Secondary | ICD-10-CM | POA: Diagnosis not present

## 2020-12-16 DIAGNOSIS — Z170 Estrogen receptor positive status [ER+]: Secondary | ICD-10-CM

## 2020-12-16 DIAGNOSIS — C50919 Malignant neoplasm of unspecified site of unspecified female breast: Secondary | ICD-10-CM | POA: Diagnosis not present

## 2020-12-16 DIAGNOSIS — R180 Malignant ascites: Secondary | ICD-10-CM

## 2020-12-16 DIAGNOSIS — Z510 Encounter for antineoplastic radiation therapy: Secondary | ICD-10-CM | POA: Diagnosis not present

## 2020-12-16 DIAGNOSIS — C50812 Malignant neoplasm of overlapping sites of left female breast: Secondary | ICD-10-CM

## 2020-12-16 DIAGNOSIS — E538 Deficiency of other specified B group vitamins: Secondary | ICD-10-CM

## 2020-12-16 DIAGNOSIS — N9489 Other specified conditions associated with female genital organs and menstrual cycle: Secondary | ICD-10-CM

## 2020-12-16 LAB — CBC WITH DIFFERENTIAL/PLATELET
Abs Immature Granulocytes: 0.3 10*3/uL — ABNORMAL HIGH (ref 0.00–0.07)
Basophils Absolute: 0 10*3/uL (ref 0.0–0.1)
Basophils Relative: 1 %
Eosinophils Absolute: 0 10*3/uL (ref 0.0–0.5)
Eosinophils Relative: 0 %
HCT: 29.8 % — ABNORMAL LOW (ref 36.0–46.0)
Hemoglobin: 9.1 g/dL — ABNORMAL LOW (ref 12.0–15.0)
Immature Granulocytes: 4 %
Lymphocytes Relative: 17 %
Lymphs Abs: 1.4 10*3/uL (ref 0.7–4.0)
MCH: 26.6 pg (ref 26.0–34.0)
MCHC: 30.5 g/dL (ref 30.0–36.0)
MCV: 87.1 fL (ref 80.0–100.0)
Monocytes Absolute: 0.6 10*3/uL (ref 0.1–1.0)
Monocytes Relative: 7 %
Neutro Abs: 6.2 10*3/uL (ref 1.7–7.7)
Neutrophils Relative %: 71 %
Platelets: 460 10*3/uL — ABNORMAL HIGH (ref 150–400)
RBC: 3.42 MIL/uL — ABNORMAL LOW (ref 3.87–5.11)
RDW: 17.2 % — ABNORMAL HIGH (ref 11.5–15.5)
WBC: 8.7 10*3/uL (ref 4.0–10.5)
nRBC: 0 % (ref 0.0–0.2)

## 2020-12-16 LAB — COMPREHENSIVE METABOLIC PANEL
ALT: 10 U/L (ref 0–44)
AST: 28 U/L (ref 15–41)
Albumin: 1.6 g/dL — ABNORMAL LOW (ref 3.5–5.0)
Alkaline Phosphatase: 219 U/L — ABNORMAL HIGH (ref 38–126)
Anion gap: 10 (ref 5–15)
BUN: 17 mg/dL (ref 8–23)
CO2: 23 mmol/L (ref 22–32)
Calcium: 8.3 mg/dL — ABNORMAL LOW (ref 8.9–10.3)
Chloride: 105 mmol/L (ref 98–111)
Creatinine, Ser: 1.25 mg/dL — ABNORMAL HIGH (ref 0.44–1.00)
GFR, Estimated: 49 mL/min — ABNORMAL LOW (ref >60)
Glucose, Bld: 109 mg/dL — ABNORMAL HIGH (ref 70–99)
Potassium: 4.5 mmol/L (ref 3.5–5.1)
Sodium: 138 mmol/L (ref 135–145)
Total Bilirubin: <0.2 mg/dL — ABNORMAL LOW (ref 0.3–1.2)
Total Protein: 5.6 g/dL — ABNORMAL LOW (ref 6.5–8.1)

## 2020-12-16 NOTE — Telephone Encounter (Signed)
Oral Chemotherapy Pharmacist Encounter   Spoke with patient today to follow up regarding patient's oral chemotherapy medication: Verzenio (abemaciclib)  Updated patient this AM that medication is required to be filled through [employer]. [employer] representative stated that patient should be receiving a phone call within the next 24 hours to set up medication shipment for Verzenio. Provided patient with [employer]'s phone number [phone]) if she does not receive a phone call by tomorrow AM regarding medication shipment. Patient also provided with Oral Chemotherapy Clinic phone number if cost is unaffordable for medication.   Will follow-up with patient 12/17/2020 to ensure patient has no issues with medication acquisition from [facility]. Patient knows to call the office with questions or concerns.  Leron Croak, PharmD, BCPS Hematology/Oncology Clinical Pharmacist [facility] [phone] 12/16/2020 9:17 AM

## 2020-12-16 NOTE — Progress Notes (Signed)
ID: Carolyn Barron   DOB: [birth year]  MR#: [id number]  AJO#:[id number]  Patient Care Team: [name], DO as PCP - General (Family Medicine) Consuella Lose, MD as Consulting Physician (Neurosurgery) Eppie Gibson, MD as Consulting Physician (Radiation Oncology) Magrinat, Virgie Dad, MD as Consulting Physician (Oncology) OTHER MD:  CHIEF COMPLAINT: now metastatic cancer (s/p remote left mastectomy)  CURRENT TREATMENT:  anastrozole; palliative radiation   INTERVAL HISTORY: Carolyn Barron returns today for re-evaluation of her now metastatic breast cancer.   Since her last visit, she underwent biopsy of the abdominal mass on 12/08/2020. Pathology from the procedure [id number]) revealed: metastatic carcinoma, with morphology and immunophenotype consistent with metastatic mammary carcinoma  The patient completed palliative radiation treatment to the left periorbital mass under Dr. Isidore Moos on 12/05/2020.  She tolerated this generally well.  She was started on anastrozole on 11/20/2020. She is having no side effects from this that she is aware of other than some hot flashes and feeling "warm" in the house.  She tells me she is now keeping the air conditioning at 68.  Her tumor markers suggests an early response to anastrozole Results for CALAH, GERSHMAN (MRN [id number]) as of 12/16/2020 17:09  Ref. Range 03/19/2010 13:38 11/21/2020 03:17 12/04/2020 10:10  CA 27.29 Latest Ref Range: 0.0 - 38.6 U/mL  151.8 (H) 143.6 (H)  Cancer Antigen (CA) 125 Latest Ref Range: 0.0 - 38.1 U/mL  1,108.0 (H) 904.0 (H)    REVIEW OF SYSTEMS: Ramsey tells me this past weekend was "rough".  She does not feel like eating.  She has a normal sense of taste.  She is not nauseated although she has a queasy stomach and she has not vomited.  She continues to lose weight.  She denies diarrhea although she has several firm bowel movements daily she says.  She denies unusual headaches visual changes dizziness or gait imbalance.  There have been  no intercurrent falls.  A detailed review of systems was otherwise stable   COVID 19 VACCINATION STATUS: Status post vaccine x2   BREAST CANCER HISTORY: From the original intake note:  Carolyn Barron felt a lump in her left breast after a shower in late October. She brought it to Dr. Eugenio Hoes attention, and was set up for mammography November 1 at the [facility].  Dr. Sadie Haber was able to feel some thickening at 1 o'clock in the left breast about 5 cm from the left nipple, and by mammography there was a spiculated mass there corresponding to the palpable finding.  Ultrasound showed this to be irregular, and to measure approximately 1.6 cm.  The left axilla appeared normal.  Biopsy was performed the same day, and showed ([id number]) an invasive mammary carcinoma with lobular features, which was ER+ at 100%, PR+ at 99%, with a low proliferation marker at 10%, and HER2 negative with a ratio of 1.13.   With this information, the patient was referred to Dr. Brantley Stage and breast MRI was obtained November 3.  This showed a large area of non-mass enhancement within the upper-outer and upper-inner quadrants of the left breast, much larger than the abnormality found by physical examination, ultrasound or mammography. The question was then raised whether to proceed to biopsy of the edges of this mass to ascertain for respectability, or to proceed directly to mastectomy, and the patient much preferred the latter, so mastectomy was performed November 12 with sentinel lymph node biopsy, the final pathology showing ([id number]) a 1.8 cm invasive lobular carcinoma, grade 2, with no  evidence of lymphovascular invasion and ample margins.  The sentinel lymph node was negative.   Her subsequent history is as detailed below.   PAST MEDICAL HISTORY: Past Medical History:  Diagnosis Date  . Breast cancer ([place])   . Cancer ([place])   . Dyspnea   . Hypertension   . Vertigo   The past medical history is significant for  hyperlipidemia, remote history of kidney stones, history of asthma, history of osteopenia, history of carpal tunnel repair, history of trigger finger repair (both these two by Dr. Daylene Katayama), and history of continuing tobacco abuse.     PAST SURGICAL HISTORY: Past Surgical History:  Procedure Laterality Date  . CARPAL TUNNEL RELEASE    . IR PARACENTESIS  12/08/2020  . MASTECTOMY      FAMILY HISTORY Family History  Problem Relation Age of Onset  . Lung cancer Father        smoker  . Lung cancer Paternal Grandmother   The patient's father died from lung cancer.  He was [facility] at [facility].  He was a smoker.  The patient's mother is in fair health (age 49 as of March 2016).  The patient has one sister. The only cancer that she knows of in the family is the patient's father's mother's, who had lung cancer. (The patient is a distant cousin of Marja Kays, who of course died from breast cancer.)    GYNECOLOGIC HISTORY: She is GX P1, first pregnancy to term at age 3.  Last menstrual period was 2005.  She never took hormone replacement.    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    Her husband, Rush Landmark, died suddenly May 02, 2013.  The patient's daughter, [name], is currently living in [place].  Her husband is in the [employer].  They have 4 children.  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      ADVANCED DIRECTIVES: Not in place   HEALTH MAINTENANCE: Social History   Tobacco Use  . Smoking status: Current Some Day Smoker    Packs/day: 0.50    Types: Cigarettes  . Smokeless tobacco: Never Used  Vaping Use  . Vaping Use: Never used  Substance Use Topics  . Alcohol use: No  . Drug use: No     Colonoscopy:  PAP:  Bone density:  Lipid panel:  Allergies  Allergen Reactions  . Clindamycin/Lincomycin Rash    Current Outpatient  Medications  Medication Sig Dispense Refill  . abemaciclib (VERZENIO) 150 MG tablet Take 1 tablet (150 mg total) by mouth 2 (two) times daily. Swallow tablets whole. Do not chew, crush, or split tablets before swallowing. Start 12/16/2020 56 tablet 6  . acetaminophen (TYLENOL) 500 MG tablet Take 500 mg by mouth every 6 (six) hours as needed for moderate pain.    Marland Kitchen albuterol (VENTOLIN HFA) 108 (90 Base) MCG/ACT inhaler Inhale 2 puffs into the lungs every 6 (six) hours as needed for wheezing or shortness of breath.    . anastrozole (ARIMIDEX) 1 MG tablet Take 1 tablet (1 mg total) by mouth daily. 30 tablet 0  . atorvastatin (LIPITOR) 20 MG tablet Take 20 mg by mouth daily.    . cholecalciferol (VITAMIN D3) 25 MCG (1000 UNIT) tablet Take 1,000 Units by mouth daily.    . famotidine (PEPCID) 20 MG tablet Take 20 mg by mouth daily.    Marland Kitchen  HYDROcodone-acetaminophen (NORCO/VICODIN) 5-325 MG tablet Take 1-2 tablets by mouth every 4 (four) hours as needed for severe pain or moderate pain. 30 tablet 0  . Loratadine 10 MG CAPS Take 1 capsule by mouth daily as needed.    . pantoprazole (PROTONIX) 20 MG tablet Take 20 mg by mouth daily.    Marland Kitchen PARoxetine (PAXIL) 40 MG tablet Take 40 mg by mouth every morning.     No current facility-administered medications for this visit.    OBJECTIVE: White woman in no acute distress Vitals:   12/16/20 1336  BP: 124/60  Pulse: (!) 115  Resp: 18  Temp: 97.7 F (36.5 C)  SpO2: 98%     Body mass index is 25.59 kg/m.    ECOG FS: 1  Sclerae unicteric, EOMs intact Wearing a mask Lungs no rales or rhonchi Heart regular rate and rhythm Abd moderately distended, nontender, positive bowel sounds MSK no focal spinal tenderness Neuro: nonfocal, well oriented, appropriate affect Breasts: Deferred   LAB RESULTS: Lab Results  Component Value Date   WBC 8.7 12/16/2020   NEUTROABS 6.2 12/16/2020   HGB 9.1 (L) 12/16/2020   HCT 29.8 (L) 12/16/2020   MCV 87.1 12/16/2020    PLT 460 (H) 12/16/2020      Chemistry      Component Value Date/Time   NA 138 12/16/2020 1432   NA 142 09/25/2014 1532   K 4.5 12/16/2020 1432   K 4.2 09/25/2014 1532   CL 105 12/16/2020 1432   CO2 23 12/16/2020 1432   CO2 27 09/25/2014 1532   BUN 17 12/16/2020 1432   BUN 14.1 09/25/2014 1532   CREATININE 1.25 (H) 12/16/2020 1432   CREATININE 0.9 09/25/2014 1532      Component Value Date/Time   CALCIUM 8.3 (L) 12/16/2020 1432   CALCIUM 9.4 09/25/2014 1532   ALKPHOS 219 (H) 12/16/2020 1432   ALKPHOS 106 09/25/2014 1532   AST 28 12/16/2020 1432   AST 11 09/25/2014 1532   ALT 10 12/16/2020 1432   ALT 9 09/25/2014 1532   BILITOT <0.2 (L) 12/16/2020 1432   BILITOT <0.20 09/25/2014 1532       Lab Results  Component Value Date   LABCA2 14 03/19/2010    No components found for: [id number]  No results for input(s): INR in the last 168 hours.  Urinalysis    Component Value Date/Time   COLORURINE YELLOW 04/16/2012 1637   APPEARANCEUR CLOUDY (A) 04/16/2012 1637   LABSPEC 1.022 04/16/2012 1637   PHURINE 6.0 04/16/2012 1637   GLUCOSEU NEGATIVE 04/16/2012 1637   HGBUR NEGATIVE 04/16/2012 1637   BILIRUBINUR NEGATIVE 04/16/2012 1637   KETONESUR NEGATIVE 04/16/2012 1637   PROTEINUR NEGATIVE 04/16/2012 1637   UROBILINOGEN 0.2 04/16/2012 1637   NITRITE NEGATIVE 04/16/2012 1637   LEUKOCYTESUR NEGATIVE 04/16/2012 1637    STUDIES: [place] CHEST (PLEURAL EFFUSION)  Result Date: 11/21/2020 CLINICAL DATA:  Left pleural effusion. EXAM: CHEST ULTRASOUND COMPARISON:  CT of the chest on 11/14/2020 FINDINGS: Imaging in a sitting upright position was performed demonstrating a small left pleural effusion. Fluid volume was not felt large enough to warrant attempt at thoracentesis today. IMPRESSION: Small volume left pleural effusion. There was not enough fluid present for attempt at thoracentesis today. Electronically Signed   By: Aletta Edouard M.D.   On: 11/21/2020 15:57   US  Paracentesis  Result Date: 11/21/2020 INDICATION: Abdominal distension. Request for therapeutic and diagnostic paracentesis. EXAM: ULTRASOUND GUIDED  PARACENTESIS MEDICATIONS: 10 mL 1% lidocaine COMPLICATIONS: None  immediate. PROCEDURE: Informed written consent was obtained from the patient after a discussion of the risks, benefits and alternatives to treatment. A timeout was performed prior to the initiation of the procedure. Initial ultrasound scanning demonstrates a large amount of ascites within the left lower abdominal quadrant. The left lower abdomen was prepped and draped in the usual sterile fashion. 1% lidocaine was used for local anesthesia. Following this, a 19 gauge, 7-cm, Yueh catheter was introduced. An ultrasound image was saved for documentation purposes. The paracentesis was performed. The catheter was removed and a dressing was applied. The patient tolerated the procedure well without immediate post procedural complication. Patient received post-procedure intravenous albumin; see nursing notes for details. FINDINGS: A total of approximately 9 L of clear pale yellow fluid was removed. Samples were sent to the laboratory as requested by the clinical team. IMPRESSION: Successful ultrasound-guided paracentesis yielding 9 liters of peritoneal fluid. Read by: Durenda Guthrie, PA-C Electronically Signed   By: Lucrezia Europe M.D.   On: 11/20/2020 11:35   CT ABDOMINAL MASS BIOPSY  Result Date: 12/08/2020 INDICATION: History of breast cancer and concern for ovarian cancer. 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 A total of Versed 2.0 mg and Fentanyl 100 mcg was administered intravenously. Moderate Sedation Time: 11 minutes. The patient's level of consciousness and vital signs were monitored continuously by radiology nursing throughout the procedure under my  direct supervision. FLUOROSCOPY TIME:  Fluoroscopy Time: None. COMPLICATIONS: None immediate. PROCEDURE: Informed written consent was obtained from the patient after a thorough discussion of the procedural risks, benefits and alternatives. All questions were addressed. Maximal Sterile Barrier Technique was utilized including caps, mask, sterile gowns, sterile gloves, sterile drape, hand hygiene and skin antiseptic. A timeout was performed prior to the initiation of the procedure. Previous imaging reviewed. Patient positioned supine. Noncontrast localization CT performed. The anterior omental nodule mass was localized and marked for a right anterior oblique approach. Under sterile conditions local anesthesia, CT guidance was utilized to advance a 17 gauge guide needle into the anterior omental nodular mass. Needle position confirmed with CT. 18 gauge core biopsies obtained. Samples placed in formalin. These were intact and non fragmented. Needle removed. Postprocedure imaging demonstrates no hemorrhage or hematoma. Patient tolerated biopsy well. IMPRESSION: Successful CT-guided anterior omental mass 18 gauge core biopsies Electronically Signed   By: Jerilynn Mages.  Shick M.D.   On: 12/08/2020 11:07   IR Paracentesis  Result Date: 12/08/2020 INDICATION: Patient with history of metastatic breast cancer, recurrent ascites. Request received for therapeutic paracentesis. EXAM: ULTRASOUND GUIDED THERAPEUTIC PARACENTESIS MEDICATIONS: 1% lidocaine to skin and subcutaneous tissue COMPLICATIONS: None immediate. PROCEDURE: Informed written consent was obtained from the patient after a discussion of the risks, benefits and alternatives to treatment. A timeout was performed prior to the initiation of the procedure. Initial ultrasound scanning demonstrates a large amount of ascites within the left lower abdominal quadrant. The left lower abdomen was prepped and draped in the usual sterile fashion. 1% lidocaine was used for local  anesthesia. Following this, a 19 gauge, 7-cm, Yueh catheter was introduced. An ultrasound image was saved for documentation purposes. The paracentesis was performed. The catheter was removed and a dressing was applied. The patient tolerated the procedure well without immediate post procedural complication. FINDINGS: A total of approximately 6.9 liters of yellow fluid was removed. IMPRESSION: Successful ultrasound-guided therapeutic paracentesis yielding 6.9 liters of peritoneal fluid. Read by: Rowe Robert,  PA-C Electronically Signed   By: Jerilynn Mages.  Shick M.D.   On: 12/08/2020 09:54    ASSESSMENT: 60 y.o. Carolyn Barron woman status post left mastectomy and sentinel lymph node sampling in November 2010 for a T1c N0, stage IA invasive lobular carcinoma, grade 2, strongly estrogen and progesterone receptor positive, HER-2/neu negative, with a low proliferation fraction.   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  (d) cytology from paracentesis 11/20/2020 nondiagnostic  (e) biopsy of right adnexal mass 12/08/2020 most consistent with metastatic mammary carcinoma, estrogen receptor positive, HER2 nonamplified  (f) on 11/21/2020 the CEA was 6.2, CA 27-29 was 151.8, and Ca1 25 was 1108  (3) palliative radiation to the left periorbital mass completed 12/05/2020  (4) anastrozole started 11/20/2020  (a) to start  abemaciclib   PLAN:  Timberlee is tolerating the anastrozole generally well although she is feeling significantly "hot" at times in her home.  She has a significant problem with appetite and continues to lose weight.  Her albumin also has been dropping.  On the plus side the tumor markers suggest an early response.  The pathology came back most consistent with mammary carcinoma and this is good considering that the alternative would be ovarian cancer where there are much fewer treatment options.  I obtained additional lab work today to see if I could see an obvious cause of her feeling as bad as she did this past weekend.  The creatinine is actually improved, as is the alkaline phosphatase.  The transaminases remain normal.  Sugar was not elevated.  Overall this was reassuring.  We are trying to obtain abemaciclib for her.  She understands that she will be getting a call within the next 24hours to set that up and are oral chemotherapy pharmacist specialists are assisting her with this.  I am hopeful she will be able to start the abemaciclib sometime within the next 2 weeks.  Otherwise she is already scheduled to follow-up with Dr. Isidore Moos on 01/07/2021 and I will pick it back on that visit so she can have lab work and see me that day.  Total encounter time 25 minutes.Sarajane Jews C. Kathlene Yano MD Oncology and Hematology [facility] [place], [place] [address] Tel. [phone]  Joylene Igo [phone]   I, Wilburn Mylar, am acting as scribe for Dr. Sarajane Jews C. Ronnita Paz.  I, Lurline Del MD, have reviewed the above documentation for accuracy and completeness, and I agree with the above.   *Total Encounter Time as defined by the Centers for Medicare and Medicaid Services includes, in addition to the face-to-face time of a patient visit (documented in the note above) non-face-to-face time: obtaining and reviewing outside history, ordering and reviewing medications, tests or  procedures, care coordination (communications with other health care professionals or caregivers) and documentation in the medical record.

## 2020-12-17 ENCOUNTER — Other Ambulatory Visit: Payer: Self-pay | Admitting: Oncology

## 2020-12-17 LAB — CANCER ANTIGEN 27.29: CA 27.29: 165.4 U/mL — ABNORMAL HIGH (ref 0.0–38.6)

## 2020-12-17 NOTE — Progress Notes (Signed)
Carolyn Barron has tumor marker went up a little bit.  We really will want to add the CDK 4, 6 inhibitor as soon as she can get a hold of it.  Leron Croak and our oral chemotherapy pharmacy specialty department is working on it.

## 2020-12-18 LAB — SURGICAL PATHOLOGY

## 2020-12-18 NOTE — Telephone Encounter (Signed)
Oral Chemotherapy Pharmacist Encounter  I spoke with patient for overview of: Verzenio for the treatment of metastatic, hormone-receptor positive, HER-2 negative breast cancer, in combination with anastrozole, planned duration until disease progression or unacceptable toxicity.   Counseled patient on administration, dosing, side effects, monitoring, drug-food interactions, safe handling, storage, and disposal.  Patient will take Verzenio $RemoveBefor'150mg'[id number]$  tablets, 1 tablet by mouth twice daily without regard to food.  Patient knows to avoid grapefruit and grapefruit juice.  Verzenio start date: 12/19/20  Adverse effects include but are not limited to: diarrhea, fatigue, nausea, abdominal pain, hair loss, decreased blood counts, and increased liver function tests, and joint pains. Severe, life-threatening, and/or fatal interstitial lung disease (ILD) and/or pneumonitis may occur with CDK 4/6 inhibitors.  Patient has anti-emetic on hand and knows to take it if nausea develops.   Patient will obtain anti diarrheal and alert the office of 4 or more loose stools above baseline.  Reviewed with patient importance of keeping a medication schedule and plan for any missed doses. No barriers to medication adherence identified.  Medication reconciliation performed and medication/allergy list updated.  Insurance authorization for [employer] has been obtained. This will ship from [place] on 12/18/20 to deliver to patient's home on 12/19/20.  All questions answered.  Carolyn Barron voiced understanding and appreciation.   Medication education handout placed in mail for patient. Patient knows to call the office with questions or concerns. Oral Chemotherapy Clinic phone number provided to patient.   Leron Croak, PharmD, BCPS Hematology/Oncology Clinical Pharmacist [facility] [phone] 12/18/2020 3:51 PM

## 2020-12-19 LAB — FUNGUS CULTURE WITH STAIN

## 2020-12-19 LAB — FUNGUS CULTURE RESULT

## 2020-12-19 LAB — FUNGAL ORGANISM REFLEX

## 2020-12-25 ENCOUNTER — Encounter (HOSPITAL_COMMUNITY): Payer: Self-pay | Admitting: Emergency Medicine

## 2020-12-25 ENCOUNTER — Emergency Department (HOSPITAL_COMMUNITY): Payer: 59

## 2020-12-25 ENCOUNTER — Inpatient Hospital Stay (HOSPITAL_COMMUNITY)
Admission: EM | Admit: 2020-12-25 | Discharge: 2020-12-30 | DRG: 682 | Disposition: A | Discharge status: Home / Self Care | Payer: 59 | Attending: Internal Medicine | Admitting: Internal Medicine

## 2020-12-25 DIAGNOSIS — J9811 Atelectasis: Secondary | ICD-10-CM | POA: Diagnosis present

## 2020-12-25 DIAGNOSIS — C50812 Malignant neoplasm of overlapping sites of left female breast: Secondary | ICD-10-CM | POA: Diagnosis present

## 2020-12-25 DIAGNOSIS — E43 Unspecified severe protein-calorie malnutrition: Secondary | ICD-10-CM | POA: Diagnosis present

## 2020-12-25 DIAGNOSIS — N189 Chronic kidney disease, unspecified: Secondary | ICD-10-CM | POA: Diagnosis present

## 2020-12-25 DIAGNOSIS — N179 Acute kidney failure, unspecified: Secondary | ICD-10-CM | POA: Diagnosis not present

## 2020-12-25 DIAGNOSIS — R180 Malignant ascites: Secondary | ICD-10-CM

## 2020-12-25 DIAGNOSIS — Z79899 Other long term (current) drug therapy: Secondary | ICD-10-CM

## 2020-12-25 DIAGNOSIS — Z9012 Acquired absence of left breast and nipple: Secondary | ICD-10-CM

## 2020-12-25 DIAGNOSIS — Z170 Estrogen receptor positive status [ER+]: Secondary | ICD-10-CM

## 2020-12-25 DIAGNOSIS — R188 Other ascites: Secondary | ICD-10-CM | POA: Diagnosis present

## 2020-12-25 DIAGNOSIS — J90 Pleural effusion, not elsewhere classified: Secondary | ICD-10-CM | POA: Diagnosis present

## 2020-12-25 DIAGNOSIS — I10 Essential (primary) hypertension: Secondary | ICD-10-CM | POA: Diagnosis present

## 2020-12-25 DIAGNOSIS — Z801 Family history of malignant neoplasm of trachea, bronchus and lung: Secondary | ICD-10-CM

## 2020-12-25 DIAGNOSIS — C50919 Malignant neoplasm of unspecified site of unspecified female breast: Secondary | ICD-10-CM

## 2020-12-25 DIAGNOSIS — F17210 Nicotine dependence, cigarettes, uncomplicated: Secondary | ICD-10-CM | POA: Diagnosis present

## 2020-12-25 DIAGNOSIS — N9489 Other specified conditions associated with female genital organs and menstrual cycle: Secondary | ICD-10-CM | POA: Diagnosis present

## 2020-12-25 DIAGNOSIS — F32A Depression, unspecified: Secondary | ICD-10-CM | POA: Diagnosis present

## 2020-12-25 DIAGNOSIS — Z888 Allergy status to other drugs, medicaments and biological substances status: Secondary | ICD-10-CM

## 2020-12-25 DIAGNOSIS — Z20822 Contact with and (suspected) exposure to covid-19: Secondary | ICD-10-CM | POA: Diagnosis present

## 2020-12-25 DIAGNOSIS — Z9889 Other specified postprocedural states: Secondary | ICD-10-CM

## 2020-12-25 DIAGNOSIS — K219 Gastro-esophageal reflux disease without esophagitis: Secondary | ICD-10-CM | POA: Diagnosis present

## 2020-12-25 DIAGNOSIS — D649 Anemia, unspecified: Secondary | ICD-10-CM | POA: Diagnosis present

## 2020-12-25 LAB — COMPREHENSIVE METABOLIC PANEL
ALT: 12 U/L (ref 0–44)
AST: 25 U/L (ref 15–41)
Albumin: 1.9 g/dL — ABNORMAL LOW (ref 3.5–5.0)
Alkaline Phosphatase: 202 U/L — ABNORMAL HIGH (ref 38–126)
Anion gap: 7 (ref 5–15)
BUN: 24 mg/dL — ABNORMAL HIGH (ref 8–23)
CO2: 24 mmol/L (ref 22–32)
Calcium: 8.2 mg/dL — ABNORMAL LOW (ref 8.9–10.3)
Chloride: 105 mmol/L (ref 98–111)
Creatinine, Ser: 1.6 mg/dL — ABNORMAL HIGH (ref 0.44–1.00)
GFR, Estimated: 36 mL/min — ABNORMAL LOW (ref >60)
Glucose, Bld: 95 mg/dL (ref 70–99)
Potassium: 4.5 mmol/L (ref 3.5–5.1)
Sodium: 136 mmol/L (ref 135–145)
Total Bilirubin: 0.5 mg/dL (ref 0.3–1.2)
Total Protein: 6 g/dL — ABNORMAL LOW (ref 6.5–8.1)

## 2020-12-25 LAB — AMMONIA: Ammonia: 16 umol/L (ref 9–35)

## 2020-12-25 LAB — CBC
HCT: 29.1 % — ABNORMAL LOW (ref 36.0–46.0)
Hemoglobin: 8.9 g/dL — ABNORMAL LOW (ref 12.0–15.0)
MCH: 26.6 pg (ref 26.0–34.0)
MCHC: 30.6 g/dL (ref 30.0–36.0)
MCV: 87.1 fL (ref 80.0–100.0)
Platelets: 456 10*3/uL — ABNORMAL HIGH (ref 150–400)
RBC: 3.34 MIL/uL — ABNORMAL LOW (ref 3.87–5.11)
RDW: 17.8 % — ABNORMAL HIGH (ref 11.5–15.5)
WBC: 6.9 10*3/uL (ref 4.0–10.5)
nRBC: 0 % (ref 0.0–0.2)

## 2020-12-25 LAB — LIPASE, BLOOD: Lipase: 45 U/L (ref 11–51)

## 2020-12-25 LAB — RESP PANEL BY RT-PCR (FLU A&B, COVID) ARPGX2
Influenza A by PCR: NEGATIVE
Influenza B by PCR: NEGATIVE
SARS Coronavirus 2 by RT PCR: NEGATIVE

## 2020-12-25 MED ORDER — ATORVASTATIN CALCIUM 20 MG PO TABS
20.0000 mg | ORAL_TABLET | Freq: Every day | ORAL | Status: DC
  Administered 2020-12-25 – 2020-12-30 (×6): 20 mg via ORAL
  Filled 2020-12-25 (×2): qty 1
  Filled 2020-12-25: qty 2
  Filled 2020-12-25: qty 1
  Filled 2020-12-25: qty 2
  Filled 2020-12-25: qty 1

## 2020-12-25 MED ORDER — VITAMIN D 25 MCG (1000 UNIT) PO TABS
1000.0000 [IU] | ORAL_TABLET | Freq: Every day | ORAL | Status: DC
  Administered 2020-12-25 – 2020-12-30 (×6): 1000 [IU] via ORAL
  Filled 2020-12-25 (×6): qty 1

## 2020-12-25 MED ORDER — ZOLPIDEM TARTRATE 5 MG PO TABS: 5.0000 mg | ORAL_TABLET | Freq: Every evening | ORAL | Status: DC | PRN

## 2020-12-25 MED ORDER — MORPHINE SULFATE (PF) 2 MG/ML IV SOLN
2.0000 mg | INTRAVENOUS | Status: DC | PRN
  Administered 2020-12-26: 2 mg via INTRAVENOUS
  Filled 2020-12-25: qty 1

## 2020-12-25 MED ORDER — ALBUMIN HUMAN 25 % IV SOLN
50.0000 g | Freq: Once | INTRAVENOUS | Status: DC | PRN
  Filled 2020-12-25: qty 200

## 2020-12-25 MED ORDER — METHOCARBAMOL 1000 MG/10ML IJ SOLN
500.0000 mg | Freq: Four times a day (QID) | INTRAVENOUS | Status: DC | PRN
  Filled 2020-12-25: qty 5

## 2020-12-25 MED ORDER — ABEMACICLIB 150 MG PO TABS: 150.0000 mg | ORAL_TABLET | Freq: Two times a day (BID) | ORAL | Status: DC

## 2020-12-25 MED ORDER — ONDANSETRON HCL 4 MG/2ML IJ SOLN: 4.0000 mg | Freq: Four times a day (QID) | INTRAMUSCULAR | Status: DC | PRN

## 2020-12-25 MED ORDER — ENSURE ENLIVE PO LIQD
237.0000 mL | Freq: Two times a day (BID) | ORAL | Status: DC
  Administered 2020-12-25 – 2020-12-28 (×3): 237 mL via ORAL
  Filled 2020-12-25 (×3): qty 237

## 2020-12-25 MED ORDER — FAMOTIDINE 20 MG PO TABS
20.0000 mg | ORAL_TABLET | Freq: Every day | ORAL | Status: DC
  Administered 2020-12-25 – 2020-12-30 (×6): 20 mg via ORAL
  Filled 2020-12-25 (×6): qty 1

## 2020-12-25 MED ORDER — ACETAMINOPHEN 325 MG PO TABS
650.0000 mg | ORAL_TABLET | Freq: Four times a day (QID) | ORAL | Status: DC | PRN
  Administered 2020-12-28 – 2020-12-29 (×2): 650 mg via ORAL
  Filled 2020-12-25 (×2): qty 2

## 2020-12-25 MED ORDER — ALBUTEROL SULFATE HFA 108 (90 BASE) MCG/ACT IN AERS: 2.0000 | INHALATION_SPRAY | Freq: Four times a day (QID) | RESPIRATORY_TRACT | Status: DC | PRN

## 2020-12-25 MED ORDER — ALBUMIN HUMAN 25 % IV SOLN
12.5000 g | Freq: Once | INTRAVENOUS | Status: AC
  Administered 2020-12-25: 12.5 g via INTRAVENOUS
  Filled 2020-12-25: qty 50

## 2020-12-25 MED ORDER — POLYETHYLENE GLYCOL 3350 17 G PO PACK
17.0000 g | PACK | Freq: Every day | ORAL | Status: DC | PRN
  Filled 2020-12-25: qty 1

## 2020-12-25 MED ORDER — PAROXETINE HCL 20 MG PO TABS
40.0000 mg | ORAL_TABLET | ORAL | Status: DC
  Administered 2020-12-26 – 2020-12-30 (×5): 40 mg via ORAL
  Filled 2020-12-25 (×5): qty 2

## 2020-12-25 MED ORDER — ALBUTEROL SULFATE (2.5 MG/3ML) 0.083% IN NEBU: 2.5000 mg | INHALATION_SOLUTION | Freq: Four times a day (QID) | RESPIRATORY_TRACT | Status: DC | PRN

## 2020-12-25 MED ORDER — FENTANYL CITRATE (PF) 100 MCG/2ML IJ SOLN: 50.0000 ug | INTRAMUSCULAR | Status: DC | PRN

## 2020-12-25 MED ORDER — OXYCODONE HCL 5 MG PO TABS
5.0000 mg | ORAL_TABLET | ORAL | Status: DC | PRN
  Administered 2020-12-26 – 2020-12-30 (×7): 5 mg via ORAL
  Filled 2020-12-25 (×7): qty 1

## 2020-12-25 MED ORDER — ACETAMINOPHEN 650 MG RE SUPP: 650.0000 mg | Freq: Four times a day (QID) | RECTAL | Status: DC | PRN

## 2020-12-25 MED ORDER — ONDANSETRON HCL 4 MG PO TABS: 4.0000 mg | ORAL_TABLET | Freq: Four times a day (QID) | ORAL | Status: DC | PRN

## 2020-12-25 NOTE — ED Provider Notes (Signed)
[facility] DEPT Provider Note   CSN: [id number] Arrival date & time: 12/25/20  1140     History Chief Complaint  Patient presents with   Abdominal Pain    Carolyn Barron is a 60 y.o. female.  HPI Reports she is getting increasing abdominal distention and discomfort.  She has metastatic breast cancer.  She reports she has started to get recurrent abdominal bloating and distention.  She reports she has had 3 prior paracenteses for similar symptoms.  She reports most discomfort is in her lower abdomen with a lot of pressure sensation.  She reports he had a very difficult night sleeping last night due to all the discomfort associated with this.  He also reports she feels short of breath.  She reports that she walks to her kitchen from her living room she is immediately very short of breath.  No fever.  She reports she is having difficult time eating due to nausea and loss of appetite.  She reports that she more chronically has diarrhea due to her medications and has to take Imodium.  This is not a new problem.  She reports she does have some swelling in her legs.  That has been an ongoing problem.    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   Malignant neoplasm of overlapping sites of left breast in female, estrogen receptor positive ([place]) 11/24/2020   B12 deficiency 11/24/2020   Pleural effusion 11/20/2020   AKI (acute kidney injury) ([place]) 11/20/2020   Anemia 11/20/2020   Depression 11/20/2020   GERD (gastroesophageal reflux disease) 11/20/2020   Lymphadenopathy, mediastinal 11/20/2020   Pulmonary nodule 11/20/2020   Adnexal mass 11/20/2020   Ascites 11/19/2020    Past Surgical History:  Procedure  Laterality Date   CARPAL TUNNEL RELEASE     IR PARACENTESIS  12/08/2020   MASTECTOMY       OB History   No obstetric history on file.     Family History  Problem Relation Age of Onset   Lung cancer Father        smoker   Lung cancer Paternal Grandmother     Social History   Tobacco Use   Smoking status: Some Days    Packs/day: 0.50    Pack years: 0.00    Types: Cigarettes   Smokeless tobacco: Never  Vaping Use   Vaping Use: Never used  Substance Use Topics   Alcohol use: No   Drug use: No    Home Medications Prior to Admission medications   Medication Sig Start Date End Date Taking? Authorizing Provider  abemaciclib (VERZENIO) 150 MG tablet Take 1 tablet (150 mg total) by mouth 2 (two) times daily. Swallow tablets whole. Do not chew, crush, or split tablets before swallowing. Start 12/16/2020 12/11/20  Yes Magrinat, Virgie Dad, MD  acetaminophen (TYLENOL) 500 MG tablet Take 500 mg by mouth every 6 (six) hours as needed for moderate pain.   Yes [provider]  albuterol (VENTOLIN HFA) 108 (90 Base) MCG/ACT inhaler Inhale 2 puffs into the lungs every 6 (six) hours as needed for wheezing or shortness of breath.   Yes [provider]  anastrozole (  ARIMIDEX) 1 MG tablet Take 1 tablet (1 mg total) by mouth daily. 11/22/20  Yes Little Ishikawa, MD  atorvastatin (LIPITOR) 20 MG tablet Take 20 mg by mouth daily. 11/16/20  Yes [provider]  cholecalciferol (VITAMIN D3) 25 MCG (1000 UNIT) tablet Take 1,000 Units by mouth daily.   Yes [provider]  famotidine (PEPCID) 20 MG tablet Take 20 mg by mouth daily. 12/17/20  Yes [provider]  pantoprazole (PROTONIX) 20 MG tablet Take 20 mg by mouth daily.   Yes [provider]  PARoxetine (PAXIL) 40 MG tablet Take 40 mg by mouth every morning.   Yes [provider]    Allergies    Clindamycin/lincomycin  Review of Systems   Review of Systems 10 systems reviewed and  negative except as per HPI Physical Exam Updated Vital Signs BP 122/80   Pulse (!) 108   Temp 98 F (36.7 C) (Oral)   Resp 18   SpO2 95%   Physical Exam Constitutional:      Comments: Alert with clear mental status.  No respiratory distress at rest  HENT:     Head: Normocephalic and atraumatic.     Mouth/Throat:     Pharynx: Oropharynx is clear.  Eyes:     Extraocular Movements: Extraocular movements intact.  Cardiovascular:     Rate and Rhythm: Normal rate and regular rhythm.  Pulmonary:     Comments: No respiratory distress.  Occasional expiratory wheeze lower lung fields.  No gross crackle Abdominal:     Comments: Abdomen has moderate/large ascites distention.  No erythema of the abdominal wall.  Some mild diffuse suprapubic tenderness.  Musculoskeletal:     Comments: 1+ pitting at the ankles.  Skin:    General: Skin is warm and dry.  Neurological:     General: No focal deficit present.     Mental Status: She is oriented to person, place, and time.     Coordination: Coordination normal.  Psychiatric:        Mood and Affect: Mood normal.    ED Results / Procedures / Treatments   Labs (all labs ordered are listed, but only abnormal results are displayed) Labs Reviewed  COMPREHENSIVE METABOLIC PANEL - Abnormal; Notable for the following components:      Result Value   BUN 24 (*)    Creatinine, Ser 1.60 (*)    Calcium 8.2 (*)    Total Protein 6.0 (*)    Albumin 1.9 (*)    Alkaline Phosphatase 202 (*)    GFR, Estimated 36 (*)    All other components within normal limits  CBC - Abnormal; Notable for the following components:   RBC 3.34 (*)    Hemoglobin 8.9 (*)    HCT 29.1 (*)    RDW 17.8 (*)    Platelets 456 (*)    All other components within normal limits  RESP PANEL BY RT-PCR (FLU A&B, COVID) ARPGX2  LIPASE, BLOOD  AMMONIA  URINALYSIS, ROUTINE W REFLEX MICROSCOPIC    EKG None  Radiology CT Abdomen Pelvis Wo Contrast  Result Date:  12/25/2020 CLINICAL DATA:  Abdominal distension in a 59 year old female. History of breast cancer with peritoneal disease based on recent imaging. EXAM: CT ABDOMEN AND PELVIS WITHOUT CONTRAST TECHNIQUE: Multidetector CT imaging of the abdomen and pelvis was performed following the standard protocol without IV contrast. COMPARISON:  November 14, 2020. FINDINGS: Lower chest: Enlarging LEFT-sided pleural effusion as compared to recent imaging. Signs of coronary artery  disease and aortic valvular calcification. LEFT-sided effusion is moderately large previously small to moderate. Hepatobiliary: No gross hepatic abnormality with similar smooth contours of the liver. Gallbladder is nondistended. Some cholelithiasis is suspected versus sludge layering dependently. Pancreas: Pancreas with similar contours.  No signs of inflammation. Spleen: Spleen normal size and contour. Adrenals/Urinary Tract: Adrenal glands are normal. Smooth renal contours. No hydronephrosis. Urinary bladder is collapsed amidst large volume abdominal ascites. Nephrolithiasis in the lower pole of the LEFT kidney. Mild fullness of LEFT intrarenal collecting systems is similar to the previous exam without frank hydronephrosis, this involves the lower pole of the LEFT kidney. Stomach/Bowel: Marked gastric thickening. Bowel displaced by ascites into the central abdomen. No pneumatosis. No bowel obstruction. Appendix is normal. Vascular/Lymphatic: Calcified atheromatous plaque of the abdominal aorta without aneurysmal dilation. Upper abdominal adenopathy is suspected adjacent to the pancreatic head in the porta hepatis. This was seen on previous imaging not as well assessed on current examination. Estimate stability of these areas. LEFT retroperitoneal adenopathy (image 32/2) 1.6 cm short axis previously approximately 1.5 cm. Reproductive: Masslike appearance of the RIGHT ovary, soft tissue measuring approximately 6.3 cm greatest axial dimension along the RIGHT  pelvic sidewall grossly stable compared to previous imaging. No LEFT adnexal mass. No pelvic lymphadenopathy. Cm Other: Large volume ascites may be increased since the previous study. Distance between abdominal wall in liver increased from 2.83.9 cm. There was quite a large volume of ascites on the prior exam as well. No pneumoperitoneum. Marked abdominal distension with grossly similar appearance. Musculoskeletal: Spinal degenerative changes and signs of diffuse skeletal metastatic disease with similar appearance. IMPRESSION: 1. Large volume ascites may be slightly increased since the previous study. 2. Enlarging LEFT-sided pleural effusion. 3. Signs of peritoneal disease and adenopathy with similar appearance. 4. Diffuse marked thickening of the stomach suspicious for metastatic involvement. 5. Nephrolithiasis. 6. 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 7. Aortic atherosclerosis. Aortic Atherosclerosis (ICD10-I70.0). Electronically Signed   By: Zetta Bills M.D.   On: 12/25/2020 16:47   DG Chest 2 View  Result Date: 12/25/2020 CLINICAL DATA:  Shortness of breath EXAM: CHEST - 2 VIEW COMPARISON:  October 11, 2012 FINDINGS: There is mild right base atelectasis with right pleural effusion. Lungs elsewhere clear. Heart size and pulmonary vascularity are normal. No adenopathy. Patient is status post left mastectomy with surgical clips in the left axillary region. IMPRESSION: Right pleural effusion with right base atelectasis. Lungs otherwise clear. Heart size and pulmonary vascularity normal. Status post left mastectomy. Electronically Signed   By: Lowella Grip III M.D.   On: 12/25/2020 15:31    Procedures Procedures   Medications Ordered in ED Medications  fentaNYL (SUBLIMAZE) injection 50 mcg (has no administration in time range)    ED Course  I have reviewed the triage vital signs and the nursing notes.  Pertinent labs & imaging results that were available during my care of  the patient were reviewed by me and considered in my medical decision making (see chart for details).    MDM Rules/Calculators/A&P                          Worsening pain and shortness of breath with abdominal distention pleural effusions with metastatic breast cancer.  Will admit for therapeutic and diagnostic management.  At this time patient is stable.  She does not have impending respiratory failure oxygen saturations are stable. Final Clinical Impression(s) / ED Diagnoses Final diagnoses:  Pleural effusion  due to another disorder  Malignant ascites  Metastatic breast cancer [facility])    Rx / [place] Orders ED Discharge Orders     None        Charlesetta Shanks, MD 12/27/20 1541

## 2020-12-25 NOTE — ED Triage Notes (Signed)
Per EMS, states she is having abdominal swelling for the last 3 days related to her cancer treatment-has had same in the past which led to fluid needing to be removed from abdomin

## 2020-12-25 NOTE — ED Provider Notes (Signed)
Emergency Medicine Provider Triage Evaluation Note  Carolyn Barron , a 60 y.o. female  was evaluated in triage.  Pt complains of abd swelling that started for a few weeks but has worsened over the last few days. Has required paracentesis in the past.  Review of Systems  Positive: Abd bloating, nausea Negative: fever  Physical Exam  BP (!) 116/94 (BP Location: Right Arm)   Pulse (!) 110   Temp 98 F (36.7 C) (Oral)   Resp 16   SpO2 97%  Gen:   Awake, no distress   Resp:  Normal effort  MSK:   Moves extremities without difficulty  Other:  Abd distension, no ttp  Medical Decision Making  Medically screening exam initiated at 12:07 PM.  Appropriate orders placed.  Carolyn Barron was informed that the remainder of the evaluation will be completed by another provider, this initial triage assessment does not replace that evaluation, and the importance of remaining in the ED until their evaluation is complete.    Bishop Dublin 12/25/20 1208    Fredia Sorrow, MD 12/31/20 1616

## 2020-12-26 ENCOUNTER — Observation Stay (HOSPITAL_COMMUNITY): Payer: 59

## 2020-12-26 ENCOUNTER — Encounter (HOSPITAL_COMMUNITY): Payer: Self-pay | Admitting: Family Medicine

## 2020-12-26 ENCOUNTER — Other Ambulatory Visit: Payer: Self-pay

## 2020-12-26 DIAGNOSIS — Z9012 Acquired absence of left breast and nipple: Secondary | ICD-10-CM | POA: Diagnosis not present

## 2020-12-26 DIAGNOSIS — Z20822 Contact with and (suspected) exposure to covid-19: Secondary | ICD-10-CM | POA: Diagnosis present

## 2020-12-26 DIAGNOSIS — J90 Pleural effusion, not elsewhere classified: Secondary | ICD-10-CM | POA: Diagnosis present

## 2020-12-26 DIAGNOSIS — E43 Unspecified severe protein-calorie malnutrition: Secondary | ICD-10-CM | POA: Diagnosis present

## 2020-12-26 DIAGNOSIS — Z888 Allergy status to other drugs, medicaments and biological substances status: Secondary | ICD-10-CM | POA: Diagnosis not present

## 2020-12-26 DIAGNOSIS — K219 Gastro-esophageal reflux disease without esophagitis: Secondary | ICD-10-CM | POA: Diagnosis present

## 2020-12-26 DIAGNOSIS — N179 Acute kidney failure, unspecified: Secondary | ICD-10-CM | POA: Diagnosis present

## 2020-12-26 DIAGNOSIS — I10 Essential (primary) hypertension: Secondary | ICD-10-CM | POA: Diagnosis present

## 2020-12-26 DIAGNOSIS — D649 Anemia, unspecified: Secondary | ICD-10-CM | POA: Diagnosis present

## 2020-12-26 DIAGNOSIS — Z170 Estrogen receptor positive status [ER+]: Secondary | ICD-10-CM | POA: Diagnosis not present

## 2020-12-26 DIAGNOSIS — F17210 Nicotine dependence, cigarettes, uncomplicated: Secondary | ICD-10-CM | POA: Diagnosis present

## 2020-12-26 DIAGNOSIS — Z79899 Other long term (current) drug therapy: Secondary | ICD-10-CM | POA: Diagnosis not present

## 2020-12-26 DIAGNOSIS — C50812 Malignant neoplasm of overlapping sites of left female breast: Secondary | ICD-10-CM | POA: Diagnosis present

## 2020-12-26 DIAGNOSIS — Z801 Family history of malignant neoplasm of trachea, bronchus and lung: Secondary | ICD-10-CM | POA: Diagnosis not present

## 2020-12-26 DIAGNOSIS — J9811 Atelectasis: Secondary | ICD-10-CM | POA: Diagnosis present

## 2020-12-26 DIAGNOSIS — F32A Depression, unspecified: Secondary | ICD-10-CM | POA: Diagnosis present

## 2020-12-26 DIAGNOSIS — R180 Malignant ascites: Secondary | ICD-10-CM | POA: Diagnosis present

## 2020-12-26 LAB — CBC
HCT: 25 % — ABNORMAL LOW (ref 36.0–46.0)
Hemoglobin: 7.7 g/dL — ABNORMAL LOW (ref 12.0–15.0)
MCH: 27 pg (ref 26.0–34.0)
MCHC: 30.8 g/dL (ref 30.0–36.0)
MCV: 87.7 fL (ref 80.0–100.0)
Platelets: 389 10*3/uL (ref 150–400)
RBC: 2.85 MIL/uL — ABNORMAL LOW (ref 3.87–5.11)
RDW: 17.8 % — ABNORMAL HIGH (ref 11.5–15.5)
WBC: 6.5 10*3/uL (ref 4.0–10.5)
nRBC: 0 % (ref 0.0–0.2)

## 2020-12-26 LAB — PROTIME-INR
INR: 1.2 (ref 0.8–1.2)
Prothrombin Time: 15.1 seconds (ref 11.4–15.2)

## 2020-12-26 LAB — COMPREHENSIVE METABOLIC PANEL
ALT: 12 U/L (ref 0–44)
AST: 23 U/L (ref 15–41)
Albumin: 2 g/dL — ABNORMAL LOW (ref 3.5–5.0)
Alkaline Phosphatase: 172 U/L — ABNORMAL HIGH (ref 38–126)
Anion gap: 8 (ref 5–15)
BUN: 25 mg/dL — ABNORMAL HIGH (ref 8–23)
CO2: 23 mmol/L (ref 22–32)
Calcium: 8 mg/dL — ABNORMAL LOW (ref 8.9–10.3)
Chloride: 104 mmol/L (ref 98–111)
Creatinine, Ser: 1.61 mg/dL — ABNORMAL HIGH (ref 0.44–1.00)
GFR, Estimated: 36 mL/min — ABNORMAL LOW (ref >60)
Glucose, Bld: 84 mg/dL (ref 70–99)
Potassium: 4 mmol/L (ref 3.5–5.1)
Sodium: 135 mmol/L (ref 135–145)
Total Bilirubin: 0.4 mg/dL (ref 0.3–1.2)
Total Protein: 5.4 g/dL — ABNORMAL LOW (ref 6.5–8.1)

## 2020-12-26 LAB — LACTATE DEHYDROGENASE, PLEURAL OR PERITONEAL FLUID: LD, Fluid: 118 U/L — ABNORMAL HIGH (ref 3–23)

## 2020-12-26 LAB — BODY FLUID CELL COUNT WITH DIFFERENTIAL
Eos, Fluid: 0 %
Lymphs, Fluid: 44 %
Monocyte-Macrophage-Serous Fluid: 20 % — ABNORMAL LOW (ref 50–90)
Neutrophil Count, Fluid: 36 % — ABNORMAL HIGH (ref 0–25)
Total Nucleated Cell Count, Fluid: 692 cu mm (ref 0–1000)

## 2020-12-26 LAB — HEMOGLOBIN AND HEMATOCRIT, BLOOD
HCT: 26.2 % — ABNORMAL LOW (ref 36.0–46.0)
Hemoglobin: 8 g/dL — ABNORMAL LOW (ref 12.0–15.0)

## 2020-12-26 LAB — GLUCOSE, PLEURAL OR PERITONEAL FLUID: Glucose, Fluid: 81 mg/dL

## 2020-12-26 LAB — MAGNESIUM: Magnesium: 2.3 mg/dL (ref 1.7–2.4)

## 2020-12-26 LAB — HIV ANTIBODY (ROUTINE TESTING W REFLEX): HIV Screen 4th Generation wRfx: NONREACTIVE

## 2020-12-26 MED ORDER — LIDOCAINE HCL 1 % IJ SOLN
INTRAMUSCULAR | Status: AC
  Filled 2020-12-26: qty 20

## 2020-12-26 NOTE — ED Notes (Signed)
Pt A&O x4. Resting quietly. Attached to cardiac monitor x3. VSS. NAD. Pt denies any complaints or concerns at this time.

## 2020-12-26 NOTE — ED Notes (Signed)
Patient off the floor for ultasound

## 2020-12-26 NOTE — ED Notes (Signed)
Pt in US at this time 

## 2020-12-26 NOTE — H&P (Signed)
TRH H&P    Patient Demographics:    Carolyn Barron, is a 60 y.o. female  MRN: [id number]  DOB - 06-Jul-1961  Admit Date - 12/25/2020  Referring MD/NP/PA: Tivis Ringer  Outpatient Primary MD for the patient is [name], DO  Patient coming from: Home  Chief complaint- ascites   HPI:    Carolyn Barron  is a 60 y.o. female, with history of breast cancer, hypertension, vertigo who presents to the ED with a chief complaint of fluid on her stomach.  Patient reports that every 2 to 3 weeks she has to have a paracentesis.  Her first 1 removed 9 L here in the hospital.  She was advised to set these up outpatient, and her second 1 was done outpatient and removed 5 L.  This time the fluid has been building up over 1 week, but started to get really bad 2 days ago.  She reports by the third week of the interval between paracenteses, patient can barely move.  She reports no known history of liver disease.  She reports her abdomen is very painful, especially in the left lower and right lower quadrant.  She did recently have a biopsy she has associated with her pain.  Pain is sharp and achy, and intermittent.  The last time she experienced this pain was 3 days ago per her report.  Patient reports that the pain is relieved with Tylenol.  The fluid buildup makes the pain worse.  She has had normal urine output.  She does report that her bowel movements have been small in caliber compared to her normal.  She has been taking Imodium because the antineoplastic drug that she is on is known to cause diarrhea.  Patient has not had diarrhea.  She has been on this new antineoplastic medication for 6 days.  Patient endorses occasional chest pain.  Last time she had chest pain was 2 days ago.  I went away spontaneously.  She reports is also a side effect of her new medication.  She thinks that total time it was there was 1 to 2 hours.  She cannot describe it as  pressure burning or sharp.  She denies any palpitations.  Patient endorses nausea and dry heaves.  Her last normal meal was the day prior to presentation in the ER.  She reports drinking plenty water and ginger ale at home.  She has had no fevers.  She does endorse dyspnea on exertion that that is also worse when she has fluid buildup in her abdomen.  She endorses weakness and fatigue as well.  Patient is a current smoker, does not drink alcohol, does not use illicit drugs.  Patient is vaccinated for COVID.  Patient is full code.  ED Temp 98, heart rate 103-110, respiratory 16-18, blood pressure 122/80, satting at 95% No leukocytosis with a white blood cell count of 6.9 Chemistry panel reveals an AKI with a creatinine of 1.60, up from 1.25 Negative respiratory panel CT abdomen pelvis shows a large volume ascites, enlarging left pleural effusion, thickening of the  stomach which is suspected for metastasis, diffuse skeletal mets Chest x-ray shows right pleural effusion with atelectasis Patient was given fentanyl, UA pending, patient made n.p.o. Admission requested for paracentesis    Review of systems:    In addition to the HPI above,  No Fever-chills, No Headache, No changes with Vision or hearing, No problems swallowing food or Liquids, Endorses chest pain and shortness of Breath, Endorses abdominal pain and nausea but no vomiting bowel movements are regular, No Blood in stool or Urine, No dysuria, No new skin rashes or bruises, No new joints pains-aches,  No new weakness, tingling, numbness in any extremity, No polyuria, polydypsia or polyphagia, No significant Mental Stressors.  All other systems reviewed and are negative.    Past History of the following :    Past Medical History:  Diagnosis Date   Breast cancer ([place])    Cancer ([place])    Dyspnea    Hypertension    Vertigo       Past Surgical History:  Procedure Laterality Date   CARPAL TUNNEL RELEASE     IR  PARACENTESIS  12/08/2020   MASTECTOMY        Social History:      Social History   Tobacco Use   Smoking status: Some Days    Packs/day: 0.50    Pack years: 0.00    Types: Cigarettes   Smokeless tobacco: Never  Substance Use Topics   Alcohol use: No       Family History :     Family History  Problem Relation Age of Onset   Lung cancer Father        smoker   Lung cancer Paternal Grandmother       Home Medications:   Prior to Admission medications   Medication Sig Start Date End Date Taking? Authorizing Provider  abemaciclib (VERZENIO) 150 MG tablet Take 1 tablet (150 mg total) by mouth 2 (two) times daily. Swallow tablets whole. Do not chew, crush, or split tablets before swallowing. Start 12/16/2020 12/11/20  Yes Magrinat, Virgie Dad, MD  acetaminophen (TYLENOL) 500 MG tablet Take 500 mg by mouth every 6 (six) hours as needed for moderate pain.   Yes [provider]  albuterol (VENTOLIN HFA) 108 (90 Base) MCG/ACT inhaler Inhale 2 puffs into the lungs every 6 (six) hours as needed for wheezing or shortness of breath.   Yes [provider]  anastrozole (ARIMIDEX) 1 MG tablet Take 1 tablet (1 mg total) by mouth daily. 11/22/20  Yes Little Ishikawa, MD  atorvastatin (LIPITOR) 20 MG tablet Take 20 mg by mouth daily. 11/16/20  Yes [provider]  cholecalciferol (VITAMIN D3) 25 MCG (1000 UNIT) tablet Take 1,000 Units by mouth daily.   Yes [provider]  famotidine (PEPCID) 20 MG tablet Take 20 mg by mouth daily. 12/17/20  Yes [provider]  pantoprazole (PROTONIX) 20 MG tablet Take 20 mg by mouth daily.   Yes [provider]  PARoxetine (PAXIL) 40 MG tablet Take 40 mg by mouth every morning.   Yes [provider]     Allergies:     Allergies  Allergen Reactions   Clindamycin/Lincomycin Rash     Physical Exam:   Vitals  Blood pressure 124/76, pulse (!) 101, temperature 98 F (36.7 C), temperature  source Oral, resp. rate 17, SpO2 92 %.  1.  General: Patient lying supine in bed, appearing older than stated age, no acute distress   2. Psychiatric: Alert  and oriented x 3, mood and behavior normal for situation, pleasant and cooperative with exam   3. Neurologic: Speech and language are normal, face is symmetric, moves all 4 extremities voluntarily, at baseline without acute deficits on limited exam   4. HEENMT:  Head is atraumatic, normocephalic, pupils reactive to light, neck is supple, trachea is midline, mucous membranes are moist   5. Respiratory : Lungs with bilateral wheezing, no rhonchi, rales, no cyanosis, no increase in work of breathing or accessory muscle use   6. Cardiovascular : Heart rate tachycardic, rhythm is regular, no murmurs, rubs or gallops, no peripheral edema, peripheral pulses palpated   7. Gastrointestinal:  Abdomen is soft, ascites present, tender in the left lower quadrant to palpation, bowel sounds active, no masses or organomegaly palpated   8. Skin:  Skin is warm, dry and intact without rashes, acute lesions, or ulcers on limited exam   9.Musculoskeletal:  No acute deformities or trauma, no asymmetry in tone, no peripheral edema, peripheral pulses palpated, no tenderness to palpation in the extremities    Data Review:    CBC Recent Labs  Lab 12/25/20 1235 12/26/20 0430  WBC 6.9 6.5  HGB 8.9* 7.7*  HCT 29.1* 25.0*  PLT 456* 389  MCV 87.1 87.7  MCH 26.6 27.0  MCHC 30.6 30.8  RDW 17.8* 17.8*   ------------------------------------------------------------------------------------------------------------------  Results for orders placed or performed during the hospital encounter of 12/25/20 (from the past 48 hour(s))  Lipase, blood     Status: None   Collection Time: 12/25/20 12:35 PM  Result Value Ref Range   Lipase 45 11 - 51 U/L    Comment: Performed at [facility], [place] [address], [place], [place] [address]   Comprehensive metabolic panel     Status: Abnormal   Collection Time: 12/25/20 12:35 PM  Result Value Ref Range   Sodium 136 135 - 145 mmol/L   Potassium 4.5 3.5 - 5.1 mmol/L   Chloride 105 98 - 111 mmol/L   CO2 24 22 - 32 mmol/L   Glucose, Bld 95 70 - 99 mg/dL    Comment: Glucose reference range applies only to samples taken after fasting for at least 8 hours.   BUN 24 (H) 8 - 23 mg/dL   Creatinine, Ser 1.60 (H) 0.44 - 1.00 mg/dL   Calcium 8.2 (L) 8.9 - 10.3 mg/dL   Total Protein 6.0 (L) 6.5 - 8.1 g/dL   Albumin 1.9 (L) 3.5 - 5.0 g/dL   AST 25 15 - 41 U/L   ALT 12 0 - 44 U/L   Alkaline Phosphatase 202 (H) 38 - 126 U/L   Total Bilirubin 0.5 0.3 - 1.2 mg/dL   GFR, Estimated 36 (L) >60 mL/min    Comment: (NOTE) Calculated using the CKD-EPI Creatinine Equation (2021)    Anion gap 7 5 - 15    Comment: Performed at [facility], [place] [address], [place], [place] [address]  CBC     Status: Abnormal   Collection Time: 12/25/20 12:35 PM  Result Value Ref Range   WBC 6.9 4.0 - 10.5 K/uL   RBC 3.34 (L) 3.87 - 5.11 MIL/uL   Hemoglobin 8.9 (L) 12.0 - 15.0 g/dL   HCT 29.1 (L) 36.0 - 46.0 %   MCV 87.1 80.0 - 100.0 fL   MCH 26.6 26.0 - 34.0 pg   MCHC 30.6 30.0 - 36.0 g/dL   RDW 17.8 (H) 11.5 - 15.5 %   Platelets 456 (  H) 150 - 400 K/uL   nRBC 0.0 0.0 - 0.2 %    Comment: Performed at [facility], [place] [address], [place], [place] [address]  Ammonia     Status: None   Collection Time: 12/25/20  2:36 PM  Result Value Ref Range   Ammonia 16 9 - 35 umol/L    Comment: Performed at [facility], [place] [address] [place], [place] [address]  Resp Panel by RT-PCR (Flu A&B, Covid) Nasopharyngeal Swab     Status: None   Collection Time: 12/25/20  2:37 PM   Specimen: Nasopharyngeal Swab; Nasopharyngeal(NP) swabs in vial transport medium  Result Value Ref Range   SARS Coronavirus 2 by RT PCR NEGATIVE NEGATIVE    Comment: (NOTE) SARS-CoV-2  target nucleic acids are NOT DETECTED.  The SARS-CoV-2 RNA is generally detectable in upper respiratory specimens during the acute phase of infection. The lowest concentration of SARS-CoV-2 viral copies this assay can detect is 138 copies/mL. A negative result does not preclude SARS-Cov-2 infection and should not be used as the sole basis for treatment or other patient management decisions. A negative result may occur with  improper specimen collection/handling, submission of specimen other than nasopharyngeal swab, presence of viral mutation(s) within the areas targeted by this assay, and inadequate number of viral copies(<138 copies/mL). A negative result must be combined with clinical observations, patient history, and epidemiological information. The expected result is Negative.  Fact Sheet for Patients:  EntrepreneurPulse.com.au  Fact Sheet for Healthcare Providers:  IncredibleEmployment.be  This test is no t yet approved or cleared by the [place] FDA and  has been authorized for detection and/or diagnosis of SARS-CoV-2 by FDA under an Emergency Use Authorization (EUA). This EUA will remain  in effect (meaning this test can be used) for the duration of the COVID-19 declaration under Section 564(b)(1) of the Act, 21 U.S.C.section 360bbb-3(b)(1), unless the authorization is terminated  or revoked sooner.       Influenza A by PCR NEGATIVE NEGATIVE   Influenza B by PCR NEGATIVE NEGATIVE    Comment: (NOTE) The Xpert Xpress SARS-CoV-2/FLU/RSV plus assay is intended as an aid in the diagnosis of influenza from Nasopharyngeal swab specimens and should not be used as a sole basis for treatment. Nasal washings and aspirates are unacceptable for Xpert Xpress SARS-CoV-2/FLU/RSV testing.  Fact Sheet for Patients: EntrepreneurPulse.com.au  Fact Sheet for Healthcare Providers: IncredibleEmployment.be  This  test is not yet approved or cleared by the [place] FDA and has been authorized for detection and/or diagnosis of SARS-CoV-2 by FDA under an Emergency Use Authorization (EUA). This EUA will remain in effect (meaning this test can be used) for the duration of the COVID-19 declaration under Section 564(b)(1) of the Act, 21 U.S.C. section 360bbb-3(b)(1), unless the authorization is terminated or revoked.  Performed at [facility], [place] [address], [place], [place] [address]   HIV Antibody (routine testing w rflx)     Status: None   Collection Time: 12/25/20  9:52 PM  Result Value Ref Range   HIV Screen 4th Generation wRfx Non Reactive Non Reactive    Comment: Performed at [facility], [place] [address], [place],  [address]  Comprehensive metabolic panel     Status: Abnormal   Collection Time: 12/26/20  4:30 AM  Result Value Ref Range   Sodium 135 135 - 145 mmol/L   Potassium 4.0 3.5 - 5.1 mmol/L   Chloride 104 98 - 111 mmol/L  CO2 23 22 - 32 mmol/L   Glucose, Bld 84 70 - 99 mg/dL    Comment: Glucose reference range applies only to samples taken after fasting for at least 8 hours.   BUN 25 (H) 8 - 23 mg/dL   Creatinine, Ser 1.61 (H) 0.44 - 1.00 mg/dL   Calcium 8.0 (L) 8.9 - 10.3 mg/dL   Total Protein 5.4 (L) 6.5 - 8.1 g/dL   Albumin 2.0 (L) 3.5 - 5.0 g/dL   AST 23 15 - 41 U/L   ALT 12 0 - 44 U/L   Alkaline Phosphatase 172 (H) 38 - 126 U/L   Total Bilirubin 0.4 0.3 - 1.2 mg/dL   GFR, Estimated 36 (L) >60 mL/min    Comment: (NOTE) Calculated using the CKD-EPI Creatinine Equation (2021)    Anion gap 8 5 - 15    Comment: Performed at [facility], [place] [address], [place], [place] [address]  Magnesium     Status: None   Collection Time: 12/26/20  4:30 AM  Result Value Ref Range   Magnesium 2.3 1.7 - 2.4 mg/dL    Comment: Performed at [facility], [place] [address], [place], [place] [address]  CBC     Status:  Abnormal   Collection Time: 12/26/20  4:30 AM  Result Value Ref Range   WBC 6.5 4.0 - 10.5 K/uL   RBC 2.85 (L) 3.87 - 5.11 MIL/uL   Hemoglobin 7.7 (L) 12.0 - 15.0 g/dL   HCT 25.0 (L) 36.0 - 46.0 %   MCV 87.7 80.0 - 100.0 fL   MCH 27.0 26.0 - 34.0 pg   MCHC 30.8 30.0 - 36.0 g/dL   RDW 17.8 (H) 11.5 - 15.5 %   Platelets 389 150 - 400 K/uL   nRBC 0.0 0.0 - 0.2 %    Comment: Performed at [facility], [place] [address] [place],  [address]    Chemistries  Recent Labs  Lab 12/25/20 1235 12/26/20 0430  NA 136 135  K 4.5 4.0  CL 105 104  CO2 24 23  GLUCOSE 95 84  BUN 24* 25*  CREATININE 1.60* 1.61*  CALCIUM 8.2* 8.0*  MG  --  2.3  AST 25 23  ALT 12 12  ALKPHOS 202* 172*  BILITOT 0.5 0.4   ------------------------------------------------------------------------------------------------------------------  ------------------------------------------------------------------------------------------------------------------ GFR: Estimated Creatinine Clearance: 32.1 mL/min (A) (by C-G formula based on SCr of 1.61 mg/dL (H)). Liver Function Tests: Recent Labs  Lab 12/25/20 1235 12/26/20 0430  AST 25 23  ALT 12 12  ALKPHOS 202* 172*  BILITOT 0.5 0.4  PROT 6.0* 5.4*  ALBUMIN 1.9* 2.0*   Recent Labs  Lab 12/25/20 1235  LIPASE 45   Recent Labs  Lab 12/25/20 1436  AMMONIA 16   Coagulation Profile: No results for input(s): INR, PROTIME in the last 168 hours. Cardiac Enzymes: No results for input(s): CKTOTAL, CKMB, CKMBINDEX, TROPONINI in the last 168 hours. BNP (last 3 results) No results for input(s): PROBNP in the last 8760 hours. HbA1C: No results for input(s): HGBA1C in the last 72 hours. CBG: No results for input(s): GLUCAP in the last 168 hours. Lipid Profile: No results for input(s): CHOL, HDL, LDLCALC, TRIG, CHOLHDL, LDLDIRECT in the last 72 hours. Thyroid Function Tests: No results for input(s): TSH, T4TOTAL, FREET4, T3FREE, THYROIDAB  in the last 72 hours. Anemia Panel: No results for input(s): VITAMINB12, FOLATE, FERRITIN, TIBC, IRON, RETICCTPCT in the last 72 hours.  --------------------------------------------------------------------------------------------------------------- Urine analysis:  Component Value Date/Time   COLORURINE YELLOW 04/16/2012 1637   APPEARANCEUR CLOUDY (A) 04/16/2012 1637   LABSPEC 1.022 04/16/2012 1637   PHURINE 6.0 04/16/2012 1637   GLUCOSEU NEGATIVE 04/16/2012 1637   HGBUR NEGATIVE 04/16/2012 1637   BILIRUBINUR NEGATIVE 04/16/2012 1637   KETONESUR NEGATIVE 04/16/2012 1637   PROTEINUR NEGATIVE 04/16/2012 1637   UROBILINOGEN 0.2 04/16/2012 1637   NITRITE NEGATIVE 04/16/2012 1637   LEUKOCYTESUR NEGATIVE 04/16/2012 1637      Imaging Results:    CT Abdomen Pelvis Wo Contrast  Result Date: 12/25/2020 CLINICAL DATA:  Abdominal distension in a 60 year old female. History of breast cancer with peritoneal disease based on recent imaging. EXAM: CT ABDOMEN AND PELVIS WITHOUT CONTRAST TECHNIQUE: Multidetector CT imaging of the abdomen and pelvis was performed following the standard protocol without IV contrast. COMPARISON:  November 14, 2020. FINDINGS: Lower chest: Enlarging LEFT-sided pleural effusion as compared to recent imaging. Signs of coronary artery disease and aortic valvular calcification. LEFT-sided effusion is moderately large previously small to moderate. Hepatobiliary: No gross hepatic abnormality with similar smooth contours of the liver. Gallbladder is nondistended. Some cholelithiasis is suspected versus sludge layering dependently. Pancreas: Pancreas with similar contours.  No signs of inflammation. Spleen: Spleen normal size and contour. Adrenals/Urinary Tract: Adrenal glands are normal. Smooth renal contours. No hydronephrosis. Urinary bladder is collapsed amidst large volume abdominal ascites. Nephrolithiasis in the lower pole of the LEFT kidney. Mild fullness of LEFT intrarenal  collecting systems is similar to the previous exam without frank hydronephrosis, this involves the lower pole of the LEFT kidney. Stomach/Bowel: Marked gastric thickening. Bowel displaced by ascites into the central abdomen. No pneumatosis. No bowel obstruction. Appendix is normal. Vascular/Lymphatic: Calcified atheromatous plaque of the abdominal aorta without aneurysmal dilation. Upper abdominal adenopathy is suspected adjacent to the pancreatic head in the porta hepatis. This was seen on previous imaging not as well assessed on current examination. Estimate stability of these areas. LEFT retroperitoneal adenopathy (image 32/2) 1.6 cm short axis previously approximately 1.5 cm. Reproductive: Masslike appearance of the RIGHT ovary, soft tissue measuring approximately 6.3 cm greatest axial dimension along the RIGHT pelvic sidewall grossly stable compared to previous imaging. No LEFT adnexal mass. No pelvic lymphadenopathy. Cm Other: Large volume ascites may be increased since the previous study. Distance between abdominal wall in liver increased from 2.83.9 cm. There was quite a large volume of ascites on the prior exam as well. No pneumoperitoneum. Marked abdominal distension with grossly similar appearance. Musculoskeletal: Spinal degenerative changes and signs of diffuse skeletal metastatic disease with similar appearance. IMPRESSION: 1. Large volume ascites may be slightly increased since the previous study. 2. Enlarging LEFT-sided pleural effusion. 3. Signs of peritoneal disease and adenopathy with similar appearance. 4. Diffuse marked thickening of the stomach suspicious for metastatic involvement. 5. Nephrolithiasis. 6. 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 7. Aortic atherosclerosis. Aortic Atherosclerosis (ICD10-I70.0). Electronically Signed   By: Zetta Bills M.D.   On: 12/25/2020 16:47   DG Chest 2 View  Result Date: 12/25/2020 CLINICAL DATA:  Shortness of breath EXAM: CHEST - 2  VIEW COMPARISON:  October 11, 2012 FINDINGS: There is mild right base atelectasis with right pleural effusion. Lungs elsewhere clear. Heart size and pulmonary vascularity are normal. No adenopathy. Patient is status post left mastectomy with surgical clips in the left axillary region. IMPRESSION: Right pleural effusion with right base atelectasis. Lungs otherwise clear. Heart size and pulmonary vascularity normal. Status post left mastectomy. Electronically Signed   By: Lowella Grip  III M.D.   On: 12/25/2020 15:31       Assessment & Plan:    Principal Problem:   AKI (acute kidney injury) ([place]) Active Problems:   Ascites   Pleural effusion   Anemia   Severe protein-calorie malnutrition (HCC)   AKI Creatinine was 1.25 on May 31, today 1.60 On PPI Most likely etiology is antineoplastic agent Trend creatinine in the a.m. Continue to monitor Ascites IR paracentesis in the a.m. Albumin ordered.  After the paracentesis Pleural effusion Patient has never had a thoracentesis before Thoracentesis ordered ED provider initially mention general surgery for Pleurx catheter, but since this is the first time having a pleural effusion tapped, Pleurx catheter may not be necessary now, general surgery not consulted Severe protein calorie malnutrition Albumin 1.9 Patient currently n.p.o. for IR procedures Patient is able to tolerate p.o. consider adding protein shake Continue to monitor Anemia 1 month ago was 11.4, today 8.9 Seems to have been a steady drop over the last month If a.m. labs reveal continuing downtrend consider anemia panel and FOBT Continue to monitor   DVT Prophylaxis-   SCDs   AM Labs Ordered, also please review Full Orders  Family Communication: No family at bedside Code Status: Full  Admission status: Observation Time spent in minutes : [place] DO

## 2020-12-26 NOTE — ED Notes (Signed)
Report given to floor nurse, Landis Martins, RN.

## 2020-12-26 NOTE — ED Notes (Signed)
Patient had a bowel movement. Patient cleaned up.

## 2020-12-26 NOTE — ED Notes (Signed)
Messaged hsopitalist regarding pt's PO morning medications. Per Hospitalist, Dr. Dwyane Dee, pt to receive PO meds with small sips of water. Will hold Ensure at this time.

## 2020-12-26 NOTE — ED Notes (Signed)
Patient was given her lunch tray. 

## 2020-12-26 NOTE — Procedures (Signed)
PROCEDURE SUMMARY:  Successful US guided paracentesis from right abdomen Yielded 6.7 L of clear yellow fluid.  No immediate complications.  Pt tolerated well.   Specimen sent for labs.  EBL < 1 mL  Theresa Duty, NP 12/26/2020 1:02 PM

## 2020-12-26 NOTE — ED Notes (Signed)
Messaged hospitalist regarding pt's NPO status. Per Dr. Dwyane Dee, pt is able to have PO intake at this time and was given lunch tray.

## 2020-12-26 NOTE — ED Notes (Signed)
Pt back from [place] at this time. This nurse entered the pt's room to find the pt was not connected to the cardiac monitor. Pt re-attached to cardiac monitor x3 at this time.

## 2020-12-26 NOTE — ED Notes (Signed)
Dr. Clearence Ped said patient is allowed to have her Paxil medication with sips of water.

## 2020-12-26 NOTE — ED Notes (Signed)
Messaged hospitalist regarding pt's scheduled thoracentesis/paracentesis. Per Dr. Dwyane Dee, hospitalist, pt to remain NPO at this time d/t scheduled procedures.

## 2020-12-26 NOTE — Plan of Care (Signed)
Patient admission complete. Patient ambulating in room and hall. Pain meds given and patient is resting with out voicing any more concerns. Problem: Coping: Goal: Level of anxiety will decrease Outcome: Progressing   Problem: Pain Managment: Goal: General experience of comfort will improve Outcome: Progressing   Problem: Skin Integrity: Goal: Risk for impaired skin integrity will decrease Outcome: Progressing

## 2020-12-26 NOTE — Care Plan (Signed)
This 60 years old female with PMH significant for breast cancer on chemotherapy, hypertension, vertigo presented in the ED with chief complaints of abdominal distention.  Patient reports that every 2 to 3 weeks she has to have a paracentesis.  She denies any history of liver disease.  Patient is a current smoker does not drink alcohol does not use illicit drugs.  Labs in the ED shows serum creatinine 1.60 up from 1.25 at baseline.  CT abdomen and pelvis showed large volume ascites enlarging left pleural effusion and thickening of the stomach which is suspected for metastasis diffuse skeletal metastasis.  IR was consulted patient underwent paracentesis 6.7 L of clear yellow fluid removed.  She tolerated procedure well.  Patient was seen and examined in the ED.

## 2020-12-27 NOTE — Plan of Care (Signed)

## 2020-12-27 NOTE — Progress Notes (Signed)
PROGRESS NOTE    Carolyn Barron  JSE:[id number] DOB: Dec 02, 1960 DOA: 12/25/2020 PCP: [name], DO    Brief Narrative:  This 60 years old female with PMH significant for breast cancer on chemotherapy, hypertension, vertigo presented in the ED with chief complaints of abdominal distention.  Patient reports that every 2 to 3 weeks she has to have a paracentesis.  She denies any history of liver disease.  Patient is a current smoker,  does not drink alcohol , does not use illicit drugs.  Labs in the ED shows serum creatinine 1.60 up from 1.25 at baseline.  CT abdomen and pelvis showed large volume ascites,  enlarging left pleural effusion and thickening of the stomach which is suspected for metastasis diffuse skeletal metastasis.  IR was consulted,  patient underwent paracentesis 6.7 L of clear yellow fluid removed.  She tolerated procedure well.  Assessment & Plan:   Principal Problem:   AKI (acute kidney injury) ([place]) Active Problems:   Ascites   Pleural effusion   Anemia   Severe protein-calorie malnutrition ([place])  Acute kidney injury : This could be sec. to chemotherapy medications. Serum creatinine 1.25 at baseline.  Presents with creatinine 1.60 Avoid nephrotoxic medications, continue to monitor.  Ascites: This could be secondary to metastatic breast cancer. Patient reports every 2 to 3 weeks she has to have a paracentesis. She denies history of liver disease, denies alcohol. IR consulted she had a successful paracentesis. 6.7 L of clear yellow fluid removed.  Left pleural effusion: Patient never had a thoracocentesis before. ED physician has spoken with general surgery. Since this is the first time recommended thoracocentesis. She might require Pleurx catheter if she continues to have recurrent pleural effusion  Severe protein calorie malnutrition Albumin 1.9, patient able to tolerate p.o. Add protein shakes continue to monitor  Chronic normocytic normocytic  anemia. Baseline Hb is between 9 -10. Continue to monitor hemoglobin. She denies any obvious bleeding.    DVT prophylaxis:  SCDs Code Status:  Full code. Family Communication:  No family at bed side. Disposition Plan:   Status is: Inpatient  Remains inpatient appropriate because:Inpatient level of care appropriate due to severity of illness  Dispo: The patient is from: Home              Anticipated d/c is to: Home              Patient currently is not medically stable to d/c.   Difficult to place patient No          Consultants:  IR  Procedures:  Paracentesis Antimicrobials:   Anti-infectives (From admission, onward)    None        Subjective: Patient was seen and examined at bedside.  Overnight events noted.  Patient reports feeling much improved after paracentesis.  Objective: Vitals:   12/26/20 2118 12/27/20 0500 12/27/20 0621 12/27/20 1417  BP:   110/64 (!) 101/57  Pulse: (!) 103  93 92  Resp:   18 16  Temp:   98.2 F (36.8 C) 97.6 F (36.4 C)  TempSrc:   Oral Oral  SpO2:   97% 96%  Weight:  58.9 kg    Height:        Intake/Output Summary (Last 24 hours) at 12/27/2020 1453 Last data filed at 12/27/2020 0614 Gross per 24 hour  Intake --  Output 200 ml  Net -200 ml   Filed Weights   12/26/20 1757 12/27/20 0500  Weight: 63.4 kg 58.9 kg  Examination:  General exam: Appears calm and comfortable , not in any acute distress. Respiratory system: Clear to auscultation. Respiratory effort normal. Cardiovascular system: S1 & S2 heard, RRR. No JVD, murmurs, rubs, gallops or clicks. No pedal edema. Gastrointestinal system: Abdomen is nondistended, soft and nontender. No organomegaly or masses felt. Normal bowel sounds heard. Central nervous system: Alert and oriented. No focal neurological deficits. Extremities: Symmetric 5 x 5 power. No Edema, no cyanosis, no clubbing. Skin: No rashes, lesions or ulcers Psychiatry: Judgement and insight appear  normal. Mood & affect appropriate.     Data Reviewed: I have personally reviewed following labs and imaging studies  CBC: Recent Labs  Lab 12/25/20 1235 12/26/20 0430 12/26/20 0914  WBC 6.9 6.5  --   HGB 8.9* 7.7* 8.0*  HCT 29.1* 25.0* 26.2*  MCV 87.1 87.7  --   PLT 456* 389  --    Basic Metabolic Panel: Recent Labs  Lab 12/25/20 1235 12/26/20 0430  NA 136 135  K 4.5 4.0  CL 105 104  CO2 24 23  GLUCOSE 95 84  BUN 24* 25*  CREATININE 1.60* 1.61*  CALCIUM 8.2* 8.0*  MG  --  2.3   GFR: Estimated Creatinine Clearance: 29 mL/min (A) (by C-G formula based on SCr of 1.61 mg/dL (H)). Liver Function Tests: Recent Labs  Lab 12/25/20 1235 12/26/20 0430  AST 25 23  ALT 12 12  ALKPHOS 202* 172*  BILITOT 0.5 0.4  PROT 6.0* 5.4*  ALBUMIN 1.9* 2.0*   Recent Labs  Lab 12/25/20 1235  LIPASE 45   Recent Labs  Lab 12/25/20 1436  AMMONIA 16   Coagulation Profile: Recent Labs  Lab 12/26/20 0430  INR 1.2   Cardiac Enzymes: No results for input(s): CKTOTAL, CKMB, CKMBINDEX, TROPONINI in the last 168 hours. BNP (last 3 results) No results for input(s): PROBNP in the last 8760 hours. HbA1C: No results for input(s): HGBA1C in the last 72 hours. CBG: No results for input(s): GLUCAP in the last 168 hours. Lipid Profile: No results for input(s): CHOL, HDL, LDLCALC, TRIG, CHOLHDL, LDLDIRECT in the last 72 hours. Thyroid Function Tests: No results for input(s): TSH, T4TOTAL, FREET4, T3FREE, THYROIDAB in the last 72 hours. Anemia Panel: No results for input(s): VITAMINB12, FOLATE, FERRITIN, TIBC, IRON, RETICCTPCT in the last 72 hours. Sepsis Labs: No results for input(s): PROCALCITON, LATICACIDVEN in the last 168 hours.  Recent Results (from the past 240 hour(s))  Resp Panel by RT-PCR (Flu A&B, Covid) Nasopharyngeal Swab     Status: None   Collection Time: 12/25/20  2:37 PM   Specimen: Nasopharyngeal Swab; Nasopharyngeal(NP) swabs in vial transport medium  Result  Value Ref Range Status   SARS Coronavirus 2 by RT PCR NEGATIVE NEGATIVE Final    Comment: (NOTE) SARS-CoV-2 target nucleic acids are NOT DETECTED.  The SARS-CoV-2 RNA is generally detectable in upper respiratory specimens during the acute phase of infection. The lowest concentration of SARS-CoV-2 viral copies this assay can detect is 138 copies/mL. A negative result does not preclude SARS-Cov-2 infection and should not be used as the sole basis for treatment or other patient management decisions. A negative result may occur with  improper specimen collection/handling, submission of specimen other than nasopharyngeal swab, presence of viral mutation(s) within the areas targeted by this assay, and inadequate number of viral copies(<138 copies/mL). A negative result must be combined with clinical observations, patient history, and epidemiological information. The expected result is Negative.  Fact Sheet for Patients:  EntrepreneurPulse.com.au  Fact Sheet for Healthcare Providers:  IncredibleEmployment.be  This test is no t yet approved or cleared by the [place] FDA and  has been authorized for detection and/or diagnosis of SARS-CoV-2 by FDA under an Emergency Use Authorization (EUA). This EUA will remain  in effect (meaning this test can be used) for the duration of the COVID-19 declaration under Section 564(b)(1) of the Act, 21 U.S.C.section 360bbb-3(b)(1), unless the authorization is terminated  or revoked sooner.       Influenza A by PCR NEGATIVE NEGATIVE Final   Influenza B by PCR NEGATIVE NEGATIVE Final    Comment: (NOTE) The Xpert Xpress SARS-CoV-2/FLU/RSV plus assay is intended as an aid in the diagnosis of influenza from Nasopharyngeal swab specimens and should not be used as a sole basis for treatment. Nasal washings and aspirates are unacceptable for Xpert Xpress SARS-CoV-2/FLU/RSV testing.  Fact Sheet for  Patients: EntrepreneurPulse.com.au  Fact Sheet for Healthcare Providers: IncredibleEmployment.be  This test is not yet approved or cleared by the [place] FDA and has been authorized for detection and/or diagnosis of SARS-CoV-2 by FDA under an Emergency Use Authorization (EUA). This EUA will remain in effect (meaning this test can be used) for the duration of the COVID-19 declaration under Section 564(b)(1) of the Act, 21 U.S.C. section 360bbb-3(b)(1), unless the authorization is terminated or revoked.  Performed at [facility], [place] [address], [place], [place] [address]   Body fluid culture w Gram Stain     Status: None (Preliminary result)   Collection Time: 12/26/20 12:43 PM   Specimen: PATH Cytology Peritoneal fluid  Result Value Ref Range Status   Specimen Description   Final    PERITONEAL Performed at [place] [address] [place], [place] [address]    Special Requests   Final    NONE Performed at [facility], [place] [address] [place], [place] [address]    Gram Stain PENDING  Incomplete   Culture   Final    NO GROWTH < 12 HOURS Performed at [facility], [place] [address] [place], [place] [address]    Report Status PENDING  Incomplete     Radiology Studies: CT Abdomen Pelvis Wo Contrast  Result Date: 12/25/2020 CLINICAL DATA:  Abdominal distension in a 60 year old female. History of breast cancer with peritoneal disease based on recent imaging. EXAM: CT ABDOMEN AND PELVIS WITHOUT CONTRAST TECHNIQUE: Multidetector CT imaging of the abdomen and pelvis was performed following the standard protocol without IV contrast. COMPARISON:  November 14, 2020. FINDINGS: Lower chest: Enlarging LEFT-sided pleural effusion as compared to recent imaging. Signs of coronary artery disease and aortic valvular calcification. LEFT-sided effusion is moderately large previously small to  moderate. Hepatobiliary: No gross hepatic abnormality with similar smooth contours of the liver. Gallbladder is nondistended. Some cholelithiasis is suspected versus sludge layering dependently. Pancreas: Pancreas with similar contours.  No signs of inflammation. Spleen: Spleen normal size and contour. Adrenals/Urinary Tract: Adrenal glands are normal. Smooth renal contours. No hydronephrosis. Urinary bladder is collapsed amidst large volume abdominal ascites. Nephrolithiasis in the lower pole of the LEFT kidney. Mild fullness of LEFT intrarenal collecting systems is similar to the previous exam without frank hydronephrosis, this involves the lower pole of the LEFT kidney. Stomach/Bowel: Marked gastric thickening. Bowel displaced by ascites into the central abdomen. No pneumatosis. No bowel obstruction. Appendix is normal. Vascular/Lymphatic: Calcified atheromatous plaque of the abdominal aorta without aneurysmal dilation. Upper abdominal adenopathy is suspected adjacent to the  pancreatic head in the porta hepatis. This was seen on previous imaging not as well assessed on current examination. Estimate stability of these areas. LEFT retroperitoneal adenopathy (image 32/2) 1.6 cm short axis previously approximately 1.5 cm. Reproductive: Masslike appearance of the RIGHT ovary, soft tissue measuring approximately 6.3 cm greatest axial dimension along the RIGHT pelvic sidewall grossly stable compared to previous imaging. No LEFT adnexal mass. No pelvic lymphadenopathy. Cm Other: Large volume ascites may be increased since the previous study. Distance between abdominal wall in liver increased from 2.83.9 cm. There was quite a large volume of ascites on the prior exam as well. No pneumoperitoneum. Marked abdominal distension with grossly similar appearance. Musculoskeletal: Spinal degenerative changes and signs of diffuse skeletal metastatic disease with similar appearance. IMPRESSION: 1. Large volume ascites may be  slightly increased since the previous study. 2. Enlarging LEFT-sided pleural effusion. 3. Signs of peritoneal disease and adenopathy with similar appearance. 4. Diffuse marked thickening of the stomach suspicious for metastatic involvement. 5. Nephrolithiasis. 6. 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 7. Aortic atherosclerosis. Aortic Atherosclerosis (ICD10-I70.0). Electronically Signed   By: Zetta Bills M.D.   On: 12/25/2020 16:47   DG Chest 2 View  Result Date: 12/25/2020 CLINICAL DATA:  Shortness of breath EXAM: CHEST - 2 VIEW COMPARISON:  October 11, 2012 FINDINGS: There is mild right base atelectasis with right pleural effusion. Lungs elsewhere clear. Heart size and pulmonary vascularity are normal. No adenopathy. Patient is status post left mastectomy with surgical clips in the left axillary region. IMPRESSION: Right pleural effusion with right base atelectasis. Lungs otherwise clear. Heart size and pulmonary vascularity normal. Status post left mastectomy. Electronically Signed   By: Lowella Grip III M.D.   On: 12/25/2020 15:31   US Paracentesis  Result Date: 12/26/2020 Theresa Duty, NP     12/26/2020  1:03 PM PROCEDURE SUMMARY: Successful US guided paracentesis from right abdomen Yielded 6.7 L of clear yellow fluid. No immediate complications. Pt tolerated well. Specimen sent for labs. EBL < 1 mL Theresa Duty, NP 12/26/2020 1:02 PM     Scheduled Meds:  atorvastatin  20 mg Oral Daily   cholecalciferol  1,000 Units Oral Daily   famotidine  20 mg Oral Daily   feeding supplement  237 mL Oral BID BM   PARoxetine  40 mg Oral [id number]   Continuous Infusions:  albumin human     methocarbamol (ROBAXIN) IV       LOS: 1 day    Time spent: 35 mins    Shawna Clamp, MD [facility]   If 7PM-7AM, please contact night-coverage

## 2020-12-27 NOTE — Plan of Care (Signed)
  Problem: Health Behavior/Discharge Planning: Goal: Ability to manage health-related needs will improve Outcome: Progressing   Problem: Clinical Measurements: Goal: Will remain free from infection Outcome: Progressing Goal: Diagnostic test results will improve Outcome: Progressing Goal: Respiratory complications will improve Outcome: Progressing Goal: Cardiovascular complication will be avoided Outcome: Progressing   Problem: Activity: Goal: Risk for activity intolerance will decrease Outcome: Progressing   Problem: Nutrition: Goal: Adequate nutrition will be maintained Outcome: Progressing   Problem: Coping: Goal: Level of anxiety will decrease Outcome: Progressing   Problem: Elimination: Goal: Will not experience complications related to bowel motility Outcome: Progressing Goal: Will not experience complications related to urinary retention Outcome: Progressing   Problem: Pain Managment: Goal: General experience of comfort will improve Outcome: Progressing   Problem: Safety: Goal: Ability to remain free from injury will improve Outcome: Progressing   Problem: Skin Integrity: Goal: Risk for impaired skin integrity will decrease Outcome: Progressing   

## 2020-12-27 NOTE — Plan of Care (Signed)
  Problem: Education: Goal: Knowledge of General Education information will improve Description: Including pain rating scale, medication(s)/side effects and non-pharmacologic comfort measures Outcome: Adequate for Discharge   Problem: Clinical Measurements: Goal: Ability to maintain clinical measurements within normal limits will improve Outcome: Adequate for Discharge   

## 2020-12-28 ENCOUNTER — Inpatient Hospital Stay (HOSPITAL_COMMUNITY): Payer: 59

## 2020-12-28 LAB — CBC
HCT: 26.1 % — ABNORMAL LOW (ref 36.0–46.0)
Hemoglobin: 7.7 g/dL — ABNORMAL LOW (ref 12.0–15.0)
MCH: 26.7 pg (ref 26.0–34.0)
MCHC: 29.5 g/dL — ABNORMAL LOW (ref 30.0–36.0)
MCV: 90.6 fL (ref 80.0–100.0)
Platelets: 322 10*3/uL (ref 150–400)
RBC: 2.88 MIL/uL — ABNORMAL LOW (ref 3.87–5.11)
RDW: 17.7 % — ABNORMAL HIGH (ref 11.5–15.5)
WBC: 4.9 10*3/uL (ref 4.0–10.5)
nRBC: 0 % (ref 0.0–0.2)

## 2020-12-28 LAB — MAGNESIUM: Magnesium: 2 mg/dL (ref 1.7–2.4)

## 2020-12-28 LAB — PHOSPHORUS: Phosphorus: 4 mg/dL (ref 2.5–4.6)

## 2020-12-28 LAB — BASIC METABOLIC PANEL
Anion gap: 6 (ref 5–15)
BUN: 28 mg/dL — ABNORMAL HIGH (ref 8–23)
CO2: 22 mmol/L (ref 22–32)
Calcium: 7.9 mg/dL — ABNORMAL LOW (ref 8.9–10.3)
Chloride: 105 mmol/L (ref 98–111)
Creatinine, Ser: 1.59 mg/dL — ABNORMAL HIGH (ref 0.44–1.00)
GFR, Estimated: 37 mL/min — ABNORMAL LOW (ref >60)
Glucose, Bld: 89 mg/dL (ref 70–99)
Potassium: 4.1 mmol/L (ref 3.5–5.1)
Sodium: 133 mmol/L — ABNORMAL LOW (ref 135–145)

## 2020-12-28 NOTE — Progress Notes (Signed)
YELLOW MEWS, pt become SOB with some exertion when moving around. Increase respiration and increased edema noted in mid abdominal region. Will continue to monitor. SRP, RN

## 2020-12-28 NOTE — Progress Notes (Signed)
PROGRESS NOTE    Carolyn Barron  OFB:[id number] DOB: 1961/03/25 DOA: 12/25/2020 PCP: [name], DO    Brief Narrative:  This 60 years old female with PMH significant for breast cancer on chemotherapy, hypertension, vertigo presented in the ED with chief complaints of abdominal distention.  Patient reports that every 2 to 3 weeks she has to have a paracentesis.  She denies any history of liver disease.  Patient is a current smoker,  does not drink alcohol , does not use illicit drugs.  Labs in the ED shows serum creatinine 1.60 up from 1.25 at baseline.  CT abdomen and pelvis showed large volume ascites,  enlarging left pleural effusion and thickening of the stomach which is suspected for metastasis, diffuse skeletal metastasis.  IR was consulted,  patient underwent paracentesis 6.7 L of clear yellow fluid removed.  She tolerated procedure well.  Assessment & Plan:   Principal Problem:   AKI (acute kidney injury) ([place]) Active Problems:   Ascites   Pleural effusion   Anemia   Depression   GERD (gastroesophageal reflux disease)   Adnexal mass   Malignant neoplasm of overlapping sites of left breast in female, estrogen receptor positive ([place])   Severe protein-calorie malnutrition ([place])  Acute kidney injury : This could be sec. to chemotherapy medications. Serum creatinine 1.25 at baseline.  Presents with creatinine 1.60 Avoid nephrotoxic medications, continue to monitor.  Ascites: This could be secondary to metastatic breast cancer. Patient reports every 2 to 3 weeks she has to have a paracentesis. She denies history of liver disease, denies alcohol. IR consulted,  she had a successful paracentesis. 6.7 L of clear yellow fluid removed.  Left pleural effusion: Patient never had a thoracocentesis before. ED physician has spoken with general surgery. Since this is the first time recommended thoracocentesis. She might require Pleurx catheter if she continues to have recurrent pleural  effusion.  Severe protein calorie malnutrition Albumin 1.9, patient able to tolerate p.o. Add protein shakes continue to monitor  Chronic normocytic normocytic anemia. Baseline Hb is between 9 -10. Continue to monitor hemoglobin. She denies any obvious bleeding. Hb remains stable @ 7.7    DVT prophylaxis:  SCDs Code Status:  Full code. Family Communication:  No family at bed side. Disposition Plan:   Status is: Inpatient  Remains inpatient appropriate because:Inpatient level of care appropriate due to severity of illness  Dispo: The patient is from: Home              Anticipated d/c is to: Home              Patient currently is not medically stable to d/c.   Difficult to place patient No  Consultants:  IR  Procedures:  Paracentesis Antimicrobials:   Anti-infectives (From admission, onward)    None        Subjective: Patient was seen and examined at bedside.  Overnight events noted.   Patient reports feeling much improved after paracentesis. She denies any pain.  She is alert awake fully oriented.  Objective: Vitals:   12/28/20 0500 12/28/20 1100 12/28/20 1152 12/28/20 1212  BP:   (!) 100/59 100/61  Pulse:   95 94  Resp:  (!) 22 (!) 24 20  Temp:   (!) 97.5 F (36.4 C) 99.1 F (37.3 C)  TempSrc:   Oral   SpO2:   99%   Weight: 60.4 kg     Height:        Intake/Output Summary (Last 24 hours)  at 12/28/2020 1423 Last data filed at 12/27/2020 1500 Gross per 24 hour  Intake 480 ml  Output --  Net 480 ml   Filed Weights   12/26/20 1757 12/27/20 0500 12/28/20 0500  Weight: 63.4 kg 58.9 kg 60.4 kg    Examination:  General exam: Appears calm and comfortable , not in any acute distress. Respiratory system: Clear to auscultation. Respiratory effort normal. Cardiovascular system: S1 & S2 heard, RRR. No JVD, murmurs, rubs, gallops or clicks. No pedal edema. Gastrointestinal system: Abdomen is nondistended, soft and nontender. No organomegaly or masses felt.  Normal bowel sounds heard. Central nervous system: Alert and oriented. No focal neurological deficits. Extremities: Symmetric 5 x 5 power. No Edema, no cyanosis, no clubbing. Skin: No rashes, lesions or ulcers Psychiatry: Judgement and insight appear normal. Mood & affect appropriate.     Data Reviewed: I have personally reviewed following labs and imaging studies  CBC: Recent Labs  Lab 12/25/20 1235 12/26/20 0430 12/26/20 0914 12/28/20 0434  WBC 6.9 6.5  --  4.9  HGB 8.9* 7.7* 8.0* 7.7*  HCT 29.1* 25.0* 26.2* 26.1*  MCV 87.1 87.7  --  90.6  PLT 456* 389  --  962   Basic Metabolic Panel: Recent Labs  Lab 12/25/20 1235 12/26/20 0430 12/28/20 0434  NA 136 135 133*  K 4.5 4.0 4.1  CL 105 104 105  CO2 24 23 22   GLUCOSE 95 84 89  BUN 24* 25* 28*  CREATININE 1.60* 1.61* 1.59*  CALCIUM 8.2* 8.0* 7.9*  MG  --  2.3 2.0  PHOS  --   --  4.0   GFR: Estimated Creatinine Clearance: 31.8 mL/min (A) (by C-G formula based on SCr of 1.59 mg/dL (H)). Liver Function Tests: Recent Labs  Lab 12/25/20 1235 12/26/20 0430  AST 25 23  ALT 12 12  ALKPHOS 202* 172*  BILITOT 0.5 0.4  PROT 6.0* 5.4*  ALBUMIN 1.9* 2.0*   Recent Labs  Lab 12/25/20 1235  LIPASE 45   Recent Labs  Lab 12/25/20 1436  AMMONIA 16   Coagulation Profile: Recent Labs  Lab 12/26/20 0430  INR 1.2   Cardiac Enzymes: No results for input(s): CKTOTAL, CKMB, CKMBINDEX, TROPONINI in the last 168 hours. BNP (last 3 results) No results for input(s): PROBNP in the last 8760 hours. HbA1C: No results for input(s): HGBA1C in the last 72 hours. CBG: No results for input(s): GLUCAP in the last 168 hours. Lipid Profile: No results for input(s): CHOL, HDL, LDLCALC, TRIG, CHOLHDL, LDLDIRECT in the last 72 hours. Thyroid Function Tests: No results for input(s): TSH, T4TOTAL, FREET4, T3FREE, THYROIDAB in the last 72 hours. Anemia Panel: No results for input(s): VITAMINB12, FOLATE, FERRITIN, TIBC, IRON,  RETICCTPCT in the last 72 hours. Sepsis Labs: No results for input(s): PROCALCITON, LATICACIDVEN in the last 168 hours.  Recent Results (from the past 240 hour(s))  Resp Panel by RT-PCR (Flu A&B, Covid) Nasopharyngeal Swab     Status: None   Collection Time: 12/25/20  2:37 PM   Specimen: Nasopharyngeal Swab; Nasopharyngeal(NP) swabs in vial transport medium  Result Value Ref Range Status   SARS Coronavirus 2 by RT PCR NEGATIVE NEGATIVE Final    Comment: (NOTE) SARS-CoV-2 target nucleic acids are NOT DETECTED.  The SARS-CoV-2 RNA is generally detectable in upper respiratory specimens during the acute phase of infection. The lowest concentration of SARS-CoV-2 viral copies this assay can detect is 138 copies/mL. A negative result does not preclude SARS-Cov-2 infection and should not be  used as the sole basis for treatment or other patient management decisions. A negative result may occur with  improper specimen collection/handling, submission of specimen other than nasopharyngeal swab, presence of viral mutation(s) within the areas targeted by this assay, and inadequate number of viral copies(<138 copies/mL). A negative result must be combined with clinical observations, patient history, and epidemiological information. The expected result is Negative.  Fact Sheet for Patients:  EntrepreneurPulse.com.au  Fact Sheet for Healthcare Providers:  IncredibleEmployment.be  This test is no t yet approved or cleared by the [place] FDA and  has been authorized for detection and/or diagnosis of SARS-CoV-2 by FDA under an Emergency Use Authorization (EUA). This EUA will remain  in effect (meaning this test can be used) for the duration of the COVID-19 declaration under Section 564(b)(1) of the Act, 21 U.S.C.section 360bbb-3(b)(1), unless the authorization is terminated  or revoked sooner.       Influenza A by PCR NEGATIVE NEGATIVE Final   Influenza  B by PCR NEGATIVE NEGATIVE Final    Comment: (NOTE) The Xpert Xpress SARS-CoV-2/FLU/RSV plus assay is intended as an aid in the diagnosis of influenza from Nasopharyngeal swab specimens and should not be used as a sole basis for treatment. Nasal washings and aspirates are unacceptable for Xpert Xpress SARS-CoV-2/FLU/RSV testing.  Fact Sheet for Patients: EntrepreneurPulse.com.au  Fact Sheet for Healthcare Providers: IncredibleEmployment.be  This test is not yet approved or cleared by the [place] FDA and has been authorized for detection and/or diagnosis of SARS-CoV-2 by FDA under an Emergency Use Authorization (EUA). This EUA will remain in effect (meaning this test can be used) for the duration of the COVID-19 declaration under Section 564(b)(1) of the Act, 21 U.S.C. section 360bbb-3(b)(1), unless the authorization is terminated or revoked.  Performed at [facility], [place] [address] [place], [place] [address]   Body fluid culture w Gram Stain     Status: None (Preliminary result)   Collection Time: 12/26/20 12:43 PM   Specimen: PATH Cytology Peritoneal fluid  Result Value Ref Range Status   Specimen Description   Final    PERITONEAL Performed at [place] [address], [place], [place] [address]    Special Requests   Final    NONE Performed at [facility], [place] [address], [place], [place] [address]    Gram Stain NO WBC SEEN NO ORGANISMS SEEN   Final   Culture   Final    NO GROWTH 2 DAYS Performed at [facility], [place] [address], [place], [place] [address]    Report Status PENDING  Incomplete     Radiology Studies: DG CHEST PORT 1 VIEW  Result Date: 12/28/2020 CLINICAL DATA:  Breast cancer on chemotherapy. Hypertension. Vertigo. Recurrent paracenteses. EXAM: PORTABLE CHEST 1 VIEW COMPARISON:  12/25/2020 FINDINGS: Numerous leads and wires project over the  chest. Left axillary node dissection. Left mastectomy. Midline trachea. Normal heart size and mediastinal contours. No pneumothorax. Small right pleural effusion is likely similar. Adjacent right greater than left base atelectasis. IMPRESSION: Similar small right pleural effusion with bibasilar atelectasis. Electronically Signed   By: Abigail Miyamoto M.D.   On: 12/28/2020 13:38     Scheduled Meds:  atorvastatin  20 mg Oral Daily   cholecalciferol  1,000 Units Oral Daily   famotidine  20 mg Oral Daily   feeding supplement  237 mL Oral BID BM   PARoxetine  40 mg Oral BH-q7a   Continuous Infusions:  albumin human     methocarbamol (ROBAXIN) IV       LOS: 2 days    Time spent: 25 mins    Shawna Clamp, MD [facility]   If 7PM-7AM, please contact night-coverage

## 2020-12-28 NOTE — Plan of Care (Signed)
  Problem: Health Behavior/Discharge Planning: Goal: Ability to manage health-related needs will improve Outcome: Progressing   Problem: Clinical Measurements: Goal: Will remain free from infection Outcome: Progressing Goal: Diagnostic test results will improve Outcome: Progressing   

## 2020-12-29 ENCOUNTER — Other Ambulatory Visit: Payer: Self-pay | Admitting: Student

## 2020-12-29 ENCOUNTER — Inpatient Hospital Stay (HOSPITAL_COMMUNITY): Payer: 59

## 2020-12-29 LAB — LACTATE DEHYDROGENASE, PLEURAL OR PERITONEAL FLUID: LD, Fluid: 84 U/L — ABNORMAL HIGH (ref 3–23)

## 2020-12-29 LAB — BODY FLUID CELL COUNT WITH DIFFERENTIAL
Eos, Fluid: 0 %
Lymphs, Fluid: 65 %
Monocyte-Macrophage-Serous Fluid: 29 % — ABNORMAL LOW (ref 50–90)
Neutrophil Count, Fluid: 6 % (ref 0–25)
Other Cells, Fluid: ABNORMAL %
Total Nucleated Cell Count, Fluid: 1512 cu mm — ABNORMAL HIGH (ref 0–1000)

## 2020-12-29 LAB — BASIC METABOLIC PANEL
Anion gap: 5 (ref 5–15)
BUN: 26 mg/dL — ABNORMAL HIGH (ref 8–23)
CO2: 23 mmol/L (ref 22–32)
Calcium: 7.6 mg/dL — ABNORMAL LOW (ref 8.9–10.3)
Chloride: 103 mmol/L (ref 98–111)
Creatinine, Ser: 1.37 mg/dL — ABNORMAL HIGH (ref 0.44–1.00)
GFR, Estimated: 44 mL/min — ABNORMAL LOW (ref >60)
Glucose, Bld: 96 mg/dL (ref 70–99)
Potassium: 4.3 mmol/L (ref 3.5–5.1)
Sodium: 131 mmol/L — ABNORMAL LOW (ref 135–145)

## 2020-12-29 LAB — CBC
HCT: 26.4 % — ABNORMAL LOW (ref 36.0–46.0)
Hemoglobin: 8.1 g/dL — ABNORMAL LOW (ref 12.0–15.0)
MCH: 26.9 pg (ref 26.0–34.0)
MCHC: 30.7 g/dL (ref 30.0–36.0)
MCV: 87.7 fL (ref 80.0–100.0)
Platelets: 313 10*3/uL (ref 150–400)
RBC: 3.01 MIL/uL — ABNORMAL LOW (ref 3.87–5.11)
RDW: 17.8 % — ABNORMAL HIGH (ref 11.5–15.5)
WBC: 4.8 10*3/uL (ref 4.0–10.5)
nRBC: 0 % (ref 0.0–0.2)

## 2020-12-29 LAB — PHOSPHORUS: Phosphorus: 3.9 mg/dL (ref 2.5–4.6)

## 2020-12-29 LAB — CYTOLOGY - NON PAP

## 2020-12-29 LAB — MAGNESIUM: Magnesium: 1.9 mg/dL (ref 1.7–2.4)

## 2020-12-29 LAB — AMYLASE, PLEURAL OR PERITONEAL FLUID: Amylase, Fluid: 43 U/L

## 2020-12-29 MED ORDER — LIDOCAINE HCL 1 % IJ SOLN
INTRAMUSCULAR | Status: AC
  Filled 2020-12-29: qty 20

## 2020-12-29 MED ORDER — LOPERAMIDE HCL 2 MG PO CAPS
2.0000 mg | ORAL_CAPSULE | Freq: Two times a day (BID) | ORAL | Status: DC | PRN
  Administered 2020-12-29: 2 mg via ORAL
  Filled 2020-12-29: qty 1

## 2020-12-29 MED ORDER — HYDROCORTISONE (PERIANAL) 2.5 % EX CREA
1.0000 "application " | TOPICAL_CREAM | Freq: Four times a day (QID) | CUTANEOUS | Status: DC | PRN
  Administered 2020-12-29: 1 via TOPICAL
  Filled 2020-12-29: qty 28.35

## 2020-12-29 NOTE — Plan of Care (Signed)
  Problem: Activity: Goal: Risk for activity intolerance will decrease Outcome: Progressing   Problem: Elimination: Goal: Will not experience complications related to bowel motility Outcome: Progressing Goal: Will not experience complications related to urinary retention Outcome: Progressing   Problem: Pain Managment: Goal: General experience of comfort will improve Outcome: Progressing   

## 2020-12-29 NOTE — Progress Notes (Signed)
PROGRESS NOTE    CHYNA KNEECE  PYP:[id number] DOB: 1960-12-30 DOA: 12/25/2020 PCP: [name], DO    Brief Narrative:  This 60 years old female with PMH significant for breast cancer on chemotherapy, hypertension, vertigo presented in the ED with chief complaints of abdominal distention.  Patient reports that every 2 to 3 weeks she has to have a paracentesis.  She denies any history of liver disease.  Patient is a current smoker,  does not drink alcohol , does not use illicit drugs.  Labs in the ED shows serum creatinine 1.60 up from 1.25 at baseline.  CT abdomen and pelvis showed large volume ascites,  enlarging left pleural effusion and thickening of the stomach which is suspected for metastasis, diffuse skeletal metastasis.  IR was consulted,  patient underwent paracentesis 6.7 L of clear yellow fluid removed.  she underwent left thoracocentesis 06/13 . She tolerated procedure well.  Assessment & Plan:   Principal Problem:   AKI (acute kidney injury) ([place]) Active Problems:   Ascites   Pleural effusion   Anemia   Depression   GERD (gastroesophageal reflux disease)   Adnexal mass   Malignant neoplasm of overlapping sites of left breast in female, estrogen receptor positive ([place])   Severe protein-calorie malnutrition ([place])  Acute kidney injury : > Improving This could be sec. to chemotherapy medications. Serum creatinine 1.25 at baseline.  Presents with creatinine 1.60 Avoid nephrotoxic medications, continue to monitor. Creatinine  1.37 today.  Ascites: This could be secondary to metastatic breast cancer. Patient reports every 2 to 3 weeks she has to have a paracentesis. She denies history of liver disease, denies alcohol. IR consulted,  she had a successful paracentesis. 6.7 L of clear yellow fluid removed.  Left pleural effusion: Patient never had a thoracocentesis before. ED physician has spoken with general surgery. Since this is the first time recommended  thoracocentesis. She might require Pleurx catheter if she continues to have recurrent pleural effusion. S/p left thoracocentesis 600 mL of clear fluid drained.  Severe protein calorie malnutrition Albumin 1.9, patient able to tolerate p.o. Add protein shakes continue to monitor  Chronic normocytic normocytic anemia. Baseline Hb is between 9 -10. Continue to monitor hemoglobin. She denies any obvious bleeding. Hb remains stable @ 7.7 > 8.1    DVT prophylaxis:  SCDs Code Status:  Full code. Family Communication:  No family at bed side. Disposition Plan:   Status is: Inpatient  Remains inpatient appropriate because:Inpatient level of care appropriate due to severity of illness  Dispo: The patient is from: Home              Anticipated d/c is to: Home in 1-2 days              Patient currently is not medically stable to d/c.   Difficult to place patient No  Consultants:  IR  Procedures:  Paracentesis Antimicrobials:   Anti-infectives (From admission, onward)    None        Subjective: Patient was seen and examined at bedside.  Overnight events noted.   Patient reports feeling much improved after paracentesis. She denies any pain.  She is alert awake,  fully oriented. She is s/p left thoracocentesis, tolerated well.  She feels better and wants to be discharged.  Objective: Vitals:   12/29/20 0531 12/29/20 1100 12/29/20 1215 12/29/20 1227  BP: 113/65 121/85 121/69 123/76  Pulse: 97     Resp: 18     Temp: 98.1 F (36.7 C)  TempSrc: Oral     SpO2: 100%     Weight: 59.3 kg     Height:        Intake/Output Summary (Last 24 hours) at 12/29/2020 1451 Last data filed at 12/29/2020 0900 Gross per 24 hour  Intake 240 ml  Output --  Net 240 ml    Filed Weights   12/27/20 0500 12/28/20 0500 12/29/20 0531  Weight: 58.9 kg 60.4 kg 59.3 kg    Examination:  General exam: Appears calm and comfortable , not in any acute distress. Respiratory system: Clear to  auscultation. Respiratory effort normal. Cardiovascular system: S1 & S2 heard, RRR. No JVD, murmurs, rubs, gallops or clicks. No pedal edema. Gastrointestinal system: Abdomen is nondistended, soft and nontender. No organomegaly or masses felt. Normal bowel sounds heard. Central nervous system: Alert and oriented. No focal neurological deficits. Extremities: Symmetric 5 x 5 power. No Edema, no cyanosis, no clubbing. Skin: No rashes, lesions or ulcers Psychiatry: Judgement and insight appear normal. Mood & affect appropriate.     Data Reviewed: I have personally reviewed following labs and imaging studies  CBC: Recent Labs  Lab 12/25/20 1235 12/26/20 0430 12/26/20 0914 12/28/20 0434 12/29/20 0417  WBC 6.9 6.5  --  4.9 4.8  HGB 8.9* 7.7* 8.0* 7.7* 8.1*  HCT 29.1* 25.0* 26.2* 26.1* 26.4*  MCV 87.1 87.7  --  90.6 87.7  PLT 456* 389  --  322 962    Basic Metabolic Panel: Recent Labs  Lab 12/25/20 1235 12/26/20 0430 12/28/20 0434 12/29/20 0417  NA 136 135 133* 131*  K 4.5 4.0 4.1 4.3  CL 105 104 105 103  CO2 24 23 22 23   GLUCOSE 95 84 89 96  BUN 24* 25* 28* 26*  CREATININE 1.60* 1.61* 1.59* 1.37*  CALCIUM 8.2* 8.0* 7.9* 7.6*  MG  --  2.3 2.0 1.9  PHOS  --   --  4.0 3.9    GFR: Estimated Creatinine Clearance: 34.1 mL/min (A) (by C-G formula based on SCr of 1.37 mg/dL (H)). Liver Function Tests: Recent Labs  Lab 12/25/20 1235 12/26/20 0430  AST 25 23  ALT 12 12  ALKPHOS 202* 172*  BILITOT 0.5 0.4  PROT 6.0* 5.4*  ALBUMIN 1.9* 2.0*    Recent Labs  Lab 12/25/20 1235  LIPASE 45    Recent Labs  Lab 12/25/20 1436  AMMONIA 16    Coagulation Profile: Recent Labs  Lab 12/26/20 0430  INR 1.2    Cardiac Enzymes: No results for input(s): CKTOTAL, CKMB, CKMBINDEX, TROPONINI in the last 168 hours. BNP (last 3 results) No results for input(s): PROBNP in the last 8760 hours. HbA1C: No results for input(s): HGBA1C in the last 72 hours. CBG: No results for  input(s): GLUCAP in the last 168 hours. Lipid Profile: No results for input(s): CHOL, HDL, LDLCALC, TRIG, CHOLHDL, LDLDIRECT in the last 72 hours. Thyroid Function Tests: No results for input(s): TSH, T4TOTAL, FREET4, T3FREE, THYROIDAB in the last 72 hours. Anemia Panel: No results for input(s): VITAMINB12, FOLATE, FERRITIN, TIBC, IRON, RETICCTPCT in the last 72 hours. Sepsis Labs: No results for input(s): PROCALCITON, LATICACIDVEN in the last 168 hours.  Recent Results (from the past 240 hour(s))  Resp Panel by RT-PCR (Flu A&B, Covid) Nasopharyngeal Swab     Status: None   Collection Time: 12/25/20  2:37 PM   Specimen: Nasopharyngeal Swab; Nasopharyngeal(NP) swabs in vial transport medium  Result Value Ref Range Status   SARS Coronavirus 2 by RT PCR  NEGATIVE NEGATIVE Final    Comment: (NOTE) SARS-CoV-2 target nucleic acids are NOT DETECTED.  The SARS-CoV-2 RNA is generally detectable in upper respiratory specimens during the acute phase of infection. The lowest concentration of SARS-CoV-2 viral copies this assay can detect is 138 copies/mL. A negative result does not preclude SARS-Cov-2 infection and should not be used as the sole basis for treatment or other patient management decisions. A negative result may occur with  improper specimen collection/handling, submission of specimen other than nasopharyngeal swab, presence of viral mutation(s) within the areas targeted by this assay, and inadequate number of viral copies(<138 copies/mL). A negative result must be combined with clinical observations, patient history, and epidemiological information. The expected result is Negative.  Fact Sheet for Patients:  EntrepreneurPulse.com.au  Fact Sheet for Healthcare Providers:  IncredibleEmployment.be  This test is no t yet approved or cleared by the [place] FDA and  has been authorized for detection and/or diagnosis of SARS-CoV-2 by FDA under  an Emergency Use Authorization (EUA). This EUA will remain  in effect (meaning this test can be used) for the duration of the COVID-19 declaration under Section 564(b)(1) of the Act, 21 U.S.C.section 360bbb-3(b)(1), unless the authorization is terminated  or revoked sooner.       Influenza A by PCR NEGATIVE NEGATIVE Final   Influenza B by PCR NEGATIVE NEGATIVE Final    Comment: (NOTE) The Xpert Xpress SARS-CoV-2/FLU/RSV plus assay is intended as an aid in the diagnosis of influenza from Nasopharyngeal swab specimens and should not be used as a sole basis for treatment. Nasal washings and aspirates are unacceptable for Xpert Xpress SARS-CoV-2/FLU/RSV testing.  Fact Sheet for Patients: EntrepreneurPulse.com.au  Fact Sheet for Healthcare Providers: IncredibleEmployment.be  This test is not yet approved or cleared by the [place] FDA and has been authorized for detection and/or diagnosis of SARS-CoV-2 by FDA under an Emergency Use Authorization (EUA). This EUA will remain in effect (meaning this test can be used) for the duration of the COVID-19 declaration under Section 564(b)(1) of the Act, 21 U.S.C. section 360bbb-3(b)(1), unless the authorization is terminated or revoked.  Performed at [facility], [place] [address], [place], [place] [address]   Body fluid culture w Gram Stain     Status: None (Preliminary result)   Collection Time: 12/26/20 12:43 PM   Specimen: PATH Cytology Peritoneal fluid  Result Value Ref Range Status   Specimen Description   Final    PERITONEAL Performed at [place] [address], [place], [place] [address]    Special Requests   Final    NONE Performed at [facility], [place] [address] [place], [place] [address]    Gram Stain NO WBC SEEN NO ORGANISMS SEEN   Final   Culture   Final    NO GROWTH 3 DAYS Performed at [facility], [place]  [address], [place], [place] [address]    Report Status PENDING  Incomplete      Radiology Studies: DG Chest 1 View  Result Date: 12/29/2020 CLINICAL DATA:  Status post thoracentesis EXAM: CHEST  1 VIEW COMPARISON:  December 28, 2020 FINDINGS: No pneumothorax. There is no appreciable pleural effusion on the left. There is a small right pleural effusion with right base atelectasis. Lungs otherwise are clear. Heart size and pulmonary vascularity are normal. No adenopathy. There are surgical clips in the left axilla. Patient is status post left mastectomy. IMPRESSION: No pneumothorax. Left lung now clear. Small right  pleural effusion with right base atelectasis. Right lung otherwise clear. Heart size normal. Status post mastectomy on the left. Electronically Signed   By: Lowella Grip III M.D.   On: 12/29/2020 12:49   DG CHEST PORT 1 VIEW  Result Date: 12/28/2020 CLINICAL DATA:  Breast cancer on chemotherapy. Hypertension. Vertigo. Recurrent paracenteses. EXAM: PORTABLE CHEST 1 VIEW COMPARISON:  12/25/2020 FINDINGS: Numerous leads and wires project over the chest. Left axillary node dissection. Left mastectomy. Midline trachea. Normal heart size and mediastinal contours. No pneumothorax. Small right pleural effusion is likely similar. Adjacent right greater than left base atelectasis. IMPRESSION: Similar small right pleural effusion with bibasilar atelectasis. Electronically Signed   By: Abigail Miyamoto M.D.   On: 12/28/2020 13:38   US THORACENTESIS ASP PLEURAL SPACE W/IMG GUIDE  Result Date: 12/29/2020 INDICATION: Patient with history of breast cancer, recurrent ascites, dyspnea, and bilateral pleural effusions, left > right. Request is made for diagnostic and therapeutic left thoracentesis. EXAM: ULTRASOUND GUIDED DIAGNOSTIC AND THERAPEUTIC LEFT THORACENTESIS MEDICATIONS: 10 mL 1% lidocaine COMPLICATIONS: None immediate. PROCEDURE: An ultrasound guided thoracentesis was thoroughly discussed with the patient and  questions answered. The benefits, risks, alternatives and complications were also discussed. The patient understands and wishes to proceed with the procedure. Written consent was obtained. Ultrasound was performed to localize and mark an adequate pocket of fluid in the left chest. The area was then prepped and draped in the normal sterile fashion. 1% Lidocaine was used for local anesthesia. Under ultrasound guidance a 6 Fr Safe-T-Centesis catheter was introduced. Thoracentesis was performed. The catheter was removed and a dressing applied. FINDINGS: A total of approximately 600 mL of serosanguineous fluid was removed. Samples were sent to the laboratory as requested by the clinical team. IMPRESSION: Successful ultrasound guided left thoracentesis yielding 600 mL of pleural fluid. Read by: Earley Abide, PA-C Electronically Signed   By: Miachel Roux M.D.   On: 12/29/2020 13:54     Scheduled Meds:  atorvastatin  20 mg Oral Daily   cholecalciferol  1,000 Units Oral Daily   famotidine  20 mg Oral Daily   feeding supplement  237 mL Oral BID BM   PARoxetine  40 mg Oral BH-q7a   Continuous Infusions:  albumin human     methocarbamol (ROBAXIN) IV       LOS: 3 days    Time spent: 25 mins    Shawna Clamp, MD [facility]   If 7PM-7AM, please contact night-coverage

## 2020-12-29 NOTE — Procedures (Signed)
PROCEDURE SUMMARY:  Successful image-guided left thoracentesis. Yielded 600 milliliters of serosanguinous fluid. Patient tolerated procedure well. No immediate complications. EBL = 0 mL.  Specimen was sent for labs. CXR ordered.  Please see imaging section of Epic for full dictation.   Claris Pong Lilyan Prete PA-C 12/29/2020 1:23 PM

## 2020-12-30 LAB — BODY FLUID CULTURE W GRAM STAIN
Culture: NO GROWTH
Gram Stain: NONE SEEN

## 2020-12-30 LAB — PATHOLOGIST SMEAR REVIEW

## 2020-12-30 LAB — BASIC METABOLIC PANEL
Anion gap: 6 (ref 5–15)
BUN: 24 mg/dL — ABNORMAL HIGH (ref 8–23)
CO2: 24 mmol/L (ref 22–32)
Calcium: 7.9 mg/dL — ABNORMAL LOW (ref 8.9–10.3)
Chloride: 103 mmol/L (ref 98–111)
Creatinine, Ser: 1.24 mg/dL — ABNORMAL HIGH (ref 0.44–1.00)
GFR, Estimated: 50 mL/min — ABNORMAL LOW (ref >60)
Glucose, Bld: 92 mg/dL (ref 70–99)
Potassium: 4.5 mmol/L (ref 3.5–5.1)
Sodium: 133 mmol/L — ABNORMAL LOW (ref 135–145)

## 2020-12-30 LAB — CBC
HCT: 30.4 % — ABNORMAL LOW (ref 36.0–46.0)
Hemoglobin: 9 g/dL — ABNORMAL LOW (ref 12.0–15.0)
MCH: 26.1 pg (ref 26.0–34.0)
MCHC: 29.6 g/dL — ABNORMAL LOW (ref 30.0–36.0)
MCV: 88.1 fL (ref 80.0–100.0)
Platelets: 323 10*3/uL (ref 150–400)
RBC: 3.45 MIL/uL — ABNORMAL LOW (ref 3.87–5.11)
RDW: 18 % — ABNORMAL HIGH (ref 11.5–15.5)
WBC: 4.7 10*3/uL (ref 4.0–10.5)
nRBC: 0.4 % — ABNORMAL HIGH (ref 0.0–0.2)

## 2020-12-30 LAB — PHOSPHORUS: Phosphorus: 4.2 mg/dL (ref 2.5–4.6)

## 2020-12-30 LAB — MAGNESIUM: Magnesium: 2.2 mg/dL (ref 1.7–2.4)

## 2020-12-30 NOTE — Discharge Summary (Signed)
Physician Discharge Summary  Carolyn Barron:[id number] DOB: May 02, 1961 DOA: 12/25/2020  PCP: [name], DO  Admit date: 12/25/2020 Discharge date: 12/30/2020  Admitted From: Home  Disposition: Home   Recommendations for Outpatient Follow-up:  Follow up with PCP in 1-2 weeks Please obtain BMP/CBC in one week Please follow up on the following pending results: Final culture results from pleural fluids, Cytology results from pleural effusion.  Needs goals of care discussion.   Home Health: None  Discharge Condition; Stable.  CODE STATUS: Full code Diet recommendation: Heart Healthy.    Brief/Interim Summary by Dr Dwyane Dee:  This 60 years old female with PMH significant for breast cancer on chemotherapy, hypertension, vertigo presented in the ED with chief complaints of abdominal distention.  Patient reports that every 2 to 3 weeks she has to have a paracentesis.  She denies any history of liver disease.  Patient is a current smoker,  does not drink alcohol , does not use illicit drugs.  Labs in the ED shows serum creatinine 1.60 up from 1.25 at baseline.  CT abdomen and pelvis showed large volume ascites,  enlarging left pleural effusion and thickening of the stomach which is suspected for metastasis, diffuse skeletal metastasis.  IR was consulted,  patient underwent paracentesis 6.7 L of clear yellow fluid removed.  she underwent left thoracocentesis 06/13 . She tolerated procedure well.   Acute kidney injury : This could be sec. to chemotherapy medications. Serum creatinine 1.25 at baseline.  Presents with creatinine 1.60 Avoid nephrotoxic medications, continue to monitor. Creatinine  1.2. Improved.    Ascites: This could be secondary to metastatic breast cancer. Patient reports every 2 to 3 weeks she has to have a paracentesis. She denies history of liver disease, denies alcohol. IR consulted,  she had a successful paracentesis. 6.7 L of clear yellow fluid removed. Peritoneal  fluid no growth to date.    Left pleural effusion: Patient never had a thoracocentesis before. She might require Pleurx catheter if she continues to have recurrent pleural effusion. S/p left thoracocentesis 600 mL of clear fluid drained. Pleural fluid WBC 1500. No growth to date. Cytology pending.  No evidence of infection on X ray, no fevers, no leukocytosis.   Severe protein calorie malnutrition Albumin 1.9, patient able to tolerate p.o. On Ensure.    Chronic normocytic normocytic anemia. Baseline Hb is between 9 -10. Continue to monitor hemoglobin. She denies any obvious bleeding. Hb remains stable @ 7.7 > 8.1      Discharge Diagnoses:  Principal Problem:   AKI (acute kidney injury) ([place]) Active Problems:   Ascites   Pleural effusion   Anemia   Depression   GERD (gastroesophageal reflux disease)   Adnexal mass   Malignant neoplasm of overlapping sites of left breast in female, estrogen receptor positive ([place])   Severe protein-calorie malnutrition ([place])    Discharge Instructions  Discharge Instructions     Diet - low sodium heart healthy   Complete by: As directed    Increase activity slowly   Complete by: As directed       Allergies as of 12/30/2020       Reactions   Clindamycin/lincomycin Rash        Medication List     TAKE these medications    abemaciclib 150 MG tablet Commonly known as: Verzenio Take 1 tablet (150 mg total) by mouth 2 (two) times daily. Swallow tablets whole. Do not chew, crush, or split tablets before swallowing. Start 12/16/2020   acetaminophen  500 MG tablet Commonly known as: TYLENOL Take 500 mg by mouth every 6 (six) hours as needed for moderate pain.   albuterol 108 (90 Base) MCG/ACT inhaler Commonly known as: VENTOLIN HFA Inhale 2 puffs into the lungs every 6 (six) hours as needed for wheezing or shortness of breath.   anastrozole 1 MG tablet Commonly known as: ARIMIDEX Take 1 tablet (1 mg total) by mouth daily.    atorvastatin 20 MG tablet Commonly known as: LIPITOR Take 20 mg by mouth daily.   cholecalciferol 25 MCG (1000 UNIT) tablet Commonly known as: VITAMIN D3 Take 1,000 Units by mouth daily.   famotidine 20 MG tablet Commonly known as: PEPCID Take 20 mg by mouth daily.   pantoprazole 20 MG tablet Commonly known as: PROTONIX Take 20 mg by mouth daily.   PARoxetine 40 MG tablet Commonly known as: PAXIL Take 40 mg by mouth every morning.        Follow-up Information     [name], DO. Call in 1 week(s).   Specialty: Family Medicine Contact information: [address] [place] [address] [phone]         Magrinat, Virgie Dad, MD. Call in 1 week(s).   Specialty: Oncology Contact information: [place] [address] [phone]                Allergies  Allergen Reactions   Clindamycin/Lincomycin Rash    Consultations: None   Procedures/Studies: CT Abdomen Pelvis Wo Contrast  Result Date: 12/25/2020 CLINICAL DATA:  Abdominal distension in a 60 year old female. History of breast cancer with peritoneal disease based on recent imaging. EXAM: CT ABDOMEN AND PELVIS WITHOUT CONTRAST TECHNIQUE: Multidetector CT imaging of the abdomen and pelvis was performed following the standard protocol without IV contrast. COMPARISON:  November 14, 2020. FINDINGS: Lower chest: Enlarging LEFT-sided pleural effusion as compared to recent imaging. Signs of coronary artery disease and aortic valvular calcification. LEFT-sided effusion is moderately large previously small to moderate. Hepatobiliary: No gross hepatic abnormality with similar smooth contours of the liver. Gallbladder is nondistended. Some cholelithiasis is suspected versus sludge layering dependently. Pancreas: Pancreas with similar contours.  No signs of inflammation. Spleen: Spleen normal size and contour. Adrenals/Urinary Tract: Adrenal glands are normal. Smooth renal contours.  No hydronephrosis. Urinary bladder is collapsed amidst large volume abdominal ascites. Nephrolithiasis in the lower pole of the LEFT kidney. Mild fullness of LEFT intrarenal collecting systems is similar to the previous exam without frank hydronephrosis, this involves the lower pole of the LEFT kidney. Stomach/Bowel: Marked gastric thickening. Bowel displaced by ascites into the central abdomen. No pneumatosis. No bowel obstruction. Appendix is normal. Vascular/Lymphatic: Calcified atheromatous plaque of the abdominal aorta without aneurysmal dilation. Upper abdominal adenopathy is suspected adjacent to the pancreatic head in the porta hepatis. This was seen on previous imaging not as well assessed on current examination. Estimate stability of these areas. LEFT retroperitoneal adenopathy (image 32/2) 1.6 cm short axis previously approximately 1.5 cm. Reproductive: Masslike appearance of the RIGHT ovary, soft tissue measuring approximately 6.3 cm greatest axial dimension along the RIGHT pelvic sidewall grossly stable compared to previous imaging. No LEFT adnexal mass. No pelvic lymphadenopathy. Cm Other: Large volume ascites may be increased since the previous study. Distance between abdominal wall in liver increased from 2.83.9 cm. There was quite a large volume of ascites on the prior exam as well. No pneumoperitoneum. Marked abdominal distension with grossly similar appearance. Musculoskeletal: Spinal degenerative changes and signs of diffuse skeletal  metastatic disease with similar appearance. IMPRESSION: 1. Large volume ascites may be slightly increased since the previous study. 2. Enlarging LEFT-sided pleural effusion. 3. Signs of peritoneal disease and adenopathy with similar appearance. 4. Diffuse marked thickening of the stomach suspicious for metastatic involvement. 5. Nephrolithiasis. 6. 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 7. Aortic atherosclerosis. Aortic Atherosclerosis  (ICD10-I70.0). Electronically Signed   By: Zetta Bills M.D.   On: 12/25/2020 16:47   DG Chest 1 View  Result Date: 12/29/2020 CLINICAL DATA:  Status post thoracentesis EXAM: CHEST  1 VIEW COMPARISON:  December 28, 2020 FINDINGS: No pneumothorax. There is no appreciable pleural effusion on the left. There is a small right pleural effusion with right base atelectasis. Lungs otherwise are clear. Heart size and pulmonary vascularity are normal. No adenopathy. There are surgical clips in the left axilla. Patient is status post left mastectomy. IMPRESSION: No pneumothorax. Left lung now clear. Small right pleural effusion with right base atelectasis. Right lung otherwise clear. Heart size normal. Status post mastectomy on the left. Electronically Signed   By: Lowella Grip III M.D.   On: 12/29/2020 12:49   DG Chest 2 View  Result Date: 12/25/2020 CLINICAL DATA:  Shortness of breath EXAM: CHEST - 2 VIEW COMPARISON:  October 11, 2012 FINDINGS: There is mild right base atelectasis with right pleural effusion. Lungs elsewhere clear. Heart size and pulmonary vascularity are normal. No adenopathy. Patient is status post left mastectomy with surgical clips in the left axillary region. IMPRESSION: Right pleural effusion with right base atelectasis. Lungs otherwise clear. Heart size and pulmonary vascularity normal. Status post left mastectomy. Electronically Signed   By: Lowella Grip III M.D.   On: 12/25/2020 15:31   US Paracentesis  Result Date: 12/26/2020 INDICATION: Patient with a history of metastatic breast cancer and recurrent ascites presents today for therapeutic and diagnostic paracentesis. EXAM: ULTRASOUND GUIDED PARACENTESIS MEDICATIONS: 1% lidocaine 10 mL COMPLICATIONS: None immediate. PROCEDURE: Informed written consent was obtained from the patient after a discussion of the risks, benefits and alternatives to treatment. A timeout was performed prior to the initiation of the procedure. Initial  ultrasound scanning demonstrates a large amount of ascites within the right lower abdominal quadrant. The right lower abdomen was prepped and draped in the usual sterile fashion. 1% lidocaine was used for local anesthesia. Following this, a 19 gauge, 7-cm, Yueh catheter was introduced. An ultrasound image was saved for documentation purposes. The paracentesis was performed. The catheter was removed and a dressing was applied. The patient tolerated the procedure well without immediate post procedural complication. FINDINGS: A total of approximately 6.7 L of clear yellow fluid was removed. Samples were sent to the laboratory as requested by the clinical team. IMPRESSION: Successful ultrasound-guided paracentesis yielding 6.7 liters of peritoneal fluid. Read by: Soyla Dryer, NP Electronically Signed   By: Aletta Edouard M.D.   On: 12/26/2020 13:00   DG CHEST PORT 1 VIEW  Result Date: 12/28/2020 CLINICAL DATA:  Breast cancer on chemotherapy. Hypertension. Vertigo. Recurrent paracenteses. EXAM: PORTABLE CHEST 1 VIEW COMPARISON:  12/25/2020 FINDINGS: Numerous leads and wires project over the chest. Left axillary node dissection. Left mastectomy. Midline trachea. Normal heart size and mediastinal contours. No pneumothorax. Small right pleural effusion is likely similar. Adjacent right greater than left base atelectasis. IMPRESSION: Similar small right pleural effusion with bibasilar atelectasis. Electronically Signed   By: Abigail Miyamoto M.D.   On: 12/28/2020 13:38   CT ABDOMINAL MASS BIOPSY  Result Date: 12/08/2020 INDICATION: History of  breast cancer and concern for ovarian cancer. 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 A total of Versed 2.0 mg and Fentanyl 100 mcg was administered intravenously. Moderate Sedation Time: 11 minutes. The patient's level of  consciousness and vital signs were monitored continuously by radiology nursing throughout the procedure under my direct supervision. FLUOROSCOPY TIME:  Fluoroscopy Time: None. COMPLICATIONS: None immediate. PROCEDURE: Informed written consent was obtained from the patient after a thorough discussion of the procedural risks, benefits and alternatives. All questions were addressed. Maximal Sterile Barrier Technique was utilized including caps, mask, sterile gowns, sterile gloves, sterile drape, hand hygiene and skin antiseptic. A timeout was performed prior to the initiation of the procedure. Previous imaging reviewed. Patient positioned supine. Noncontrast localization CT performed. The anterior omental nodule mass was localized and marked for a right anterior oblique approach. Under sterile conditions local anesthesia, CT guidance was utilized to advance a 17 gauge guide needle into the anterior omental nodular mass. Needle position confirmed with CT. 18 gauge core biopsies obtained. Samples placed in formalin. These were intact and non fragmented. Needle removed. Postprocedure imaging demonstrates no hemorrhage or hematoma. Patient tolerated biopsy well. IMPRESSION: Successful CT-guided anterior omental mass 18 gauge core biopsies Electronically Signed   By: Jerilynn Mages.  Shick M.D.   On: 12/08/2020 11:07   IR Paracentesis  Result Date: 12/08/2020 INDICATION: Patient with history of metastatic breast cancer, recurrent ascites. Request received for therapeutic paracentesis. EXAM: ULTRASOUND GUIDED THERAPEUTIC PARACENTESIS MEDICATIONS: 1% lidocaine to skin and subcutaneous tissue COMPLICATIONS: None immediate. PROCEDURE: Informed written consent was obtained from the patient after a discussion of the risks, benefits and alternatives to treatment. A timeout was performed prior to the initiation of the procedure. Initial ultrasound scanning demonstrates a large amount of ascites within the left lower abdominal quadrant. The  left lower abdomen was prepped and draped in the usual sterile fashion. 1% lidocaine was used for local anesthesia. Following this, a 19 gauge, 7-cm, Yueh catheter was introduced. An ultrasound image was saved for documentation purposes. The paracentesis was performed. The catheter was removed and a dressing was applied. The patient tolerated the procedure well without immediate post procedural complication. FINDINGS: A total of approximately 6.9 liters of yellow fluid was removed. IMPRESSION: Successful ultrasound-guided therapeutic paracentesis yielding 6.9 liters of peritoneal fluid. Read by: Rowe Robert, PA-C Electronically Signed   By: Jerilynn Mages.  Shick M.D.   On: 12/08/2020 09:54   US THORACENTESIS ASP PLEURAL SPACE W/IMG GUIDE  Result Date: 12/29/2020 INDICATION: Patient with history of breast cancer, recurrent ascites, dyspnea, and bilateral pleural effusions, left > right. Request is made for diagnostic and therapeutic left thoracentesis. EXAM: ULTRASOUND GUIDED DIAGNOSTIC AND THERAPEUTIC LEFT THORACENTESIS MEDICATIONS: 10 mL 1% lidocaine COMPLICATIONS: None immediate. PROCEDURE: An ultrasound guided thoracentesis was thoroughly discussed with the patient and questions answered. The benefits, risks, alternatives and complications were also discussed. The patient understands and wishes to proceed with the procedure. Written consent was obtained. Ultrasound was performed to localize and mark an adequate pocket of fluid in the left chest. The area was then prepped and draped in the normal sterile fashion. 1% Lidocaine was used for local anesthesia. Under ultrasound guidance a 6 Fr Safe-T-Centesis catheter was introduced. Thoracentesis was performed. The catheter was removed and a dressing applied. FINDINGS: A total of approximately 600 mL of serosanguineous fluid was removed. Samples were sent to the laboratory as requested by the clinical team.  IMPRESSION: Successful ultrasound guided left thoracentesis yielding  600 mL of pleural fluid. Read by: Earley Abide, PA-C Electronically Signed   By: Miachel Roux M.D.   On: 12/29/2020 13:54     Subjective: She is feeling better, well, denies dyspnea.   Discharge Exam: Vitals:   12/29/20 2135 12/30/20 0533  BP: 115/67 112/64  Pulse: 97 92  Resp: 19 16  Temp: 98.8 F (37.1 C) 98.1 F (36.7 C)  SpO2: 98% 100%     General: Pt is alert, awake, not in acute distress Cardiovascular: RRR, S1/S2 +, no rubs, no gallops Respiratory: CTA bilaterally, no wheezing, no rhonchi Abdominal: Soft, NT, ND, bowel sounds + Extremities: no edema, no cyanosis    The results of significant diagnostics from this hospitalization (including imaging, microbiology, ancillary and laboratory) are listed below for reference.     Microbiology: Recent Results (from the past 240 hour(s))  Resp Panel by RT-PCR (Flu A&B, Covid) Nasopharyngeal Swab     Status: None   Collection Time: 12/25/20  2:37 PM   Specimen: Nasopharyngeal Swab; Nasopharyngeal(NP) swabs in vial transport medium  Result Value Ref Range Status   SARS Coronavirus 2 by RT PCR NEGATIVE NEGATIVE Final    Comment: (NOTE) SARS-CoV-2 target nucleic acids are NOT DETECTED.  The SARS-CoV-2 RNA is generally detectable in upper respiratory specimens during the acute phase of infection. The lowest concentration of SARS-CoV-2 viral copies this assay can detect is 138 copies/mL. A negative result does not preclude SARS-Cov-2 infection and should not be used as the sole basis for treatment or other patient management decisions. A negative result may occur with  improper specimen collection/handling, submission of specimen other than nasopharyngeal swab, presence of viral mutation(s) within the areas targeted by this assay, and inadequate number of viral copies(<138 copies/mL). A negative result must be combined with clinical observations, patient history, and epidemiological information. The expected result is  Negative.  Fact Sheet for Patients:  EntrepreneurPulse.com.au  Fact Sheet for Healthcare Providers:  IncredibleEmployment.be  This test is no t yet approved or cleared by the [place] FDA and  has been authorized for detection and/or diagnosis of SARS-CoV-2 by FDA under an Emergency Use Authorization (EUA). This EUA will remain  in effect (meaning this test can be used) for the duration of the COVID-19 declaration under Section 564(b)(1) of the Act, 21 U.S.C.section 360bbb-3(b)(1), unless the authorization is terminated  or revoked sooner.       Influenza A by PCR NEGATIVE NEGATIVE Final   Influenza B by PCR NEGATIVE NEGATIVE Final    Comment: (NOTE) The Xpert Xpress SARS-CoV-2/FLU/RSV plus assay is intended as an aid in the diagnosis of influenza from Nasopharyngeal swab specimens and should not be used as a sole basis for treatment. Nasal washings and aspirates are unacceptable for Xpert Xpress SARS-CoV-2/FLU/RSV testing.  Fact Sheet for Patients: EntrepreneurPulse.com.au  Fact Sheet for Healthcare Providers: IncredibleEmployment.be  This test is not yet approved or cleared by the [place] FDA and has been authorized for detection and/or diagnosis of SARS-CoV-2 by FDA under an Emergency Use Authorization (EUA). This EUA will remain in effect (meaning this test can be used) for the duration of the COVID-19 declaration under Section 564(b)(1) of the Act, 21 U.S.C. section 360bbb-3(b)(1), unless the authorization is terminated or revoked.  Performed at [facility] [place] [address], [place],  [address]   Body fluid culture w Gram Stain     Status: None   Collection Time: 12/26/20 12:43  PM   Specimen: PATH Cytology Peritoneal fluid  Result Value Ref Range Status   Specimen Description   Final    PERITONEAL Performed at [facility]  [address] [place], [place] [address]    Special Requests   Final    NONE Performed at [facility], [place] [address], [place], [place] [address]    Gram Stain NO WBC SEEN NO ORGANISMS SEEN   Final   Culture   Final    NO GROWTH 3 DAYS Performed at [facility], [place] [address], [place], [place] [address]    Report Status 12/30/2020 FINAL  Final  Body fluid culture w Gram Stain     Status: None (Preliminary result)   Collection Time: 12/29/20 12:38 PM   Specimen: PATH Cytology Pleural fluid  Result Value Ref Range Status   Specimen Description   Final    PLEURAL LEFT Performed at [facility] [address], [place], [place] [address]    Special Requests   Final    NONE Performed at [facility], [place] [address], [place], [place] [address]    Gram Stain   Final    MODERATE WBC PRESENT, PREDOMINANTLY MONONUCLEAR NO ORGANISMS SEEN    Culture   Final    NO GROWTH < 24 HOURS Performed at [facility] [address], [place], [place] [address]    Report Status PENDING  Incomplete     Labs: BNP (last 3 results) No results for input(s): BNP in the last 8760 hours. Basic Metabolic Panel: Recent Labs  Lab 12/25/20 1235 12/26/20 0430 12/28/20 0434 12/29/20 0417 12/30/20 0347  NA 136 135 133* 131* 133*  K 4.5 4.0 4.1 4.3 4.5  CL 105 104 105 103 103  CO2 24 23 22 23 24   GLUCOSE 95 84 89 96 92  BUN 24* 25* 28* 26* 24*  CREATININE 1.60* 1.61* 1.59* 1.37* 1.24*  CALCIUM 8.2* 8.0* 7.9* 7.6* 7.9*  MG  --  2.3 2.0 1.9 2.2  PHOS  --   --  4.0 3.9 4.2   Liver Function Tests: Recent Labs  Lab 12/25/20 1235 12/26/20 0430  AST 25 23  ALT 12 12  ALKPHOS 202* 172*  BILITOT 0.5 0.4  PROT 6.0* 5.4*  ALBUMIN 1.9* 2.0*   Recent Labs  Lab 12/25/20 1235  LIPASE 45   Recent Labs  Lab 12/25/20 1436  AMMONIA 16   CBC: Recent Labs  Lab 12/25/20 1235 12/26/20 0430 12/26/20 0914 12/28/20 0434  12/29/20 0417 12/30/20 0347  WBC 6.9 6.5  --  4.9 4.8 4.7  HGB 8.9* 7.7* 8.0* 7.7* 8.1* 9.0*  HCT 29.1* 25.0* 26.2* 26.1* 26.4* 30.4*  MCV 87.1 87.7  --  90.6 87.7 88.1  PLT 456* 389  --  322 313 323   Cardiac Enzymes: No results for input(s): CKTOTAL, CKMB, CKMBINDEX, TROPONINI in the last 168 hours. BNP: Invalid input(s): POCBNP CBG: No results for input(s): GLUCAP in the last 168 hours. D-Dimer No results for input(s): DDIMER in the last 72 hours. Hgb A1c No results for input(s): HGBA1C in the last 72 hours. Lipid Profile No results for input(s): CHOL, HDL, LDLCALC, TRIG, CHOLHDL, LDLDIRECT in the last 72 hours. Thyroid function studies No results for input(s): TSH, T4TOTAL, T3FREE, THYROIDAB in the last 72 hours.  Invalid input(s): FREET3 Anemia work up No results for input(s): VITAMINB12, FOLATE, FERRITIN, TIBC, IRON, RETICCTPCT in the last 72 hours.  Urinalysis    Component Value Date/Time   COLORURINE YELLOW 04/16/2012 1637   APPEARANCEUR CLOUDY (A) 04/16/2012 1637   LABSPEC 1.022 04/16/2012 1637   PHURINE 6.0 04/16/2012 1637   GLUCOSEU NEGATIVE 04/16/2012 1637   HGBUR NEGATIVE 04/16/2012 1637   BILIRUBINUR NEGATIVE 04/16/2012 1637   KETONESUR NEGATIVE 04/16/2012 1637   PROTEINUR NEGATIVE 04/16/2012 1637   UROBILINOGEN 0.2 04/16/2012 1637   NITRITE NEGATIVE 04/16/2012 1637   LEUKOCYTESUR NEGATIVE 04/16/2012 1637   Sepsis Labs Invalid input(s): PROCALCITONIN,  WBC,  LACTICIDVEN Microbiology Recent Results (from the past 240 hour(s))  Resp Panel by RT-PCR (Flu A&B, Covid) Nasopharyngeal Swab     Status: None   Collection Time: 12/25/20  2:37 PM   Specimen: Nasopharyngeal Swab; Nasopharyngeal(NP) swabs in vial transport medium  Result Value Ref Range Status   SARS Coronavirus 2 by RT PCR NEGATIVE NEGATIVE Final    Comment: (NOTE) SARS-CoV-2 target nucleic acids are NOT DETECTED.  The SARS-CoV-2 RNA is generally detectable in upper respiratory specimens  during the acute phase of infection. The lowest concentration of SARS-CoV-2 viral copies this assay can detect is 138 copies/mL. A negative result does not preclude SARS-Cov-2 infection and should not be used as the sole basis for treatment or other patient management decisions. A negative result may occur with  improper specimen collection/handling, submission of specimen other than nasopharyngeal swab, presence of viral mutation(s) within the areas targeted by this assay, and inadequate number of viral copies(<138 copies/mL). A negative result must be combined with clinical observations, patient history, and epidemiological information. The expected result is Negative.  Fact Sheet for Patients:  EntrepreneurPulse.com.au  Fact Sheet for Healthcare Providers:  IncredibleEmployment.be  This test is no t yet approved or cleared by the [place] FDA and  has been authorized for detection and/or diagnosis of SARS-CoV-2 by FDA under an Emergency Use Authorization (EUA). This EUA will remain  in effect (meaning this test can be used) for the duration of the COVID-19 declaration under Section 564(b)(1) of the Act, 21 U.S.C.section 360bbb-3(b)(1), unless the authorization is terminated  or revoked sooner.       Influenza A by PCR NEGATIVE NEGATIVE Final   Influenza B by PCR NEGATIVE NEGATIVE Final    Comment: (NOTE) The Xpert Xpress SARS-CoV-2/FLU/RSV plus assay is intended as an aid in the diagnosis of influenza from Nasopharyngeal swab specimens and should not be used as a sole basis for treatment. Nasal washings and aspirates are unacceptable for Xpert Xpress SARS-CoV-2/FLU/RSV testing.  Fact Sheet for Patients: EntrepreneurPulse.com.au  Fact Sheet for Healthcare Providers: IncredibleEmployment.be  This test is not yet approved or cleared by the [place] FDA and has been authorized for detection  and/or diagnosis of SARS-CoV-2 by FDA under an Emergency Use Authorization (EUA). This EUA will remain in effect (meaning this test can be used) for the duration of the COVID-19 declaration under Section 564(b)(1) of the Act, 21 U.S.C. section 360bbb-3(b)(1), unless the authorization is terminated or revoked.  Performed at [facility], [place] [address], [place], [place] [address]   Body fluid culture w Gram Stain     Status: None   Collection Time: 12/26/20 12:43 PM   Specimen: PATH Cytology Peritoneal fluid  Result Value Ref Range Status   Specimen Description   Final    PERITONEAL Performed at [facility] [address], [place], [place] [address]    Special Requests   Final    NONE Performed at [facility], [place] [address], [place], [place] [address]    Gram Stain NO WBC SEEN NO ORGANISMS SEEN   Final   Culture   Final    NO GROWTH 3 DAYS Performed at [facility], [place] [address], [place], [place] [address]    Report Status 12/30/2020 FINAL  Final  Body fluid culture w Gram Stain     Status: None (Preliminary result)   Collection Time: 12/29/20 12:38 PM   Specimen: PATH Cytology Pleural fluid  Result Value Ref Range Status   Specimen Description   Final    PLEURAL LEFT Performed at [facility] [address] [place], [place] [address]    Special Requests   Final    NONE Performed at [facility], [place] [address], [place], [place] [address]    Gram Stain   Final    MODERATE WBC PRESENT, PREDOMINANTLY MONONUCLEAR NO ORGANISMS SEEN    Culture   Final    NO GROWTH < 24 HOURS Performed at [facility] [address] [place], [place] [address]    Report Status PENDING  Incomplete     Time coordinating discharge: 40 minutes  SIGNED:   Elmarie Shiley, MD  [facility]

## 2020-12-30 NOTE — Plan of Care (Signed)
Discharge instructions reviewed with patient, questions answered, verbalized understanding.  MEdications returned to patient that had been stored in pharmacy.  Patient transported to main entrance of hospital to be taken home by brother, medications in her possession.

## 2020-12-30 NOTE — Plan of Care (Signed)
  Problem: Clinical Measurements: Goal: Diagnostic test results will improve Outcome: Progressing Goal: Respiratory complications will improve Outcome: Progressing   Problem: Activity: Goal: Risk for activity intolerance will decrease Outcome: Progressing   

## 2020-12-31 ENCOUNTER — Other Ambulatory Visit: Payer: Self-pay | Admitting: Oncology

## 2021-01-01 LAB — BODY FLUID CULTURE W GRAM STAIN: Culture: NO GROWTH

## 2021-01-01 LAB — CYTOLOGY - NON PAP

## 2021-01-06 ENCOUNTER — Telehealth: Payer: Self-pay | Admitting: *Deleted

## 2021-01-06 ENCOUNTER — Telehealth: Payer: Self-pay | Admitting: Oncology

## 2021-01-06 LAB — ACID FAST CULTURE WITH REFLEXED SENSITIVITIES (MYCOBACTERIA): Acid Fast Culture: NEGATIVE

## 2021-01-06 MED ORDER — PROCHLORPERAZINE MALEATE 5 MG PO TABS: 5.0000 mg | ORAL_TABLET | Freq: Four times a day (QID) | ORAL | 0 refills | Status: AC | PRN

## 2021-01-06 NOTE — Telephone Encounter (Signed)
This RN spoke with pt per her VM requesting something for nausea.  She states she had some vomiting but mild- able to rehydrate " just feeling nauseated "  Noted pt is on abemaciclib and anastrozole.  Discussed use of metoclopramide 5 mg for nausea.  Noted pt cancelled appts for visit today- this RN rescheduled to 6/23 with pt as well as for her to call in am if nausea not controlled for further recommendations and care.  Reiterated concern for communication for best outcome.  Pt verbalized understanding.

## 2021-01-06 NOTE — Telephone Encounter (Signed)
R/s appt per 6/21 sch msg. Pt aware.

## 2021-01-07 ENCOUNTER — Observation Stay (HOSPITAL_COMMUNITY)
Admission: EM | Admit: 2021-01-07 | Discharge: 2021-01-09 | Disposition: A | Discharge status: Home / Self Care | Payer: 59 | Attending: Internal Medicine | Admitting: Internal Medicine

## 2021-01-07 ENCOUNTER — Ambulatory Visit: Payer: 59 | Admitting: Radiation Oncology

## 2021-01-07 ENCOUNTER — Other Ambulatory Visit: Payer: Self-pay

## 2021-01-07 ENCOUNTER — Inpatient Hospital Stay: Payer: 59 | Admitting: Oncology

## 2021-01-07 ENCOUNTER — Emergency Department (HOSPITAL_COMMUNITY): Payer: 59

## 2021-01-07 ENCOUNTER — Inpatient Hospital Stay: Payer: 59

## 2021-01-07 DIAGNOSIS — Z170 Estrogen receptor positive status [ER+]: Secondary | ICD-10-CM

## 2021-01-07 DIAGNOSIS — I129 Hypertensive chronic kidney disease with stage 1 through stage 4 chronic kidney disease, or unspecified chronic kidney disease: Secondary | ICD-10-CM | POA: Insufficient documentation

## 2021-01-07 DIAGNOSIS — R180 Malignant ascites: Secondary | ICD-10-CM

## 2021-01-07 DIAGNOSIS — R0602 Shortness of breath: Secondary | ICD-10-CM | POA: Diagnosis present

## 2021-01-07 DIAGNOSIS — Z20822 Contact with and (suspected) exposure to covid-19: Secondary | ICD-10-CM | POA: Insufficient documentation

## 2021-01-07 DIAGNOSIS — Z853 Personal history of malignant neoplasm of breast: Secondary | ICD-10-CM | POA: Diagnosis not present

## 2021-01-07 DIAGNOSIS — R0902 Hypoxemia: Secondary | ICD-10-CM

## 2021-01-07 DIAGNOSIS — Z79899 Other long term (current) drug therapy: Secondary | ICD-10-CM | POA: Diagnosis not present

## 2021-01-07 DIAGNOSIS — N189 Chronic kidney disease, unspecified: Secondary | ICD-10-CM | POA: Diagnosis present

## 2021-01-07 DIAGNOSIS — R188 Other ascites: Secondary | ICD-10-CM | POA: Diagnosis present

## 2021-01-07 DIAGNOSIS — N183 Chronic kidney disease, stage 3 unspecified: Secondary | ICD-10-CM | POA: Insufficient documentation

## 2021-01-07 DIAGNOSIS — N179 Acute kidney failure, unspecified: Principal | ICD-10-CM | POA: Insufficient documentation

## 2021-01-07 DIAGNOSIS — C50812 Malignant neoplasm of overlapping sites of left female breast: Secondary | ICD-10-CM

## 2021-01-07 DIAGNOSIS — F17210 Nicotine dependence, cigarettes, uncomplicated: Secondary | ICD-10-CM | POA: Insufficient documentation

## 2021-01-07 LAB — COMPREHENSIVE METABOLIC PANEL
ALT: 22 U/L (ref 0–44)
AST: 40 U/L (ref 15–41)
Albumin: 1.7 g/dL — ABNORMAL LOW (ref 3.5–5.0)
Alkaline Phosphatase: 217 U/L — ABNORMAL HIGH (ref 38–126)
Anion gap: 9 (ref 5–15)
BUN: 25 mg/dL — ABNORMAL HIGH (ref 8–23)
CO2: 24 mmol/L (ref 22–32)
Calcium: 7.9 mg/dL — ABNORMAL LOW (ref 8.9–10.3)
Chloride: 100 mmol/L (ref 98–111)
Creatinine, Ser: 1.77 mg/dL — ABNORMAL HIGH (ref 0.44–1.00)
GFR, Estimated: 32 mL/min — ABNORMAL LOW (ref >60)
Glucose, Bld: 92 mg/dL (ref 70–99)
Potassium: 4 mmol/L (ref 3.5–5.1)
Sodium: 133 mmol/L — ABNORMAL LOW (ref 135–145)
Total Bilirubin: 0.1 mg/dL — ABNORMAL LOW (ref 0.3–1.2)
Total Protein: 5.5 g/dL — ABNORMAL LOW (ref 6.5–8.1)

## 2021-01-07 LAB — CBC WITH DIFFERENTIAL/PLATELET
Abs Immature Granulocytes: 0.02 10*3/uL (ref 0.00–0.07)
Basophils Absolute: 0 10*3/uL (ref 0.0–0.1)
Basophils Relative: 0 %
Eosinophils Absolute: 0 10*3/uL (ref 0.0–0.5)
Eosinophils Relative: 0 %
HCT: 26.2 % — ABNORMAL LOW (ref 36.0–46.0)
Hemoglobin: 7.9 g/dL — ABNORMAL LOW (ref 12.0–15.0)
Immature Granulocytes: 0 %
Lymphocytes Relative: 29 %
Lymphs Abs: 1.3 10*3/uL (ref 0.7–4.0)
MCH: 26.7 pg (ref 26.0–34.0)
MCHC: 30.2 g/dL (ref 30.0–36.0)
MCV: 88.5 fL (ref 80.0–100.0)
Monocytes Absolute: 0.2 10*3/uL (ref 0.1–1.0)
Monocytes Relative: 4 %
Neutro Abs: 3 10*3/uL (ref 1.7–7.7)
Neutrophils Relative %: 67 %
Platelets: 260 10*3/uL (ref 150–400)
RBC: 2.96 MIL/uL — ABNORMAL LOW (ref 3.87–5.11)
RDW: 19.7 % — ABNORMAL HIGH (ref 11.5–15.5)
WBC: 4.5 10*3/uL (ref 4.0–10.5)
nRBC: 0 % (ref 0.0–0.2)

## 2021-01-07 NOTE — ED Notes (Signed)
Patient able to walk around the room with 1 assist but c/o weakness and shortness of breath. HR jump to 120-130's while ambulating.

## 2021-01-07 NOTE — Progress Notes (Deleted)
ID: Carolyn Barron   DOB: [birth year]  MR#: [id number]  KWI#:[id number]  Patient Care Team: [name], DO as PCP - General (Family Medicine) Consuella Lose, MD as Consulting Physician (Neurosurgery) Eppie Gibson, MD as Consulting Physician (Radiation Oncology) Magrinat, Virgie Dad, MD as Consulting Physician (Oncology) OTHER MD:  CHIEF COMPLAINT: now metastatic cancer (s/p remote left mastectomy)  CURRENT TREATMENT:  anastrozole; palliative radiation   INTERVAL HISTORY: Carolyn Barron returns today for re-evaluation of her now metastatic breast cancer.   Since her last visit, she underwent biopsy of the abdominal mass on 12/08/2020. Pathology from the procedure [id number]) revealed: metastatic carcinoma, with morphology and immunophenotype consistent with metastatic mammary carcinoma  The patient completed palliative radiation treatment to the left periorbital mass under Dr. Isidore Moos on 12/05/2020.  She tolerated this generally well.  She was started on anastrozole on 11/20/2020.  There has been noted improvement in her tumor markers, and she was prescribed Abemaciclib at her most recent visit with Dr. Jana Hakim on 12/16/2020.  She was hospitalized from 12/26/2020 and underwent paracentesis for malignant recurrent ascites that yielded 6.7 L and left ultrasound guided thoracentesis on 6/13 that demonstrated 600 ML of pleural fluid. She felt better after having this fluid removed and was discharged the following day.      REVIEW OF SYSTEMS: Review of Systems  Constitutional:  Negative for appetite change, chills, fatigue, fever and unexpected weight change.  HENT:   Negative for hearing loss, lump/mass and trouble swallowing.   Eyes:  Negative for eye problems and icterus.  Respiratory:  Negative for chest tightness, cough and shortness of breath.   Cardiovascular:  Negative for chest pain, leg swelling and palpitations.  Gastrointestinal:  Negative for abdominal distention, abdominal pain,  constipation, diarrhea, nausea and vomiting.  Endocrine: Negative for hot flashes.  Genitourinary:  Negative for difficulty urinating.   Musculoskeletal:  Negative for arthralgias.  Skin:  Negative for itching and rash.  Neurological:  Negative for dizziness, extremity weakness, headaches and numbness.  Hematological:  Negative for adenopathy. Does not bruise/bleed easily.  Psychiatric/Behavioral:  Negative for depression. The patient is not nervous/anxious.      COVID 19 VACCINATION STATUS: Status post vaccine x2   BREAST CANCER HISTORY: From the original intake note:  Ms. Laux felt a lump in her left breast after a shower in late October. She brought it to Dr. Eugenio Hoes attention, and was set up for mammography November 1 at the [facility].  Dr. Sadie Haber was able to feel some thickening at 1 o'clock in the left breast about 5 cm from the left nipple, and by mammography there was a spiculated mass there corresponding to the palpable finding.  Ultrasound showed this to be irregular, and to measure approximately 1.6 cm.  The left axilla appeared normal.  Biopsy was performed the same day, and showed ([id number]) an invasive mammary carcinoma with lobular features, which was ER+ at 100%, PR+ at 99%, with a low proliferation marker at 10%, and HER2 negative with a ratio of 1.13.   With this information, the patient was referred to Dr. Brantley Stage and breast MRI was obtained November 3.  This showed a large area of non-mass enhancement within the upper-outer and upper-inner quadrants of the left breast, much larger than the abnormality found by physical examination, ultrasound or mammography. The question was then raised whether to proceed to biopsy of the edges of this mass to ascertain for respectability, or to proceed directly to mastectomy, and the patient much preferred the  latter, so mastectomy was performed November 12 with sentinel lymph node biopsy, the final pathology showing  ([id number]) a 1.8 cm invasive lobular carcinoma, grade 2, with no evidence of lymphovascular invasion and ample margins.  The sentinel lymph node was negative.   Her subsequent history is as detailed below.   PAST MEDICAL HISTORY: Past Medical History:  Diagnosis Date   Breast cancer ([place])    Cancer ([place])    Dyspnea    Hypertension    Vertigo   The past medical history is significant for hyperlipidemia, remote history of kidney stones, history of asthma, history of osteopenia, history of carpal tunnel repair, history of trigger finger repair (both these two by Dr. Daylene Katayama), and history of continuing tobacco abuse.     PAST SURGICAL HISTORY: Past Surgical History:  Procedure Laterality Date   CARPAL TUNNEL RELEASE     IR PARACENTESIS  12/08/2020   MASTECTOMY      FAMILY HISTORY Family History  Problem Relation Age of Onset   Lung cancer Father        smoker   Lung cancer Paternal Grandmother   The patient's father died from lung cancer.  He was treated at [facility].  He was a smoker.  The patient's mother is in fair health (age 77 as of March 2016).  The patient has one sister. The only cancer that she knows of in the family is the patient's father's mother's, who had lung cancer. (The patient is a distant cousin of Marja Kays, who of course died from breast cancer.)    GYNECOLOGIC HISTORY: She is GX P1, first pregnancy to term at age 36.  Last menstrual period was 2005.  She never took hormone replacement.    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    Her husband, Rush Landmark, died suddenly April 15, 2013.  The patient's daughter, Carolyn Barron, is currently living in [place].  Her husband is in the [employer].  They have 4 children.  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      ADVANCED DIRECTIVES: Not in place   HEALTH  MAINTENANCE: Social History   Tobacco Use   Smoking status: Some Days    Packs/day: 0.50    Pack years: 0.00    Types: Cigarettes   Smokeless tobacco: Never  Vaping Use   Vaping Use: Never used  Substance Use Topics   Alcohol use: No   Drug use: No     Colonoscopy:  PAP:  Bone density:  Lipid panel:  Allergies  Allergen Reactions   Clindamycin/Lincomycin Rash    Current Outpatient Medications  Medication Sig Dispense Refill   abemaciclib (VERZENIO) 150 MG tablet Take 1 tablet (150 mg total) by mouth 2 (two) times daily. Swallow tablets whole. Do not chew, crush, or split tablets before swallowing. Start 12/16/2020 56 tablet 6   acetaminophen (TYLENOL) 500 MG tablet Take 500 mg by mouth every 6 (six) hours as needed for moderate pain.     albuterol (VENTOLIN HFA) 108 (90 Base) MCG/ACT inhaler Inhale 2 puffs into the lungs every 6 (six) hours as needed for wheezing or shortness of breath.     anastrozole (ARIMIDEX) 1 MG tablet Take 1 tablet (1 mg total) by mouth daily. 30 tablet 0   atorvastatin (LIPITOR) 20 MG tablet Take 20 mg by mouth daily.     cholecalciferol (VITAMIN  D3) 25 MCG (1000 UNIT) tablet Take 1,000 Units by mouth daily.     famotidine (PEPCID) 20 MG tablet Take 20 mg by mouth daily.     pantoprazole (PROTONIX) 20 MG tablet Take 20 mg by mouth daily.     PARoxetine (PAXIL) 40 MG tablet Take 40 mg by mouth every morning.     prochlorperazine (COMPAZINE) 5 MG tablet Take 1 tablet (5 mg total) by mouth every 6 (six) hours as needed for nausea or vomiting. 30 tablet 0   No current facility-administered medications for this visit.    OBJECTIVE: White woman in no acute distress There were no vitals filed for this visit.    There is no height or weight on file to calculate BMI.    ECOG FS: 1  GENERAL: Patient is a well appearing female in no acute distress HEENT:  Sclerae anicteric.  Oropharynx clear and moist. No ulcerations or evidence of oropharyngeal  candidiasis. Neck is supple.  NODES:  No cervical, supraclavicular, or axillary lymphadenopathy palpated.  BREAST EXAM:  Deferred. LUNGS:  Clear to auscultation bilaterally.  No wheezes or rhonchi. HEART:  Regular rate and rhythm. No murmur appreciated. ABDOMEN:  Soft, nontender.  Positive, normoactive bowel sounds. No organomegaly palpated. MSK:  No focal spinal tenderness to palpation. Full range of motion bilaterally in the upper extremities. EXTREMITIES:  No peripheral edema.   SKIN:  Clear with no obvious rashes or skin changes. No nail dyscrasia. NEURO:  Nonfocal. Well oriented.  Appropriate affect.    LAB RESULTS: Lab Results  Component Value Date   WBC 4.7 12/30/2020   NEUTROABS 6.2 12/16/2020   HGB 9.0 (L) 12/30/2020   HCT 30.4 (L) 12/30/2020   MCV 88.1 12/30/2020   PLT 323 12/30/2020      Chemistry      Component Value Date/Time   NA 133 (L) 12/30/2020 0347   NA 142 09/25/2014 1532   K 4.5 12/30/2020 0347   K 4.2 09/25/2014 1532   CL 103 12/30/2020 0347   CO2 24 12/30/2020 0347   CO2 27 09/25/2014 1532   BUN 24 (H) 12/30/2020 0347   BUN 14.1 09/25/2014 1532   CREATININE 1.24 (H) 12/30/2020 0347   CREATININE 0.9 09/25/2014 1532      Component Value Date/Time   CALCIUM 7.9 (L) 12/30/2020 0347   CALCIUM 9.4 09/25/2014 1532   ALKPHOS 172 (H) 12/26/2020 0430   ALKPHOS 106 09/25/2014 1532   AST 23 12/26/2020 0430   AST 11 09/25/2014 1532   ALT 12 12/26/2020 0430   ALT 9 09/25/2014 1532   BILITOT 0.4 12/26/2020 0430   BILITOT <0.20 09/25/2014 1532       Lab Results  Component Value Date   LABCA2 14 03/19/2010    No components found for: [id number]  No results for input(s): INR in the last 168 hours.  Urinalysis    Component Value Date/Time   COLORURINE YELLOW 04/16/2012 1637   APPEARANCEUR CLOUDY (A) 04/16/2012 1637   LABSPEC 1.022 04/16/2012 1637   PHURINE 6.0 04/16/2012 1637   GLUCOSEU NEGATIVE 04/16/2012 1637   HGBUR NEGATIVE 04/16/2012 1637    BILIRUBINUR NEGATIVE 04/16/2012 1637   KETONESUR NEGATIVE 04/16/2012 1637   PROTEINUR NEGATIVE 04/16/2012 1637   UROBILINOGEN 0.2 04/16/2012 1637   NITRITE NEGATIVE 04/16/2012 1637   LEUKOCYTESUR NEGATIVE 04/16/2012 1637    STUDIES: CT Abdomen Pelvis Wo Contrast  Result Date: 12/25/2020 CLINICAL DATA:  Abdominal distension in a 60 year old female. History of breast cancer with  peritoneal disease based on recent imaging. EXAM: CT ABDOMEN AND PELVIS WITHOUT CONTRAST TECHNIQUE: Multidetector CT imaging of the abdomen and pelvis was performed following the standard protocol without IV contrast. COMPARISON:  November 14, 2020. FINDINGS: Lower chest: Enlarging LEFT-sided pleural effusion as compared to recent imaging. Signs of coronary artery disease and aortic valvular calcification. LEFT-sided effusion is moderately large previously small to moderate. Hepatobiliary: No gross hepatic abnormality with similar smooth contours of the liver. Gallbladder is nondistended. Some cholelithiasis is suspected versus sludge layering dependently. Pancreas: Pancreas with similar contours.  No signs of inflammation. Spleen: Spleen normal size and contour. Adrenals/Urinary Tract: Adrenal glands are normal. Smooth renal contours. No hydronephrosis. Urinary bladder is collapsed amidst large volume abdominal ascites. Nephrolithiasis in the lower pole of the LEFT kidney. Mild fullness of LEFT intrarenal collecting systems is similar to the previous exam without frank hydronephrosis, this involves the lower pole of the LEFT kidney. Stomach/Bowel: Marked gastric thickening. Bowel displaced by ascites into the central abdomen. No pneumatosis. No bowel obstruction. Appendix is normal. Vascular/Lymphatic: Calcified atheromatous plaque of the abdominal aorta without aneurysmal dilation. Upper abdominal adenopathy is suspected adjacent to the pancreatic head in the porta hepatis. This was seen on previous imaging not as well assessed on  current examination. Estimate stability of these areas. LEFT retroperitoneal adenopathy (image 32/2) 1.6 cm short axis previously approximately 1.5 cm. Reproductive: Masslike appearance of the RIGHT ovary, soft tissue measuring approximately 6.3 cm greatest axial dimension along the RIGHT pelvic sidewall grossly stable compared to previous imaging. No LEFT adnexal mass. No pelvic lymphadenopathy. Cm Other: Large volume ascites may be increased since the previous study. Distance between abdominal wall in liver increased from 2.83.9 cm. There was quite a large volume of ascites on the prior exam as well. No pneumoperitoneum. Marked abdominal distension with grossly similar appearance. Musculoskeletal: Spinal degenerative changes and signs of diffuse skeletal metastatic disease with similar appearance. IMPRESSION: 1. Large volume ascites may be slightly increased since the previous study. 2. Enlarging LEFT-sided pleural effusion. 3. Signs of peritoneal disease and adenopathy with similar appearance. 4. Diffuse marked thickening of the stomach suspicious for metastatic involvement. 5. Nephrolithiasis. 6. 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 7. Aortic atherosclerosis. Aortic Atherosclerosis (ICD10-I70.0). Electronically Signed   By: Zetta Bills M.D.   On: 12/25/2020 16:47   DG Chest 1 View  Result Date: 12/29/2020 CLINICAL DATA:  Status post thoracentesis EXAM: CHEST  1 VIEW COMPARISON:  December 28, 2020 FINDINGS: No pneumothorax. There is no appreciable pleural effusion on the left. There is a small right pleural effusion with right base atelectasis. Lungs otherwise are clear. Heart size and pulmonary vascularity are normal. No adenopathy. There are surgical clips in the left axilla. Patient is status post left mastectomy. IMPRESSION: No pneumothorax. Left lung now clear. Small right pleural effusion with right base atelectasis. Right lung otherwise clear. Heart size normal. Status post  mastectomy on the left. Electronically Signed   By: Lowella Grip III M.D.   On: 12/29/2020 12:49   DG Chest 2 View  Result Date: 12/25/2020 CLINICAL DATA:  Shortness of breath EXAM: CHEST - 2 VIEW COMPARISON:  October 11, 2012 FINDINGS: There is mild right base atelectasis with right pleural effusion. Lungs elsewhere clear. Heart size and pulmonary vascularity are normal. No adenopathy. Patient is status post left mastectomy with surgical clips in the left axillary region. IMPRESSION: Right pleural effusion with right base atelectasis. Lungs otherwise clear. Heart size and pulmonary vascularity normal. Status post  left mastectomy. Electronically Signed   By: Lowella Grip III M.D.   On: 12/25/2020 15:31   US Paracentesis  Result Date: 12/26/2020 INDICATION: Patient with a history of metastatic breast cancer and recurrent ascites presents today for therapeutic and diagnostic paracentesis. EXAM: ULTRASOUND GUIDED PARACENTESIS MEDICATIONS: 1% lidocaine 10 mL COMPLICATIONS: None immediate. PROCEDURE: Informed written consent was obtained from the patient after a discussion of the risks, benefits and alternatives to treatment. A timeout was performed prior to the initiation of the procedure. Initial ultrasound scanning demonstrates a large amount of ascites within the right lower abdominal quadrant. The right lower abdomen was prepped and draped in the usual sterile fashion. 1% lidocaine was used for local anesthesia. Following this, a 19 gauge, 7-cm, Yueh catheter was introduced. An ultrasound image was saved for documentation purposes. The paracentesis was performed. The catheter was removed and a dressing was applied. The patient tolerated the procedure well without immediate post procedural complication. FINDINGS: A total of approximately 6.7 L of clear yellow fluid was removed. Samples were sent to the laboratory as requested by the clinical team. IMPRESSION: Successful ultrasound-guided paracentesis  yielding 6.7 liters of peritoneal fluid. Read by: Soyla Dryer, NP Electronically Signed   By: Aletta Edouard M.D.   On: 12/26/2020 13:00   DG CHEST PORT 1 VIEW  Result Date: 12/28/2020 CLINICAL DATA:  Breast cancer on chemotherapy. Hypertension. Vertigo. Recurrent paracenteses. EXAM: PORTABLE CHEST 1 VIEW COMPARISON:  12/25/2020 FINDINGS: Numerous leads and wires project over the chest. Left axillary node dissection. Left mastectomy. Midline trachea. Normal heart size and mediastinal contours. No pneumothorax. Small right pleural effusion is likely similar. Adjacent right greater than left base atelectasis. IMPRESSION: Similar small right pleural effusion with bibasilar atelectasis. Electronically Signed   By: Abigail Miyamoto M.D.   On: 12/28/2020 13:38   US THORACENTESIS ASP PLEURAL SPACE W/IMG GUIDE  Result Date: 12/29/2020 INDICATION: Patient with history of breast cancer, recurrent ascites, dyspnea, and bilateral pleural effusions, left > right. Request is made for diagnostic and therapeutic left thoracentesis. EXAM: ULTRASOUND GUIDED DIAGNOSTIC AND THERAPEUTIC LEFT THORACENTESIS MEDICATIONS: 10 mL 1% lidocaine COMPLICATIONS: None immediate. PROCEDURE: An ultrasound guided thoracentesis was thoroughly discussed with the patient and questions answered. The benefits, risks, alternatives and complications were also discussed. The patient understands and wishes to proceed with the procedure. Written consent was obtained. Ultrasound was performed to localize and mark an adequate pocket of fluid in the left chest. The area was then prepped and draped in the normal sterile fashion. 1% Lidocaine was used for local anesthesia. Under ultrasound guidance a 6 Fr Safe-T-Centesis catheter was introduced. Thoracentesis was performed. The catheter was removed and a dressing applied. FINDINGS: A total of approximately 600 mL of serosanguineous fluid was removed. Samples were sent to the laboratory as requested by the  clinical team. IMPRESSION: Successful ultrasound guided left thoracentesis yielding 600 mL of pleural fluid. Read by: Earley Abide, PA-C Electronically Signed   By: Miachel Roux M.D.   On: 12/29/2020 13:54     ASSESSMENT: 60 y.o. [place] woman status post left mastectomy and sentinel lymph node sampling in November 2010 for a T1c N0, stage IA invasive lobular carcinoma, grade 2, strongly estrogen and progesterone receptor positive, HER-2/neu negative, with a low proliferation fraction.   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  (d) cytology from paracentesis 11/20/2020 nondiagnostic  (e) biopsy of right adnexal mass 12/08/2020 most consistent with metastatic mammary carcinoma, estrogen receptor positive, HER2 nonamplified  (f) on 11/21/2020 the CEA was 6.2, CA 27-29 was 151.8, and Ca1 25 was 1108  (3) palliative radiation to the left periorbital mass completed 12/05/2020  (4) anastrozole started 11/20/2020  (a) to start abemaciclib   PLAN:  [place] is   Total encounter time 25 minutes.Wilber Bihari, NP 01/07/21 11:40 AM Medical Oncology and Hematology [facility] [place], [place] [address] Tel. [phone]    Fax. [phone]    *Total Encounter Time as defined by the Centers for Medicare and Medicaid Services includes, in addition to the face-to-face  time of a patient visit (documented in the note above) non-face-to-face time: obtaining and reviewing outside history, ordering and reviewing medications, tests or procedures, care coordination (communications with other health care professionals or caregivers) and documentation in the medical record.

## 2021-01-07 NOTE — ED Provider Notes (Addendum)
[facility] DEPT Provider Note   CSN: [id number] Arrival date & time: 01/07/21  1918     History Chief Complaint  Patient presents with   Shortness of Breath    Carolyn Barron is a 60 y.o. female past medical history of metastatic breast cancer, recurrent ascites, presenting for evaluation of shortness of breath.  Patient states symptoms have been worsening gradually.  Symptoms became worse this afternoon.  She feels as though she is unable to take a deep breath, she is very short of breath.  Her abdomen feels full as if she has developed fluid again and needs repeat paracentesis.  She is not having any chest pain, fevers, cough.  She has had multiple paracentesis, last 1 was about 2 weeks ago.  She has no active plan in place for recurrent ascites.  She has no abdominal pain.  Symptoms do not feel any different from prior instances where she required paracentesis.  Per review of medical record, last paracentesis was done on December 26, 2020.  She is followed by Dr. Jana Hakim with oncology.  The history is provided by the patient and medical records.      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   Malignant neoplasm of overlapping sites of left breast in female, estrogen receptor positive ([place]) 11/24/2020   B12 deficiency 11/24/2020   Pleural effusion 11/20/2020   AKI (acute kidney injury) ([place]) 11/20/2020   Anemia 11/20/2020   Depression 11/20/2020   GERD (gastroesophageal reflux disease) 11/20/2020   Lymphadenopathy, mediastinal 11/20/2020   Pulmonary nodule 11/20/2020   Adnexal mass 11/20/2020   Ascites  11/19/2020    Past Surgical History:  Procedure Laterality Date   CARPAL TUNNEL RELEASE     IR PARACENTESIS  12/08/2020   MASTECTOMY       OB History   No obstetric history on file.     Family History  Problem Relation Age of Onset   Lung cancer Father        smoker   Lung cancer Paternal Grandmother     Social History   Tobacco Use   Smoking status: Some Days    Packs/day: 0.50    Pack years: 0.00    Types: Cigarettes   Smokeless tobacco: Never  Vaping Use   Vaping Use: Never used  Substance Use Topics   Alcohol use: No   Drug use: No    Home Medications Prior to Admission medications   Medication Sig Start Date End Date Taking? Authorizing Provider  abemaciclib (VERZENIO) 150 MG tablet Take 1 tablet (150 mg total) by mouth 2 (two) times daily. Swallow tablets whole. Do not chew, crush, or split tablets before swallowing. Start 12/16/2020 12/11/20   Magrinat, Virgie Dad, MD  acetaminophen (TYLENOL) 500 MG tablet Take 500 mg by mouth every 6 (six) hours as needed for moderate pain.    [provider]  albuterol (VENTOLIN HFA) 108 (90 Base) MCG/ACT inhaler Inhale 2 puffs into the lungs every 6 (six) hours as needed for wheezing or shortness of breath.    [provider]  anastrozole (  ARIMIDEX) 1 MG tablet Take 1 tablet (1 mg total) by mouth daily. 11/22/20   Little Ishikawa, MD  atorvastatin (LIPITOR) 20 MG tablet Take 20 mg by mouth daily. 11/16/20   [provider]  cholecalciferol (VITAMIN D3) 25 MCG (1000 UNIT) tablet Take 1,000 Units by mouth daily.    [provider]  famotidine (PEPCID) 20 MG tablet Take 20 mg by mouth daily. 12/17/20   [provider]  pantoprazole (PROTONIX) 20 MG tablet Take 20 mg by mouth daily.    [provider]  PARoxetine (PAXIL) 40 MG tablet Take 40 mg by mouth every morning.    [provider]  prochlorperazine (COMPAZINE) 5 MG tablet Take 1 tablet (5 mg total) by mouth every 6  (six) hours as needed for nausea or vomiting. 01/06/21   Magrinat, Virgie Dad, MD    Allergies    Clindamycin/lincomycin  Review of Systems   Review of Systems  All other systems reviewed and are negative.  Physical Exam Updated Vital Signs BP 114/73   Pulse (!) 107   Temp 98.3 F (36.8 C) (Oral)   Resp (!) 22   SpO2 95%   Physical Exam Vitals and nursing note reviewed.  Constitutional:      Appearance: She is well-developed.  HENT:     Head: Normocephalic and atraumatic.  Eyes:     Conjunctiva/sclera: Conjunctivae normal.  Cardiovascular:     Rate and Rhythm: Normal rate and regular rhythm.  Pulmonary:     Effort: Pulmonary effort is normal.     Breath sounds: No wheezing.  Abdominal:     General: Bowel sounds are normal. There is distension.     Palpations: Abdomen is soft.     Tenderness: There is no abdominal tenderness.  Musculoskeletal:     Right lower leg: Edema present.     Left lower leg: Edema present.  Skin:    General: Skin is warm.  Neurological:     Mental Status: She is alert.  Psychiatric:        Behavior: Behavior normal.    ED Results / Procedures / Treatments   Labs (all labs ordered are listed, but only abnormal results are displayed) Labs Reviewed  CBC WITH DIFFERENTIAL/PLATELET - Abnormal; Notable for the following components:      Result Value   RBC 2.96 (*)    Hemoglobin 7.9 (*)    HCT 26.2 (*)    RDW 19.7 (*)    All other components within normal limits  COMPREHENSIVE METABOLIC PANEL - Abnormal; Notable for the following components:   Sodium 133 (*)    BUN 25 (*)    Creatinine, Ser 1.77 (*)    Calcium 7.9 (*)    Total Protein 5.5 (*)    Albumin 1.7 (*)    Alkaline Phosphatase 217 (*)    Total Bilirubin 0.1 (*)    GFR, Estimated 32 (*)    All other components within normal limits    EKG EKG Interpretation  Date/Time:  Wednesday January 07 2021 21:49:02 EDT Ventricular Rate:  102 PR Interval:  117 QRS Duration: 90 QT  Interval:  344 QTC Calculation: 449 R Axis:   77 Text Interpretation: Sinus tachycardia Borderline T wave abnormalities No acute changes No significant change since last tracing Confirmed by Varney Biles ([id number]) on 01/07/2021 11:01:43 PM  Radiology DG Abdomen Acute W/Chest  Result Date: 01/07/2021 CLINICAL DATA:  Shortness of breath and abdominal pain EXAM: DG ABDOMEN ACUTE WITH 1  VIEW CHEST COMPARISON:  12/29/2020 FINDINGS: Single-view chest demonstrates no focal opacity or pleural effusion. Normal cardiomediastinal silhouette. No pneumothorax. Supine and upright views of the abdomen demonstrate no free air beneath the diaphragm. Nonobstructed gas pattern with scattered colon gas. No radiopaque calculi. IMPRESSION: Negative abdominal radiographs.  No acute cardiopulmonary disease. Electronically Signed   By: Donavan Foil M.D.   On: 01/07/2021 22:22    Procedures Procedures   Medications Ordered in ED Medications - No data to display  ED Course  I have reviewed the triage vital signs and the nursing notes.  Pertinent labs & imaging results that were available during my care of the patient were reviewed by me and considered in my medical decision making (see chart for details).    MDM Rules/Calculators/A&P                          Patient is 60 year old female with history of metastatic breast cancer, recurrent ascites requiring paracentesis, presenting for abdominal distention with shortness of breath.  Symptoms feel similar to prior instances where she required paracentesis and/or thoracentesis for malignant effusion and ascites.  She has not having any abdominal pain, chest pain, cough, fevers.  She has large volume ascites on examination without tenderness to suggest SBP.  She has some faint expiratory wheezes bilaterally though does have normal work of breathing.  She is afebrile plain film shows no obvious pleural effusion.  Did consider PE, however patient is not hypoxic, she is not  having chest pain, her symptoms feel exactly like they have felt over the numerous occasions where she required paracentesis.  considering patient's ascites and symptoms, believe she needs paracentesis.  Unfortunately, patient ambulated and became very short of breath and described weakness with tachycardia to 130s.  She was not hypoxic with ambulation. Do not believe she is appropriate for discharge with outpatient paracentesis tomorrow.  Patient will be admitted overnight.   Final Clinical Impression(s) / ED Diagnoses Final diagnoses:  Malignant ascites  Shortness of breath    Rx / DC Orders ED Discharge Orders     None        Dane Bloch, [place] N, PA-C 01/08/21 0016    Cionna Collantes, [place] N, PA-C 01/08/21 0017    Varney Biles, MD 01/08/21 [phone]

## 2021-01-07 NOTE — ED Triage Notes (Addendum)
Pt brought in by EMS from home c/o SOB started yesterday and getting worst today. Per pt she had fluid build up in the abdomen that got drain 2 weeks ago. Pt a/ox4. Pt hx of breast CA.   Temp 98 BP 106/75 HR 108 RR 20 O2sat 100 on RA

## 2021-01-08 ENCOUNTER — Observation Stay (HOSPITAL_COMMUNITY): Payer: 59

## 2021-01-08 ENCOUNTER — Other Ambulatory Visit: Payer: Self-pay | Admitting: Oncology

## 2021-01-08 ENCOUNTER — Encounter (HOSPITAL_COMMUNITY): Payer: Self-pay | Admitting: Internal Medicine

## 2021-01-08 ENCOUNTER — Observation Stay (HOSPITAL_BASED_OUTPATIENT_CLINIC_OR_DEPARTMENT_OTHER): Payer: 59

## 2021-01-08 ENCOUNTER — Inpatient Hospital Stay: Payer: 59 | Admitting: Adult Health

## 2021-01-08 ENCOUNTER — Inpatient Hospital Stay: Payer: 59 | Attending: Radiation Oncology

## 2021-01-08 DIAGNOSIS — M8580 Other specified disorders of bone density and structure, unspecified site: Secondary | ICD-10-CM | POA: Insufficient documentation

## 2021-01-08 DIAGNOSIS — R0609 Other forms of dyspnea: Secondary | ICD-10-CM | POA: Diagnosis not present

## 2021-01-08 DIAGNOSIS — Z79811 Long term (current) use of aromatase inhibitors: Secondary | ICD-10-CM | POA: Insufficient documentation

## 2021-01-08 DIAGNOSIS — R188 Other ascites: Secondary | ICD-10-CM | POA: Insufficient documentation

## 2021-01-08 DIAGNOSIS — R609 Edema, unspecified: Secondary | ICD-10-CM | POA: Diagnosis not present

## 2021-01-08 DIAGNOSIS — Z79899 Other long term (current) drug therapy: Secondary | ICD-10-CM | POA: Insufficient documentation

## 2021-01-08 DIAGNOSIS — N179 Acute kidney failure, unspecified: Secondary | ICD-10-CM | POA: Diagnosis not present

## 2021-01-08 DIAGNOSIS — N189 Chronic kidney disease, unspecified: Secondary | ICD-10-CM

## 2021-01-08 DIAGNOSIS — Z170 Estrogen receptor positive status [ER+]: Secondary | ICD-10-CM | POA: Insufficient documentation

## 2021-01-08 DIAGNOSIS — C7949 Secondary malignant neoplasm of other parts of nervous system: Secondary | ICD-10-CM | POA: Insufficient documentation

## 2021-01-08 DIAGNOSIS — C50919 Malignant neoplasm of unspecified site of unspecified female breast: Secondary | ICD-10-CM | POA: Insufficient documentation

## 2021-01-08 DIAGNOSIS — R0602 Shortness of breath: Secondary | ICD-10-CM | POA: Diagnosis present

## 2021-01-08 LAB — COMPREHENSIVE METABOLIC PANEL
ALT: 23 U/L (ref 0–44)
AST: 37 U/L (ref 15–41)
Albumin: 1.7 g/dL — ABNORMAL LOW (ref 3.5–5.0)
Alkaline Phosphatase: 217 U/L — ABNORMAL HIGH (ref 38–126)
Anion gap: 5 (ref 5–15)
BUN: 25 mg/dL — ABNORMAL HIGH (ref 8–23)
CO2: 24 mmol/L (ref 22–32)
Calcium: 7.8 mg/dL — ABNORMAL LOW (ref 8.9–10.3)
Chloride: 102 mmol/L (ref 98–111)
Creatinine, Ser: 1.84 mg/dL — ABNORMAL HIGH (ref 0.44–1.00)
GFR, Estimated: 31 mL/min — ABNORMAL LOW (ref >60)
Glucose, Bld: 99 mg/dL (ref 70–99)
Potassium: 3.8 mmol/L (ref 3.5–5.1)
Sodium: 131 mmol/L — ABNORMAL LOW (ref 135–145)
Total Bilirubin: 0.2 mg/dL — ABNORMAL LOW (ref 0.3–1.2)
Total Protein: 5.3 g/dL — ABNORMAL LOW (ref 6.5–8.1)

## 2021-01-08 LAB — ECHOCARDIOGRAM COMPLETE
AR max vel: 2.2 cm2
AV Peak grad: 12.1 mmHg
Ao pk vel: 1.74 m/s
Area-P 1/2: 6.22 cm2
S' Lateral: 2 cm

## 2021-01-08 LAB — CBC
HCT: 25 % — ABNORMAL LOW (ref 36.0–46.0)
Hemoglobin: 7.6 g/dL — ABNORMAL LOW (ref 12.0–15.0)
MCH: 26.8 pg (ref 26.0–34.0)
MCHC: 30.4 g/dL (ref 30.0–36.0)
MCV: 88 fL (ref 80.0–100.0)
Platelets: 244 10*3/uL (ref 150–400)
RBC: 2.84 MIL/uL — ABNORMAL LOW (ref 3.87–5.11)
RDW: 19.6 % — ABNORMAL HIGH (ref 11.5–15.5)
WBC: 4.3 10*3/uL (ref 4.0–10.5)
nRBC: 0 % (ref 0.0–0.2)

## 2021-01-08 LAB — BODY FLUID CELL COUNT WITH DIFFERENTIAL
Eos, Fluid: 1 %
Lymphs, Fluid: 64 %
Monocyte-Macrophage-Serous Fluid: 26 % — ABNORMAL LOW (ref 50–90)
Neutrophil Count, Fluid: 9 % (ref 0–25)
Total Nucleated Cell Count, Fluid: 243 cu mm (ref 0–1000)

## 2021-01-08 LAB — PROTIME-INR
INR: 1.1 (ref 0.8–1.2)
Prothrombin Time: 14.3 seconds (ref 11.4–15.2)

## 2021-01-08 LAB — RESP PANEL BY RT-PCR (FLU A&B, COVID) ARPGX2
Influenza A by PCR: NEGATIVE
Influenza B by PCR: NEGATIVE
SARS Coronavirus 2 by RT PCR: NEGATIVE

## 2021-01-08 LAB — D-DIMER, QUANTITATIVE: D-Dimer, Quant: 4.54 ug/mL-FEU — ABNORMAL HIGH (ref 0.00–0.50)

## 2021-01-08 LAB — BRAIN NATRIURETIC PEPTIDE: B Natriuretic Peptide: 30.7 pg/mL (ref 0.0–100.0)

## 2021-01-08 MED ORDER — ALBUMIN HUMAN 25 % IV SOLN
25.0000 g | Freq: Once | INTRAVENOUS | Status: AC
  Administered 2021-01-08: 25 g via INTRAVENOUS
  Filled 2021-01-08: qty 100

## 2021-01-08 MED ORDER — PAROXETINE HCL 20 MG PO TABS
40.0000 mg | ORAL_TABLET | ORAL | Status: DC
  Administered 2021-01-08 – 2021-01-09 (×2): 40 mg via ORAL
  Filled 2021-01-08 (×2): qty 2

## 2021-01-08 MED ORDER — ACETAMINOPHEN 325 MG PO TABS: 650.0000 mg | ORAL_TABLET | Freq: Four times a day (QID) | ORAL | Status: DC | PRN

## 2021-01-08 MED ORDER — ACETAMINOPHEN 650 MG RE SUPP: 650.0000 mg | Freq: Four times a day (QID) | RECTAL | Status: DC | PRN

## 2021-01-08 MED ORDER — LIDOCAINE HCL 1 % IJ SOLN
INTRAMUSCULAR | Status: AC
  Filled 2021-01-08: qty 20

## 2021-01-08 MED ORDER — NICOTINE 14 MG/24HR TD PT24
14.0000 mg | MEDICATED_PATCH | Freq: Every day | TRANSDERMAL | Status: DC
  Administered 2021-01-08 – 2021-01-09 (×2): 14 mg via TRANSDERMAL
  Filled 2021-01-08 (×2): qty 1

## 2021-01-08 MED ORDER — ONDANSETRON HCL 4 MG PO TABS: 4.0000 mg | ORAL_TABLET | Freq: Four times a day (QID) | ORAL | Status: DC | PRN

## 2021-01-08 MED ORDER — ATORVASTATIN CALCIUM 20 MG PO TABS
20.0000 mg | ORAL_TABLET | Freq: Every day | ORAL | Status: DC
  Administered 2021-01-08 – 2021-01-09 (×2): 20 mg via ORAL
  Filled 2021-01-08 (×2): qty 1

## 2021-01-08 MED ORDER — TECHNETIUM TO 99M ALBUMIN AGGREGATED
4.4000 | Freq: Once | INTRAVENOUS | Status: AC
  Administered 2021-01-08: 4.4 via INTRAVENOUS

## 2021-01-08 MED ORDER — ENOXAPARIN SODIUM 30 MG/0.3ML IJ SOSY
30.0000 mg | PREFILLED_SYRINGE | INTRAMUSCULAR | Status: DC
  Administered 2021-01-08 – 2021-01-09 (×2): 30 mg via SUBCUTANEOUS
  Filled 2021-01-08 (×2): qty 0.3

## 2021-01-08 MED ORDER — PANTOPRAZOLE SODIUM 20 MG PO TBEC
20.0000 mg | DELAYED_RELEASE_TABLET | Freq: Every day | ORAL | Status: DC
  Administered 2021-01-08 – 2021-01-09 (×2): 20 mg via ORAL
  Filled 2021-01-08 (×2): qty 1

## 2021-01-08 MED ORDER — OXYCODONE HCL 5 MG PO TABS
5.0000 mg | ORAL_TABLET | Freq: Four times a day (QID) | ORAL | Status: DC | PRN
  Administered 2021-01-08 – 2021-01-09 (×2): 5 mg via ORAL
  Filled 2021-01-08 (×2): qty 1

## 2021-01-08 MED ORDER — CAPECITABINE 500 MG PO TABS
1500.0000 mg | ORAL_TABLET | Freq: Two times a day (BID) | ORAL | 6 refills | Status: DC
  Filled 2021-01-08: qty 84, 14d supply, fill #0

## 2021-01-08 MED ORDER — ALBUTEROL SULFATE (2.5 MG/3ML) 0.083% IN NEBU: 2.5000 mg | INHALATION_SOLUTION | RESPIRATORY_TRACT | Status: DC | PRN

## 2021-01-08 MED ORDER — ONDANSETRON HCL 4 MG/2ML IJ SOLN: 4.0000 mg | Freq: Four times a day (QID) | INTRAMUSCULAR | Status: DC | PRN

## 2021-01-08 NOTE — Progress Notes (Signed)
BLE venous duplex has been completed.  Results can be found under chart review under CV PROC. 01/08/2021 9:31 AM Breanna Mcdaniel RVT, RDMS

## 2021-01-08 NOTE — Consult Note (Addendum)
Carolyn Barron   DOB:August 08, 1960   FV#:[id number]   FMB#:[id number]  Subjective: Carolyn Barron is a a 60 year old Barron with metastatic breast cancer, stage III CKD, anemia of chronic disease, malignant ascites, HTN, depression who was admitted with increased dyspnea and increased ascites.  She was recently discharged from the hospital of 6/14 for ascites requiring paracentesis and pleural effusion requiring thoracentesis.    Since her admission she has undergone a paracentesis yielding 4.5 L of fluids, bilateral lower extremity venous doppler which ruled out DVT, chest xray which was negative.  She is undergoing VQ scan and echocardiogram to further evaluate her shortness of breath, which were both unrevealing.  Carolyn Barron notes her swelling has improved.  She has no further shortness of breath since her paracentesis.  She wants to know what the plan is for her treatment moving forward.    Objective:  Vitals:   01/08/21 0955 01/08/21 1350  BP: 115/70 111/64  Pulse: 91 98  Resp: 19 17  Temp: 98.4 F (36.9 C) 97.8 F (36.6 C)  SpO2: 100% 98%    There is no height or weight on file to calculate BMI. No intake or output data in the 24 hours ending 01/08/21 1445   Sclerae unicteric  Oropharynx shows no thrush or other lesions  No cervical or supraclavicular adenopathy  Lungs no rales or wheezes--auscultated anterolaterally  Heart regular rate and rhythm  Abdomen soft, +BS  Neuro nonfocal    CBG (last 3)  No results for input(s): GLUCAP in the last 72 hours.   Labs:  Lab Results  Component Value Date   WBC 4.3 01/08/2021   HGB 7.6 (L) 01/08/2021   HCT 25.0 (L) 01/08/2021   MCV 88.0 01/08/2021   PLT 244 01/08/2021   NEUTROABS 3.0 01/07/2021    _0 @  Urine Studies No results for input(s): UHGB, CRYS in the last 72 hours.  Invalid input(s): UACOL, UAPR, USPG, UPH, UTP, UGL, UKET, UBIL, UNIT, UROB, ULEU, UEPI, UWBC, URBC, UBAC, CAST, UCOM, BILUA  Basic Metabolic Panel: Recent Labs   Lab 01/07/21 2134 01/08/21 0400  NA 133* 131*  K 4.0 3.8  CL 100 102  CO2 24 24  GLUCOSE 92 99  BUN 25* 25*  CREATININE 1.77* 1.84*  CALCIUM 7.9* 7.8*   GFR Estimated Creatinine Clearance: 25.4 mL/min (A) (by C-G formula based on SCr of 1.84 mg/dL (H)). Liver Function Tests: Recent Labs  Lab 01/07/21 2134 01/08/21 0400  AST 40 37  ALT 22 23  ALKPHOS 217* 217*  BILITOT 0.1* 0.2*  PROT 5.5* 5.3*  ALBUMIN 1.7* 1.7*   No results for input(s): LIPASE, AMYLASE in the last 168 hours. No results for input(s): AMMONIA in the last 168 hours. Coagulation profile Recent Labs  Lab 01/08/21 0400  INR 1.1    CBC: Recent Labs  Lab 01/07/21 2134 01/08/21 0400  WBC 4.5 4.3  NEUTROABS 3.0  --   HGB 7.9* 7.6*  HCT 26.2* 25.0*  MCV 88.5 88.0  PLT 260 244   Cardiac Enzymes: No results for input(s): CKTOTAL, CKMB, CKMBINDEX, TROPONINI in the last 168 hours. BNP: Invalid input(s): POCBNP CBG: No results for input(s): GLUCAP in the last 168 hours. D-Dimer Recent Labs    01/08/21 0400  DDIMER 4.54*   Hgb A1c No results for input(s): HGBA1C in the last 72 hours. Lipid Profile No results for input(s): CHOL, HDL, LDLCALC, TRIG, CHOLHDL, LDLDIRECT in the last 72 hours. Thyroid function studies No results for input(s): TSH, T4TOTAL,  T3FREE, THYROIDAB in the last 72 hours.  Invalid input(s): FREET3 Anemia work up No results for input(s): VITAMINB12, FOLATE, FERRITIN, TIBC, IRON, RETICCTPCT in the last 72 hours. Microbiology Recent Results (from the past 240 hour(s))  Resp Panel by RT-PCR (Flu A&B, Covid) Nasopharyngeal Swab     Status: None   Collection Time: 01/08/21  7:49 AM   Specimen: Nasopharyngeal Swab; Nasopharyngeal(NP) swabs in vial transport medium  Result Value Ref Range Status   SARS Coronavirus 2 by RT PCR NEGATIVE NEGATIVE Final    Comment: (NOTE) SARS-CoV-2 target nucleic acids are NOT DETECTED.  The SARS-CoV-2 RNA is generally detectable in upper  respiratory specimens during the acute phase of infection. The lowest concentration of SARS-CoV-2 viral copies this assay can detect is 138 copies/mL. A negative result does not preclude SARS-Cov-2 infection and should not be used as the sole basis for treatment or other patient management decisions. A negative result may occur with  improper specimen collection/handling, submission of specimen other than nasopharyngeal swab, presence of viral mutation(s) within the areas targeted by this assay, and inadequate number of viral copies(<138 copies/mL). A negative result must be combined with clinical observations, patient history, and epidemiological information. The expected result is Negative.  Fact Sheet for Patients:  EntrepreneurPulse.com.au  Fact Sheet for Healthcare Providers:  IncredibleEmployment.be  This test is no t yet approved or cleared by the [place] FDA and  has been authorized for detection and/or diagnosis of SARS-CoV-2 by FDA under an Emergency Use Authorization (EUA). This EUA will remain  in effect (meaning this test can be used) for the duration of the COVID-19 declaration under Section 564(b)(1) of the Act, 21 U.S.C.section 360bbb-3(b)(1), unless the authorization is terminated  or revoked sooner.       Influenza A by PCR NEGATIVE NEGATIVE Final   Influenza B by PCR NEGATIVE NEGATIVE Final    Comment: (NOTE) The Xpert Xpress SARS-CoV-2/FLU/RSV plus assay is intended as an aid in the diagnosis of influenza from Nasopharyngeal swab specimens and should not be used as a sole basis for treatment. Nasal washings and aspirates are unacceptable for Xpert Xpress SARS-CoV-2/FLU/RSV testing.  Fact Sheet for Patients: EntrepreneurPulse.com.au  Fact Sheet for Healthcare Providers: IncredibleEmployment.be  This test is not yet approved or cleared by the [place] FDA and has been  authorized for detection and/or diagnosis of SARS-CoV-2 by FDA under an Emergency Use Authorization (EUA). This EUA will remain in effect (meaning this test can be used) for the duration of the COVID-19 declaration under Section 564(b)(1) of the Act, 21 U.S.C. section 360bbb-3(b)(1), unless the authorization is terminated or revoked.  Performed at [facility], [place] [address], [place], [place] [address]       Studies:  US Abdomen Complete  Result Date: 01/08/2021 CLINICAL DATA:  Acute kidney injury.  History of breast cancer. EXAM: ABDOMEN ULTRASOUND COMPLETE COMPARISON:  CT abdomen pelvis 12/25/2020 FINDINGS: Gallbladder: No gallstones or wall thickening visualized. No sonographic Murphy sign noted by sonographer. Common bile duct: Diameter: 4 mm. Liver: No focal lesion identified. Increased parenchymal echogenicity. Portal vein is patent on color Doppler imaging with normal direction of blood flow towards the liver. IVC: No abnormality visualized. Pancreas: Visualized portion unremarkable. Spleen: Size and appearance within normal limits. Right Kidney: Length: 10.3 cm. Echogenicity within normal limits. No mass or hydronephrosis visualized. Left Kidney: Length: 8.2 cm. Echogenicity within normal limits. No mass or hydronephrosis visualized. Abdominal aorta: No aneurysm visualized. Other findings: At least moderate volume simple  free fluid ascites. At least trace left pleural effusion. IMPRESSION: 1. Hepatic steatosis. Please note limited evaluation for focal hepatic masses in a patient with hepatic steatosis due to decreased penetration of the acoustic ultrasound waves. 2. At least moderate volume free fluid ascites. 3. At least trace left pleural effusion. 4. Limited evaluation due to inability to hold breath. Electronically Signed   By: Iven Finn M.D.   On: 01/08/2021 03:53   US Paracentesis  Result Date: 01/08/2021 INDICATION: Patient with history of metastatic  breast cancer, abdominal distension, and recurrent ascites. Request is made for diagnostic and therapeutic paracentesis. EXAM: ULTRASOUND GUIDED DIAGNOSTIC AND THERAPEUTIC PARACENTESIS MEDICATIONS: 15 mL 1% lidocaine COMPLICATIONS: None immediate. PROCEDURE: Informed written consent was obtained from the patient after a discussion of the risks, benefits and alternatives to treatment. A timeout was performed prior to the initiation of the procedure. Initial ultrasound scanning demonstrates a large amount of ascites within the left lower abdominal quadrant. The left lower abdomen was prepped and draped in the usual sterile fashion. 1% lidocaine was used for local anesthesia. Following this, a 19 gauge, 7-cm, Yueh catheter was introduced. An ultrasound image was saved for documentation purposes. The paracentesis was performed. The catheter was removed and a dressing was applied. The patient tolerated the procedure well without immediate post procedural complication. FINDINGS: A total of approximately 4.5 L of clear yellow fluid was removed. Samples were sent to the laboratory as requested by the clinical team. IMPRESSION: Successful ultrasound-guided paracentesis yielding 4.5 L of peritoneal fluid. Read by: Earley Abide, PA-C Electronically Signed   By: Sandi Mariscal M.D.   On: 01/08/2021 11:45   DG CHEST PORT 1 VIEW  Result Date: 01/08/2021 CLINICAL DATA:  Hypoxemia EXAM: PORTABLE CHEST 1 VIEW COMPARISON:  12/29/2020 FINDINGS: The heart size and mediastinal contours are within normal limits. Both lungs are clear. The visualized skeletal structures are unremarkable. IMPRESSION: No acute abnormality of the lungs in AP portable projection. Electronically Signed   By: Eddie Candle M.D.   On: 01/08/2021 13:19   DG Abdomen Acute W/Chest  Result Date: 01/07/2021 CLINICAL DATA:  Shortness of breath and abdominal pain EXAM: DG ABDOMEN ACUTE WITH 1 VIEW CHEST COMPARISON:  12/29/2020 FINDINGS: Single-view chest  demonstrates no focal opacity or pleural effusion. Normal cardiomediastinal silhouette. No pneumothorax. Supine and upright views of the abdomen demonstrate no free air beneath the diaphragm. Nonobstructed gas pattern with scattered colon gas. No radiopaque calculi. IMPRESSION: Negative abdominal radiographs.  No acute cardiopulmonary disease. Electronically Signed   By: Donavan Foil M.D.   On: 01/07/2021 22:22   VAS [place] LOWER EXTREMITY VENOUS (DVT)  Result Date: 01/08/2021  Lower Venous DVT Study Patient Name:  Carolyn Barron  Date of Exam:   01/08/2021 Medical Rec #: [id number]     Accession #:    [id number] Date of Birth: 25-Mar-1961     Patient Gender: F Patient Age:   60Y Exam Location:  [facility] Procedure:      VAS [place] LOWER EXTREMITY VENOUS (DVT) Referring Phys: [id number] VISHAL R PATEL --------------------------------------------------------------------------------  Indications: Edema.  Risk Factors: Chemotherapy Cancer - metastatic breast. Comparison Study: No previous exams Performing Technologist: Jody Hill RVT, RDMS  Examination Guidelines: A complete evaluation includes B-mode imaging, spectral Doppler, color Doppler, and power Doppler as needed of all accessible portions of each vessel. Bilateral testing is considered an integral part of a complete examination. Limited examinations for reoccurring indications may be performed as noted. The reflux portion of  the exam is performed with the patient in reverse Trendelenburg.  +---------+---------------+---------+-----------+----------+--------------+ RIGHT    CompressibilityPhasicitySpontaneityPropertiesThrombus Aging +---------+---------------+---------+-----------+----------+--------------+ CFV      Full           Yes      Yes                                 +---------+---------------+---------+-----------+----------+--------------+ SFJ      Full                                                         +---------+---------------+---------+-----------+----------+--------------+ FV Prox  Full           Yes      Yes                                 +---------+---------------+---------+-----------+----------+--------------+ FV Mid   Full           Yes      Yes                                 +---------+---------------+---------+-----------+----------+--------------+ FV DistalFull           Yes      Yes                                 +---------+---------------+---------+-----------+----------+--------------+ PFV      Full                                                        +---------+---------------+---------+-----------+----------+--------------+ POP      Full           Yes      Yes                                 +---------+---------------+---------+-----------+----------+--------------+ PTV      Full                                                        +---------+---------------+---------+-----------+----------+--------------+ PERO     Full                                                        +---------+---------------+---------+-----------+----------+--------------+   +---------+---------------+---------+-----------+----------+--------------+ LEFT     CompressibilityPhasicitySpontaneityPropertiesThrombus Aging +---------+---------------+---------+-----------+----------+--------------+ CFV      Full           Yes      Yes                                 +---------+---------------+---------+-----------+----------+--------------+  SFJ      Full                                                        +---------+---------------+---------+-----------+----------+--------------+ FV Prox  Full           Yes      Yes                                 +---------+---------------+---------+-----------+----------+--------------+ FV Mid   Full           Yes      Yes                                  +---------+---------------+---------+-----------+----------+--------------+ FV DistalFull           Yes      Yes                                 +---------+---------------+---------+-----------+----------+--------------+ PFV      Full                                                        +---------+---------------+---------+-----------+----------+--------------+ POP      Full           Yes      Yes                                 +---------+---------------+---------+-----------+----------+--------------+ PTV      Full                                                        +---------+---------------+---------+-----------+----------+--------------+ PERO     Full                                                        +---------+---------------+---------+-----------+----------+--------------+     Summary: BILATERAL: - No evidence of deep vein thrombosis seen in the lower extremities, bilaterally. - No evidence of superficial venous thrombosis in the lower extremities, bilaterally. -No evidence of popliteal cyst, bilaterally. -Bilateral subcutaneous edema   *See table(s) above for measurements and observations. Electronically signed by Monica Martinez MD on 01/08/2021 at 12:44:50 PM.    Final     Assessment: 60 y.o. Carolyn Barron status post left mastectomy and sentinel lymph node sampling in November 2010 for a T1c N0, stage IA invasive lobular carcinoma, grade 2, strongly estrogen and progesterone receptor positive, HER-2/neu negative, with a low proliferation fraction.   (1) On tamoxifen starting December 2010, discontinued December 2013 for financial reasons, resumed briefly  March 2016  METASTATIC DISEASE: (2) presenting with diplopia March 2022             (a) MRA head w/o contrast 10/01/2020 shows a small extradural ICA aneurysm             (b) repeat MRI with contrast (date?) showed a mass behind the left eye             (c) CT scan of the chest abdomen and  pelvis 11/14/2020 shows bulky mediastinal adenopathy, mild right hilar adenopathy, indeterminate bilateral very small pulmonary nodules, large volume ascites, hepatoduodenal ligament and ileocolic mesenteric adenopathy, and a 5 cm soft tissue lesion in the right adnexal space.             (d) cytology from paracentesis 11/20/2020 nondiagnostic             (e) biopsy of right adnexal mass 12/08/2020 most consistent with metastatic mammary carcinoma, estrogen receptor positive, HER2 nonamplified             (f) on 11/21/2020 the CEA was 6.2, CA 27-29 was 151.8, and Ca125 was 1108   (3) palliative radiation to the left periorbital mass completed 12/05/2020   (4) anastrozole started 11/20/2020             (a) started Abemaciclib around 12/09/2020    Plan:  Metastatic breast cancer: She is continuing to have ascites and has been on Abemaciclib for about four weeks.  I reviewed with her that I spoke with Dr. Jana Hakim who will be coming up to speak to her soon about changing treatments to chemotherapy to get ahead of the cancer, and hopefully prevent continued ascites.   Malignant ascites.  I reviewed her scan and the areas in her body that have cancer.  She and I reviewed that she has stage IV noncurable breast cancer.  I reviewed that goals of treatment in stage IV breast cancer are controlling the disease from worsening, which she understands.  I reviewed that our goal also is to improve her quality of life, and if she continues to have recurrent increased ascites requiring paracenteses we can have a drain placed that she can drain at home to prevent readmissions.  She understands this.   Shortness of breath: Syna notes that this is improved since the paracenteses.  I reviewed her echo and scan results with her today.   4. Other medical problems per the primary treatment team.  Thank you for taking excellent care of our mutual patient.    Wilber Bihari, NP 01/08/21 5:07 PM Medical Oncology and  Hematology [facility] [place], [place] [address] Tel. [phone]    Fax. [phone]   ADDENDUM: I met with Carolyn Barron in her hospital room to review her situation. She has tolerated the anastrozole/abemaciclib generally well, with diarrhea as her major side effect. However we have not made much progress with her cancer, and she had to be readmitted for paracentesis and general support. Luckily studies so far do not document any new problems, only complications of her uncontrolled disease.  Accordingly we are stopping the anastrozole and abemaciclib. She will start capecitabine when she returns to see [place] next week and I have requested an appointment with [place] at 4 PM 6/30 (labs 3:30 PM). I am hopeful we will have the capecitabine available for her by then and she can start it 07/01.  I let her know we share her disappointment the initial treatment did not work but we have other  options and I encouraged her to continue to accept therapy.  From our point of view the patient may be discharged when you and the patient feels she is stable.  I personally saw this patient and performed a substantive portion of this encounter with the listed APP documented above.   Chauncey Cruel, MD Medical Oncology and Hematology [facility] [address] [place], [place] [address] Tel. [phone]    Fax. [phone]

## 2021-01-08 NOTE — Progress Notes (Signed)
  Echocardiogram 2D Echocardiogram has been performed.  Carolyn Barron 01/08/2021, 2:45 PM

## 2021-01-08 NOTE — Procedures (Signed)
PROCEDURE SUMMARY:  Successful image-guided paracentesis from the left lateral abdomen.  Yielded 4.5 liters of clear yellow fluid.  No immediate complications.  EBL = 0 mL. Patient tolerated well.   Specimen was sent for labs.  Please see imaging section of Epic for full dictation.   Claris Pong Myeisha Kruser PA-C 01/08/2021 11:22 AM

## 2021-01-08 NOTE — H&P (Signed)
History and Physical    Carolyn Barron PPI:[id number] DOB: [birth year] DOA: 01/07/2021  PCP: [name], DO  Patient coming from: Home via EMS  I have personally briefly reviewed patient's old medical records in [place]  Chief Complaint: Dyspnea, abdominal ascites  HPI: Carolyn Barron is a 60 y.o. female with medical history significant for metastatic breast cancer, CKD stage III, anemia of chronic disease, ascites, HLD, depression/anxiety who presented to the ED for evaluation of shortness of breath and abdominal swelling.  Patient states that she has developed recurrent abdominal swelling over the last week.  She has been having progressive dyspnea with minimal exertion and difficulty with deep inspiration.  She has had occasional diaphoresis but denies subjective fevers.  She has had nausea without emesis.  She says 2 days ago she had significant watery diarrhea which was attributed to her Verzenio oral chemotherapy.  She says she was started on medication to help with diarrhea and she has seen improvement with last bowel movement yesterday which appeared formed.  She has not had any chest pain, cough, dysuria.  She has seen some increased swelling in both of her legs, right greater than left, which is new.  She is a current smoker, says she is down to 5-6 cigarettes/day.  ED Course:  Initial vitals showed BP 109/65, pulse 103, RR 20, temp 98.3 F, SPO2 99% on room air.  Labs show sodium 133, potassium 4.0, bicarb 24, BUN 25, creatinine 1.77 (previously 1.24 on 12/29/2020), serum glucose 92, AST 40, ALT 22, alk phos 217, total bilirubin 0.1, WBC 4.5, hemoglobin 7.9, platelets 260,000.  1 view CXR negative for focal opacity or pleural effusion.  Abdominal x-ray negative.  Due to dyspnea and tachycardia with ambulation, the hospitalist service was consulted to admit for further evaluation and management.  Review of Systems: All systems reviewed and are negative except as documented in  history of present illness above.   Past Medical History:  Diagnosis Date   Breast cancer ([place])    Cancer ([place])    Dyspnea    Hypertension    Vertigo     Past Surgical History:  Procedure Laterality Date   CARPAL TUNNEL RELEASE     IR PARACENTESIS  12/08/2020   MASTECTOMY      Social History:  reports that she has been smoking cigarettes. She has been smoking an average of 0.50 packs per day. She has never used smokeless tobacco. She reports that she does not drink alcohol and does not use drugs.  Allergies  Allergen Reactions   Clindamycin/Lincomycin Rash    Family History  Problem Relation Age of Onset   Lung cancer Father        smoker   Lung cancer Paternal Grandmother      Prior to Admission medications   Medication Sig Start Date End Date Taking? Authorizing Provider  abemaciclib (VERZENIO) 150 MG tablet Take 1 tablet (150 mg total) by mouth 2 (two) times daily. Swallow tablets whole. Do not chew, crush, or split tablets before swallowing. Start 12/16/2020 12/11/20   Magrinat, Virgie Dad, MD  acetaminophen (TYLENOL) 500 MG tablet Take 500 mg by mouth every 6 (six) hours as needed for moderate pain.    [provider]  albuterol (VENTOLIN HFA) 108 (90 Base) MCG/ACT inhaler Inhale 2 puffs into the lungs every 6 (six) hours as needed for wheezing or shortness of breath.    [provider]  anastrozole (ARIMIDEX) 1 MG tablet Take 1 tablet (  1 mg total) by mouth daily. 11/22/20   Little Ishikawa, MD  atorvastatin (LIPITOR) 20 MG tablet Take 20 mg by mouth daily. 11/16/20   [provider]  cholecalciferol (VITAMIN D3) 25 MCG (1000 UNIT) tablet Take 1,000 Units by mouth daily.    [provider]  famotidine (PEPCID) 20 MG tablet Take 20 mg by mouth daily. 12/17/20   [provider]  pantoprazole (PROTONIX) 20 MG tablet Take 20 mg by mouth daily.    [provider]  PARoxetine (PAXIL) 40 MG tablet Take 40 mg by mouth every  morning.    [provider]  prochlorperazine (COMPAZINE) 5 MG tablet Take 1 tablet (5 mg total) by mouth every 6 (six) hours as needed for nausea or vomiting. 01/06/21   Magrinat, Virgie Dad, MD    Physical Exam: Vitals:   01/07/21 2040 01/07/21 2130 01/07/21 2243 01/07/21 2330  BP:  121/73 108/60 114/73  Pulse: (!) 113 (!) 104 99 (!) 107  Resp: (!) 30 (!) 25 18 (!) 22  Temp:      TempSrc:      SpO2: 100% 100% 100% 95%   Constitutional: Chronically ill-appearing woman resting in bed, NAD, calm, comfortable Eyes: EOMI, lids and conjunctivae normal ENMT: Mucous membranes are moist. Posterior pharynx clear of any exudate or lesions.Normal dentition.  Neck: normal, supple, no masses. Respiratory: clear to auscultation bilaterally, no wheezing, no crackles. Normal respiratory effort while resting in bed. No accessory muscle use.  Cardiovascular: Tachycardic, systolic murmur present.  +1 right lower extremity and trace left lower extremity edema. 2+ pedal pulses. Abdomen: Soft distended abdomen, no tenderness to palpation, Bowel sounds positive.  Musculoskeletal: no clubbing / cyanosis. No joint deformity upper and lower extremities. Good ROM, no contractures. Normal muscle tone.  Skin: no rashes, lesions, ulcers. No induration Neurologic: CN 2-12 grossly intact. Sensation intact. Strength 5/5 in all 4.  Psychiatric: Normal judgment and insight. Alert and oriented x 3. Normal mood.   Labs on Admission: I have personally reviewed following labs and imaging studies  CBC: Recent Labs  Lab 01/07/21 2134  WBC 4.5  NEUTROABS 3.0  HGB 7.9*  HCT 26.2*  MCV 88.5  PLT 742   Basic Metabolic Panel: Recent Labs  Lab 01/07/21 2134  NA 133*  K 4.0  CL 100  CO2 24  GLUCOSE 92  BUN 25*  CREATININE 1.77*  CALCIUM 7.9*   GFR: Estimated Creatinine Clearance: 26.4 mL/min (A) (by C-G formula based on SCr of 1.77 mg/dL (H)). Liver Function Tests: Recent Labs  Lab 01/07/21 2134   AST 40  ALT 22  ALKPHOS 217*  BILITOT 0.1*  PROT 5.5*  ALBUMIN 1.7*   No results for input(s): LIPASE, AMYLASE in the last 168 hours. No results for input(s): AMMONIA in the last 168 hours. Coagulation Profile: No results for input(s): INR, PROTIME in the last 168 hours. Cardiac Enzymes: No results for input(s): CKTOTAL, CKMB, CKMBINDEX, TROPONINI in the last 168 hours. BNP (last 3 results) No results for input(s): PROBNP in the last 8760 hours. HbA1C: No results for input(s): HGBA1C in the last 72 hours. CBG: No results for input(s): GLUCAP in the last 168 hours. Lipid Profile: No results for input(s): CHOL, HDL, LDLCALC, TRIG, CHOLHDL, LDLDIRECT in the last 72 hours. Thyroid Function Tests: No results for input(s): TSH, T4TOTAL, FREET4, T3FREE, THYROIDAB in the last 72 hours. Anemia Panel: No results for input(s): VITAMINB12, FOLATE, FERRITIN, TIBC, IRON, RETICCTPCT in the last 72 hours. Urine  analysis:    Component Value Date/Time   COLORURINE YELLOW 04/16/2012 1637   APPEARANCEUR CLOUDY (A) 04/16/2012 1637   LABSPEC 1.022 04/16/2012 1637   PHURINE 6.0 04/16/2012 1637   GLUCOSEU NEGATIVE 04/16/2012 1637   HGBUR NEGATIVE 04/16/2012 1637   BILIRUBINUR NEGATIVE 04/16/2012 1637   KETONESUR NEGATIVE 04/16/2012 1637   PROTEINUR NEGATIVE 04/16/2012 1637   UROBILINOGEN 0.2 04/16/2012 1637   NITRITE NEGATIVE 04/16/2012 1637   LEUKOCYTESUR NEGATIVE 04/16/2012 1637    Radiological Exams on Admission: DG Abdomen Acute W/Chest  Result Date: 01/07/2021 CLINICAL DATA:  Shortness of breath and abdominal pain EXAM: DG ABDOMEN ACUTE WITH 1 VIEW CHEST COMPARISON:  12/29/2020 FINDINGS: Single-view chest demonstrates no focal opacity or pleural effusion. Normal cardiomediastinal silhouette. No pneumothorax. Supine and upright views of the abdomen demonstrate no free air beneath the diaphragm. Nonobstructed gas pattern with scattered colon gas. No radiopaque calculi. IMPRESSION: Negative  abdominal radiographs.  No acute cardiopulmonary disease. Electronically Signed   By: Donavan Foil M.D.   On: 01/07/2021 22:22    EKG: Personally reviewed. Sinus rhythm, rate 102, no acute changes.  Similar to prior.  Assessment/Plan Principal Problem:   Acute kidney injury superimposed on CKD [facility]) Active Problems:   Ascites   Malignant neoplasm of overlapping sites of left breast in female, estrogen receptor positive ([place])   Shortness of breath   Ermie S Mulcahey is a 60 y.o. female with medical history significant for metastatic breast cancer, CKD stage III, anemia of chronic disease, ascites, HLD, depression/anxiety who is admitted with dyspnea and recurrent ascites.  Shortness of breath: Experiencing dyspnea with minimal exertion.  This is felt related to decreased respirations related to ascites however given tachycardia and lower extremity edema, PE/DVT and/or CHF also on differential.  She is at increased risk for thromboembolism in setting of malignancy and medications (anastrozole, Verzenio).  CTA chest not obtained at this time given AKI on CKD.  She has not been hypoxic. -Obtain lower extremity Dopplers -Echocardiogram -Supplemental oxygen if needed  Recurrent ascites: Obtain abdominal ultrasound to assess ascites volume.  Follow with paracentesis if needed.  AKI on CKD stage III: Baseline creatinine 1.2-1.3, 1.77 on admission.  Check urine studies and abdominal ultrasound as above.  Metastatic breast cancer: Follows with oncology, Dr. Jana Hakim.  On current therapy with Verzenio and anastrozole.  Holding both for now.  Recent left pleural effusion: S/p thoracentesis yielding 600 mL pleural fluid on 6/13.  Cytology showed atypical cells, fluid culture was negative.  No evidence of recurrent pleural effusion on CXR.  Hyperlipidemia: Continue atorvastatin.  Anemia of chronic disease: Relatively stable without obvious bleeding.  Continue  monitor.  Depression/anxiety: Continue Paxil.  Tobacco use: Advised on smoking cessation.  Nicotine patch provided.  DVT prophylaxis: Lovenox Code Status: DNR, confirmed with patient on admission Family Communication: Discussed with patient, she has discussed with family Disposition Plan: From home, dispo pending clinical progress Consults called: None Level of care: Telemetry Admission status:  Status is: Observation  The patient remains OBS appropriate and will d/c before 2 midnights.  Dispo: The patient is from: Home              Anticipated d/c is to: Home              Patient currently is not medically stable to d/c.   Difficult to place patient No  Zada Finders MD [facility]  If 7PM-7AM, please contact night-coverage [email or web address]  01/08/2021, 12:20 AM

## 2021-01-08 NOTE — Progress Notes (Signed)
No charge note  60 year old female with history of metastatic breast cancer, chronic kidney disease stage III, anemia of chronic disease, ascites in the setting of metastatic disease, hyperlipidemia, depression who came to the hospital with shortness of breath, abdominal swelling.  Her abdominal swelling has been getting worse over the last week and she has been having worsening shortness of breath  She remains short of breath this morning, will obtain 2D echo.  Lower extremity Dopplers negative for DVT, D-dimer significantly elevated.  Obtain VQ scan  Recurrent ascites-order ultrasound-guided paracentesis.  Acute kidney injury on chronic kidney disease stage IIIa-Baseline creatinine 1.2-1.3, currently around 1.8.  Monitor, avoid nephrotoxins  Metastatic breast cancer-added Dr. Jana Hakim to treatment team.  Recent left pleural effusion-status post thoracentesis 6/13.  No evidence of recurrent pleural effusion on chest x-ray  Hyperlipidemia-continue statin  Anemia of chronic disease-stable, no bleeding  Lona Six M. Cruzita Lederer, MD, PhD [facility]  Between 7 am - 7 pm you can contact me via Amion (for emergencies) or [place] (non urgent matters).  I am not available 7 pm - 7 am, please contact night coverage MD/APP via Amion

## 2021-01-09 ENCOUNTER — Telehealth: Payer: Self-pay | Admitting: Pharmacist

## 2021-01-09 ENCOUNTER — Other Ambulatory Visit (HOSPITAL_COMMUNITY): Payer: Self-pay

## 2021-01-09 DIAGNOSIS — R180 Malignant ascites: Secondary | ICD-10-CM | POA: Diagnosis not present

## 2021-01-09 DIAGNOSIS — Z170 Estrogen receptor positive status [ER+]: Secondary | ICD-10-CM

## 2021-01-09 DIAGNOSIS — N189 Chronic kidney disease, unspecified: Secondary | ICD-10-CM | POA: Diagnosis not present

## 2021-01-09 DIAGNOSIS — N179 Acute kidney failure, unspecified: Secondary | ICD-10-CM | POA: Diagnosis not present

## 2021-01-09 DIAGNOSIS — C50812 Malignant neoplasm of overlapping sites of left female breast: Secondary | ICD-10-CM

## 2021-01-09 DIAGNOSIS — C50919 Malignant neoplasm of unspecified site of unspecified female breast: Secondary | ICD-10-CM

## 2021-01-09 LAB — CBC
HCT: 24.7 % — ABNORMAL LOW (ref 36.0–46.0)
Hemoglobin: 7.4 g/dL — ABNORMAL LOW (ref 12.0–15.0)
MCH: 26.6 pg (ref 26.0–34.0)
MCHC: 30 g/dL (ref 30.0–36.0)
MCV: 88.8 fL (ref 80.0–100.0)
Platelets: 242 10*3/uL (ref 150–400)
RBC: 2.78 MIL/uL — ABNORMAL LOW (ref 3.87–5.11)
RDW: 19.8 % — ABNORMAL HIGH (ref 11.5–15.5)
WBC: 4 10*3/uL (ref 4.0–10.5)
nRBC: 0 % (ref 0.0–0.2)

## 2021-01-09 LAB — COMPREHENSIVE METABOLIC PANEL
ALT: 19 U/L (ref 0–44)
AST: 30 U/L (ref 15–41)
Albumin: 1.9 g/dL — ABNORMAL LOW (ref 3.5–5.0)
Alkaline Phosphatase: 187 U/L — ABNORMAL HIGH (ref 38–126)
Anion gap: 5 (ref 5–15)
BUN: 22 mg/dL (ref 8–23)
CO2: 23 mmol/L (ref 22–32)
Calcium: 7.7 mg/dL — ABNORMAL LOW (ref 8.9–10.3)
Chloride: 105 mmol/L (ref 98–111)
Creatinine, Ser: 1.51 mg/dL — ABNORMAL HIGH (ref 0.44–1.00)
GFR, Estimated: 39 mL/min — ABNORMAL LOW (ref >60)
Glucose, Bld: 85 mg/dL (ref 70–99)
Potassium: 3.6 mmol/L (ref 3.5–5.1)
Sodium: 133 mmol/L — ABNORMAL LOW (ref 135–145)
Total Bilirubin: 0.4 mg/dL (ref 0.3–1.2)
Total Protein: 4.8 g/dL — ABNORMAL LOW (ref 6.5–8.1)

## 2021-01-09 LAB — CYTOLOGY - NON PAP

## 2021-01-09 LAB — PREPARE RBC (CROSSMATCH)

## 2021-01-09 MED ORDER — SODIUM CHLORIDE 0.9% IV SOLUTION: Freq: Once | INTRAVENOUS | Status: DC

## 2021-01-09 NOTE — Discharge Summary (Signed)
Physician Discharge Summary  Carolyn Barron HTD:[id number] DOB: November 09, 1960 DOA: 01/07/2021  PCP: [name], DO  Admit date: 01/07/2021 Discharge date: 01/09/2021  Admitted From: home Disposition:  home  Recommendations for Outpatient Follow-up:  Follow up with PCP in 1-2 weeks Please obtain BMP/CBC in one week Follow-up with Dr. Jana Hakim next week as scheduled  Home Health: none Equipment/Devices: none  Discharge Condition: stable CODE STATUS: DNR Diet recommendation: regular  HPI: Per admitting MD, Carolyn Barron is a 60 y.o. female with medical history significant for metastatic breast cancer, CKD stage III, anemia of chronic disease, ascites, HLD, depression/anxiety who presented to the ED for evaluation of shortness of breath and abdominal swelling. Patient states that she has developed recurrent abdominal swelling over the last week.  She has been having progressive dyspnea with minimal exertion and difficulty with deep inspiration.  She has had occasional diaphoresis but denies subjective fevers.  She has had nausea without emesis.  She says 2 days ago she had significant watery diarrhea which was attributed to her Verzenio oral chemotherapy.  She says she was started on medication to help with diarrhea and she has seen improvement with last bowel movement yesterday which appeared formed.  She has not had any chest pain, cough, dysuria.  She has seen some increased swelling in both of her legs, right greater than left, which is new. She is a current smoker, says she is down to 5-6 cigarettes/day.  Hospital Course / Discharge diagnoses: Principal problem Dyspnea-patient was admitted to the hospital with increased shortness of breath and weakness in the setting of recurrent ascites.  She underwent a paracentesis with 4.5 L of fluid removed, and received albumin.  Given elevation in the D-dimer underwent a VQ scan which was negative for PE.  A 2D echo showed EF 72-62%, grade 1 diastolic  dysfunction.  Lower extremity Doppler was negative for DVT.  Given improvement after paracentesis this was likely the main contributor to her dyspnea, she improved and will be discharged home in stable condition  Active problems Acute kidney injury on chronic kidney disease stage IIIa-Baseline creatinine 1.2-1.3, on admission it was around 1.8.  Improved to 1.5 on discharge Anemia of chronic disease, metastatic cancer-no evidence of bleeding, hemoglobin down to 7.4, also contributing to #1.  She will be transfused a unit of packed red blood cells prior to discharge.  Follow-up with oncology next week for repeat blood work Metastatic breast cancer-oncology saw patient while hospitalized, they stopped her abemaciclib and anastrozole, she will be started on Xeloda next week in office Recent left pleural effusion-status post thoracentesis 6/13.  No evidence of recurrent pleural effusion on chest x-ray Hyperlipidemia-continue statin  Sepsis ruled out   Discharge Instructions   Allergies as of 01/09/2021       Reactions   Clindamycin/lincomycin Rash        Medication List     STOP taking these medications    abemaciclib 150 MG tablet Commonly known as: Verzenio   anastrozole 1 MG tablet Commonly known as: ARIMIDEX       TAKE these medications    acetaminophen 500 MG tablet Commonly known as: TYLENOL Take 500 mg by mouth every 6 (six) hours as needed for moderate pain.   albuterol 108 (90 Base) MCG/ACT inhaler Commonly known as: VENTOLIN HFA Inhale 2 puffs into the lungs every 6 (six) hours as needed for wheezing or shortness of breath.   atorvastatin 20 MG tablet Commonly known as: LIPITOR Take 20  mg by mouth daily.   capecitabine 500 MG tablet Commonly known as: XELODA Take 3 tablets (1,500 mg total) by mouth 2 (two) times daily after a meal. T start 01/16/2021   cholecalciferol 25 MCG (1000 UNIT) tablet Commonly known as: VITAMIN D3 Take 1,000 Units by mouth  daily.   pantoprazole 20 MG tablet Commonly known as: PROTONIX Take 20 mg by mouth daily.   PARoxetine 40 MG tablet Commonly known as: PAXIL Take 40 mg by mouth every morning.   prochlorperazine 5 MG tablet Commonly known as: COMPAZINE Take 1 tablet (5 mg total) by mouth every 6 (six) hours as needed for nausea or vomiting.       Consultations: Oncology   Procedures/Studies:  CT Abdomen Pelvis Wo Contrast  Result Date: 12/25/2020 CLINICAL DATA:  Abdominal distension in a 60 year old female. History of breast cancer with peritoneal disease based on recent imaging. EXAM: CT ABDOMEN AND PELVIS WITHOUT CONTRAST TECHNIQUE: Multidetector CT imaging of the abdomen and pelvis was performed following the standard protocol without IV contrast. COMPARISON:  November 14, 2020. FINDINGS: Lower chest: Enlarging LEFT-sided pleural effusion as compared to recent imaging. Signs of coronary artery disease and aortic valvular calcification. LEFT-sided effusion is moderately large previously small to moderate. Hepatobiliary: No gross hepatic abnormality with similar smooth contours of the liver. Gallbladder is nondistended. Some cholelithiasis is suspected versus sludge layering dependently. Pancreas: Pancreas with similar contours.  No signs of inflammation. Spleen: Spleen normal size and contour. Adrenals/Urinary Tract: Adrenal glands are normal. Smooth renal contours. No hydronephrosis. Urinary bladder is collapsed amidst large volume abdominal ascites. Nephrolithiasis in the lower pole of the LEFT kidney. Mild fullness of LEFT intrarenal collecting systems is similar to the previous exam without frank hydronephrosis, this involves the lower pole of the LEFT kidney. Stomach/Bowel: Marked gastric thickening. Bowel displaced by ascites into the central abdomen. No pneumatosis. No bowel obstruction. Appendix is normal. Vascular/Lymphatic: Calcified atheromatous plaque of the abdominal aorta without aneurysmal  dilation. Upper abdominal adenopathy is suspected adjacent to the pancreatic head in the porta hepatis. This was seen on previous imaging not as well assessed on current examination. Estimate stability of these areas. LEFT retroperitoneal adenopathy (image 32/2) 1.6 cm short axis previously approximately 1.5 cm. Reproductive: Masslike appearance of the RIGHT ovary, soft tissue measuring approximately 6.3 cm greatest axial dimension along the RIGHT pelvic sidewall grossly stable compared to previous imaging. No LEFT adnexal mass. No pelvic lymphadenopathy. Cm Other: Large volume ascites may be increased since the previous study. Distance between abdominal wall in liver increased from 2.83.9 cm. There was quite a large volume of ascites on the prior exam as well. No pneumoperitoneum. Marked abdominal distension with grossly similar appearance. Musculoskeletal: Spinal degenerative changes and signs of diffuse skeletal metastatic disease with similar appearance. IMPRESSION: 1. Large volume ascites may be slightly increased since the previous study. 2. Enlarging LEFT-sided pleural effusion. 3. Signs of peritoneal disease and adenopathy with similar appearance. 4. Diffuse marked thickening of the stomach suspicious for metastatic involvement. 5. Nephrolithiasis. 6. 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 7. Aortic atherosclerosis. Aortic Atherosclerosis (ICD10-I70.0). Electronically Signed   By: Zetta Bills M.D.   On: 12/25/2020 16:47   DG Chest 1 View  Result Date: 12/29/2020 CLINICAL DATA:  Status post thoracentesis EXAM: CHEST  1 VIEW COMPARISON:  December 28, 2020 FINDINGS: No pneumothorax. There is no appreciable pleural effusion on the left. There is a small right pleural effusion with right base atelectasis. Lungs otherwise are clear. Heart  size and pulmonary vascularity are normal. No adenopathy. There are surgical clips in the left axilla. Patient is status post left mastectomy. IMPRESSION:  No pneumothorax. Left lung now clear. Small right pleural effusion with right base atelectasis. Right lung otherwise clear. Heart size normal. Status post mastectomy on the left. Electronically Signed   By: Lowella Grip III M.D.   On: 12/29/2020 12:49   DG Chest 2 View  Result Date: 12/25/2020 CLINICAL DATA:  Shortness of breath EXAM: CHEST - 2 VIEW COMPARISON:  October 11, 2012 FINDINGS: There is mild right base atelectasis with right pleural effusion. Lungs elsewhere clear. Heart size and pulmonary vascularity are normal. No adenopathy. Patient is status post left mastectomy with surgical clips in the left axillary region. IMPRESSION: Right pleural effusion with right base atelectasis. Lungs otherwise clear. Heart size and pulmonary vascularity normal. Status post left mastectomy. Electronically Signed   By: Lowella Grip III M.D.   On: 12/25/2020 15:31   NM Pulmonary Perfusion  Result Date: 01/08/2021 CLINICAL DATA:  PE suspected, low/intermediate prob, positive D-dimer Shortness of breath. Patient with history of metastatic breast cancer. EXAM: NUCLEAR MEDICINE PERFUSION LUNG SCAN TECHNIQUE: Perfusion images were obtained in multiple projections after intravenous injection of radiopharmaceutical. Ventilation scans intentionally deferred if perfusion scan and chest x-ray adequate for interpretation during COVID 19 epidemic. RADIOPHARMACEUTICALS:  4.4 mCi Tc-73m MAA IV COMPARISON:  Radiograph earlier today.  Chest CT 11/14/2020 reviewed FINDINGS: Mild heterogeneous pulmonary perfusion. There is small focus of increased uptake in the posterior left upper lobe. This is likely related to MAA aggregation and injection related, and not clinically significant. There is heterogeneous perfusion in the surrounding left upper lobe, but no discrete perfusion defects. IMPRESSION: 1. No scintigraphic evidence of pulmonary embolus. 2. Small focus of increased uptake in the posterior left upper lobe is likely  related to radiotracer aggregation and injection related, not clinically significant. Electronically Signed   By: Keith Rake M.D.   On: 01/08/2021 15:36   US Abdomen Complete  Result Date: 01/08/2021 CLINICAL DATA:  Acute kidney injury.  History of breast cancer. EXAM: ABDOMEN ULTRASOUND COMPLETE COMPARISON:  CT abdomen pelvis 12/25/2020 FINDINGS: Gallbladder: No gallstones or wall thickening visualized. No sonographic Murphy sign noted by sonographer. Common bile duct: Diameter: 4 mm. Liver: No focal lesion identified. Increased parenchymal echogenicity. Portal vein is patent on color Doppler imaging with normal direction of blood flow towards the liver. IVC: No abnormality visualized. Pancreas: Visualized portion unremarkable. Spleen: Size and appearance within normal limits. Right Kidney: Length: 10.3 cm. Echogenicity within normal limits. No mass or hydronephrosis visualized. Left Kidney: Length: 8.2 cm. Echogenicity within normal limits. No mass or hydronephrosis visualized. Abdominal aorta: No aneurysm visualized. Other findings: At least moderate volume simple free fluid ascites. At least trace left pleural effusion. IMPRESSION: 1. Hepatic steatosis. Please note limited evaluation for focal hepatic masses in a patient with hepatic steatosis due to decreased penetration of the acoustic ultrasound waves. 2. At least moderate volume free fluid ascites. 3. At least trace left pleural effusion. 4. Limited evaluation due to inability to hold breath. Electronically Signed   By: Iven Finn M.D.   On: 01/08/2021 03:53   US Paracentesis  Result Date: 01/08/2021 INDICATION: Patient with history of metastatic breast cancer, abdominal distension, and recurrent ascites. Request is made for diagnostic and therapeutic paracentesis. EXAM: ULTRASOUND GUIDED DIAGNOSTIC AND THERAPEUTIC PARACENTESIS MEDICATIONS: 15 mL 1% lidocaine COMPLICATIONS: None immediate. PROCEDURE: Informed written consent was obtained  from the patient after  a discussion of the risks, benefits and alternatives to treatment. A timeout was performed prior to the initiation of the procedure. Initial ultrasound scanning demonstrates a large amount of ascites within the left lower abdominal quadrant. The left lower abdomen was prepped and draped in the usual sterile fashion. 1% lidocaine was used for local anesthesia. Following this, a 19 gauge, 7-cm, Yueh catheter was introduced. An ultrasound image was saved for documentation purposes. The paracentesis was performed. The catheter was removed and a dressing was applied. The patient tolerated the procedure well without immediate post procedural complication. FINDINGS: A total of approximately 4.5 L of clear yellow fluid was removed. Samples were sent to the laboratory as requested by the clinical team. IMPRESSION: Successful ultrasound-guided paracentesis yielding 4.5 L of peritoneal fluid. Read by: Earley Abide, PA-C Electronically Signed   By: Sandi Mariscal M.D.   On: 01/08/2021 11:45   US Paracentesis  Result Date: 12/26/2020 INDICATION: Patient with a history of metastatic breast cancer and recurrent ascites presents today for therapeutic and diagnostic paracentesis. EXAM: ULTRASOUND GUIDED PARACENTESIS MEDICATIONS: 1% lidocaine 10 mL COMPLICATIONS: None immediate. PROCEDURE: Informed written consent was obtained from the patient after a discussion of the risks, benefits and alternatives to treatment. A timeout was performed prior to the initiation of the procedure. Initial ultrasound scanning demonstrates a large amount of ascites within the right lower abdominal quadrant. The right lower abdomen was prepped and draped in the usual sterile fashion. 1% lidocaine was used for local anesthesia. Following this, a 19 gauge, 7-cm, Yueh catheter was introduced. An ultrasound image was saved for documentation purposes. The paracentesis was performed. The catheter was removed and a dressing was  applied. The patient tolerated the procedure well without immediate post procedural complication. FINDINGS: A total of approximately 6.7 L of clear yellow fluid was removed. Samples were sent to the laboratory as requested by the clinical team. IMPRESSION: Successful ultrasound-guided paracentesis yielding 6.7 liters of peritoneal fluid. Read by: Soyla Dryer, NP Electronically Signed   By: Aletta Edouard M.D.   On: 12/26/2020 13:00   DG CHEST PORT 1 VIEW  Result Date: 01/08/2021 CLINICAL DATA:  Hypoxemia EXAM: PORTABLE CHEST 1 VIEW COMPARISON:  12/29/2020 FINDINGS: The heart size and mediastinal contours are within normal limits. Both lungs are clear. The visualized skeletal structures are unremarkable. IMPRESSION: No acute abnormality of the lungs in AP portable projection. Electronically Signed   By: Eddie Candle M.D.   On: 01/08/2021 13:19   DG CHEST PORT 1 VIEW  Result Date: 12/28/2020 CLINICAL DATA:  Breast cancer on chemotherapy. Hypertension. Vertigo. Recurrent paracenteses. EXAM: PORTABLE CHEST 1 VIEW COMPARISON:  12/25/2020 FINDINGS: Numerous leads and wires project over the chest. Left axillary node dissection. Left mastectomy. Midline trachea. Normal heart size and mediastinal contours. No pneumothorax. Small right pleural effusion is likely similar. Adjacent right greater than left base atelectasis. IMPRESSION: Similar small right pleural effusion with bibasilar atelectasis. Electronically Signed   By: Abigail Miyamoto M.D.   On: 12/28/2020 13:38   DG Abdomen Acute W/Chest  Result Date: 01/07/2021 CLINICAL DATA:  Shortness of breath and abdominal pain EXAM: DG ABDOMEN ACUTE WITH 1 VIEW CHEST COMPARISON:  12/29/2020 FINDINGS: Single-view chest demonstrates no focal opacity or pleural effusion. Normal cardiomediastinal silhouette. No pneumothorax. Supine and upright views of the abdomen demonstrate no free air beneath the diaphragm. Nonobstructed gas pattern with scattered colon gas. No  radiopaque calculi. IMPRESSION: Negative abdominal radiographs.  No acute cardiopulmonary disease. Electronically Signed   By: Maudie Mercury  Francoise Ceo M.D.   On: 01/07/2021 22:22   ECHOCARDIOGRAM COMPLETE  Result Date: 01/08/2021    ECHOCARDIOGRAM REPORT   Patient Name:   KATIRA DUMAIS Date of Exam: 01/08/2021 Medical Rec #:  [id number]    Height:       62.0 in Accession #:    [id number]   Weight:       131.4 lb Date of Birth:  December 18, 1960    BSA:          1.599 m Patient Age:    31 years     BP:           111/64 mmHg Patient Gender: F            HR:           98 bpm. Exam Location:  Inpatient Procedure: 2D Echo, Cardiac Doppler and Color Doppler Indications:    Dyspnea  History:        Patient has no prior history of Echocardiogram examinations.                 Signs/Symptoms:Dyspnea; Risk Factors:Dyslipidemia. Metastatic                 breast cancer, pleural effsuion, Ascites, CKD.  Sonographer:    Dustin Flock Referring Phys: [id number] [place]  1. Left ventricular ejection fraction, by estimation, is 70 to 75%. The left ventricle has hyperdynamic function. The left ventricle has no regional wall motion abnormalities. Left ventricular diastolic parameters are consistent with Grade I diastolic dysfunction (impaired relaxation). Elevated left atrial pressure.  2. Right ventricular systolic function is normal. The right ventricular size is normal. Tricuspid regurgitation signal is inadequate for assessing PA pressure.  3. The mitral valve is normal in structure. No evidence of mitral valve regurgitation. No evidence of mitral stenosis.  4. The aortic valve is tricuspid. Aortic valve regurgitation is not visualized. Mild aortic valve sclerosis is present, with no evidence of aortic valve stenosis.  5. The inferior vena cava is normal in size with greater than 50% respiratory variability, suggesting right atrial pressure of 3 mmHg. FINDINGS  Left Ventricle: Left ventricular ejection fraction, by  estimation, is 70 to 75%. The left ventricle has hyperdynamic function. The left ventricle has no regional wall motion abnormalities. The left ventricular internal cavity size was normal in size. There is no left ventricular hypertrophy. Left ventricular diastolic parameters are consistent with Grade I diastolic dysfunction (impaired relaxation). Elevated left atrial pressure. Right Ventricle: The right ventricular size is normal.Right ventricular systolic function is normal. Tricuspid regurgitation signal is inadequate for assessing PA pressure. The tricuspid regurgitant velocity is 2.40 m/s, and with an assumed right atrial pressure of 3 mmHg, the estimated right ventricular systolic pressure is 03.4 mmHg. Left Atrium: Left atrial size was normal in size. Right Atrium: Right atrial size was normal in size. Pericardium: There is no evidence of pericardial effusion. Mitral Valve: The mitral valve is normal in structure. No evidence of mitral valve regurgitation. No evidence of mitral valve stenosis. Tricuspid Valve: The tricuspid valve is normal in structure. Tricuspid valve regurgitation is trivial. No evidence of tricuspid stenosis. Aortic Valve: The aortic valve is tricuspid. Aortic valve regurgitation is not visualized. Mild aortic valve sclerosis is present, with no evidence of aortic valve stenosis. Aortic valve peak gradient measures 12.1 mmHg. Pulmonic Valve: The pulmonic valve was normal in structure. Pulmonic valve regurgitation is not visualized. No evidence of pulmonic stenosis. Aorta: The aortic root is normal  in size and structure. Venous: The inferior vena cava is normal in size with greater than 50% respiratory variability, suggesting right atrial pressure of 3 mmHg. IAS/Shunts: No atrial level shunt detected by color flow Doppler.  LEFT VENTRICLE PLAX 2D LVIDd:         3.80 cm  Diastology LVIDs:         2.00 cm  LV e' medial:    5.98 cm/s LV PW:         1.00 cm  LV E/e' medial:  17.7 LV IVS:         0.70 cm  LV e' lateral:   8.27 cm/s LVOT diam:     1.90 cm  LV E/e' lateral: 12.8 LV SV:         64 LV SV Index:   40 LVOT Area:     2.84 cm  RIGHT VENTRICLE RV Basal diam:  2.70 cm RV S prime:     18.70 cm/s TAPSE (M-mode): 2.0 cm LEFT ATRIUM             Index       RIGHT ATRIUM          Index LA diam:        3.00 cm 1.88 cm/m  RA Area:     8.21 cm LA Vol (A2C):   26.2 ml 16.38 ml/m RA Volume:   13.90 ml 8.69 ml/m LA Vol (A4C):   31.4 ml 19.64 ml/m LA Biplane Vol: 31.2 ml 19.51 ml/m  AORTIC VALVE AV Area (Vmax): 2.20 cm AV Vmax:        174.00 cm/s AV Peak Grad:   12.1 mmHg LVOT Vmax:      135.00 cm/s LVOT Vmean:     90.500 cm/s LVOT VTI:       0.225 m  AORTA Ao Root diam: 2.50 cm MITRAL VALVE                TRICUSPID VALVE MV Area (PHT): 6.22 cm     TR Peak grad:   23.0 mmHg MV Decel Time: 122 msec     TR Vmax:        240.00 cm/s MV E velocity: 106.00 cm/s MV A velocity: 97.90 cm/s   SHUNTS MV E/A ratio:  1.08         Systemic VTI:  0.22 m                             Systemic Diam: 1.90 cm Kirk Ruths MD Electronically signed by Kirk Ruths MD Signature Date/Time: 01/08/2021/3:18:53 PM    Final    VAS [place] LOWER EXTREMITY VENOUS (DVT)  Result Date: 01/08/2021  Lower Venous DVT Study Patient Name:  BRYANT LIPPS  Date of Exam:   01/08/2021 Medical Rec #: [id number]     Accession #:    [id number] Date of Birth: 06/14/1961     Patient Gender: F Patient Age:   60Y Exam Location:  [facility] Procedure:      VAS [place] LOWER EXTREMITY VENOUS (DVT) Referring Phys: [id number] VISHAL R PATEL --------------------------------------------------------------------------------  Indications: Edema.  Risk Factors: Chemotherapy Cancer - metastatic breast. Comparison Study: No previous exams Performing Technologist: Jody Hill RVT, RDMS  Examination Guidelines: A complete evaluation includes B-mode imaging, spectral Doppler, color Doppler, and power Doppler as needed of all accessible portions of each vessel. Bilateral  testing is considered an integral part of a complete examination.  Limited examinations for reoccurring indications may be performed as noted. The reflux portion of the exam is performed with the patient in reverse Trendelenburg.  +---------+---------------+---------+-----------+----------+--------------+ RIGHT    CompressibilityPhasicitySpontaneityPropertiesThrombus Aging +---------+---------------+---------+-----------+----------+--------------+ CFV      Full           Yes      Yes                                 +---------+---------------+---------+-----------+----------+--------------+ SFJ      Full                                                        +---------+---------------+---------+-----------+----------+--------------+ FV Prox  Full           Yes      Yes                                 +---------+---------------+---------+-----------+----------+--------------+ FV Mid   Full           Yes      Yes                                 +---------+---------------+---------+-----------+----------+--------------+ FV DistalFull           Yes      Yes                                 +---------+---------------+---------+-----------+----------+--------------+ PFV      Full                                                        +---------+---------------+---------+-----------+----------+--------------+ POP      Full           Yes      Yes                                 +---------+---------------+---------+-----------+----------+--------------+ PTV      Full                                                        +---------+---------------+---------+-----------+----------+--------------+ PERO     Full                                                        +---------+---------------+---------+-----------+----------+--------------+   +---------+---------------+---------+-----------+----------+--------------+ LEFT      CompressibilityPhasicitySpontaneityPropertiesThrombus Aging +---------+---------------+---------+-----------+----------+--------------+ CFV      Full           Yes      Yes                                 +---------+---------------+---------+-----------+----------+--------------+  SFJ      Full                                                        +---------+---------------+---------+-----------+----------+--------------+ FV Prox  Full           Yes      Yes                                 +---------+---------------+---------+-----------+----------+--------------+ FV Mid   Full           Yes      Yes                                 +---------+---------------+---------+-----------+----------+--------------+ FV DistalFull           Yes      Yes                                 +---------+---------------+---------+-----------+----------+--------------+ PFV      Full                                                        +---------+---------------+---------+-----------+----------+--------------+ POP      Full           Yes      Yes                                 +---------+---------------+---------+-----------+----------+--------------+ PTV      Full                                                        +---------+---------------+---------+-----------+----------+--------------+ PERO     Full                                                        +---------+---------------+---------+-----------+----------+--------------+     Summary: BILATERAL: - No evidence of deep vein thrombosis seen in the lower extremities, bilaterally. - No evidence of superficial venous thrombosis in the lower extremities, bilaterally. -No evidence of popliteal cyst, bilaterally. -Bilateral subcutaneous edema   *See table(s) above for measurements and observations. Electronically signed by Monica Martinez MD on 01/08/2021 at 12:44:50 PM.    Final    US THORACENTESIS ASP  PLEURAL SPACE W/IMG GUIDE  Result Date: 12/29/2020 INDICATION: Patient with history of breast cancer, recurrent ascites, dyspnea, and bilateral pleural effusions, left > right. Request is made for diagnostic and therapeutic left thoracentesis. EXAM: ULTRASOUND GUIDED DIAGNOSTIC AND THERAPEUTIC LEFT THORACENTESIS MEDICATIONS: 10 mL 1% lidocaine COMPLICATIONS: None immediate. PROCEDURE: An ultrasound guided thoracentesis was thoroughly discussed with the  patient and questions answered. The benefits, risks, alternatives and complications were also discussed. The patient understands and wishes to proceed with the procedure. Written consent was obtained. Ultrasound was performed to localize and mark an adequate pocket of fluid in the left chest. The area was then prepped and draped in the normal sterile fashion. 1% Lidocaine was used for local anesthesia. Under ultrasound guidance a 6 Fr Safe-T-Centesis catheter was introduced. Thoracentesis was performed. The catheter was removed and a dressing applied. FINDINGS: A total of approximately 600 mL of serosanguineous fluid was removed. Samples were sent to the laboratory as requested by the clinical team. IMPRESSION: Successful ultrasound guided left thoracentesis yielding 600 mL of pleural fluid. Read by: Earley Abide, PA-C Electronically Signed   By: Miachel Roux M.D.   On: 12/29/2020 13:54     Subjective: -Doing well, no complaints, eating breakfast  Discharge Exam: BP (!) 104/59 (BP Location: Right Arm)   Pulse 92   Temp 98.2 F (36.8 C) (Oral)   Resp 16   Wt 55.9 kg   SpO2 97%   BMI 22.54 kg/m   General: Pt is alert, awake, not in acute distress Cardiovascular: RRR, S1/S2 +, no rubs, no gallops Respiratory: CTA bilaterally, no wheezing, no rhonchi Abdominal: Soft, NT, ND, bowel sounds + Extremities: no edema, no cyanosis    The results of significant diagnostics from this hospitalization (including imaging, microbiology, ancillary and  laboratory) are listed below for reference.     Microbiology: Recent Results (from the past 240 hour(s))  Resp Panel by RT-PCR (Flu A&B, Covid) Nasopharyngeal Swab     Status: None   Collection Time: 01/08/21  7:49 AM   Specimen: Nasopharyngeal Swab; Nasopharyngeal(NP) swabs in vial transport medium  Result Value Ref Range Status   SARS Coronavirus 2 by RT PCR NEGATIVE NEGATIVE Final    Comment: (NOTE) SARS-CoV-2 target nucleic acids are NOT DETECTED.  The SARS-CoV-2 RNA is generally detectable in upper respiratory specimens during the acute phase of infection. The lowest concentration of SARS-CoV-2 viral copies this assay can detect is 138 copies/mL. A negative result does not preclude SARS-Cov-2 infection and should not be used as the sole basis for treatment or other patient management decisions. A negative result may occur with  improper specimen collection/handling, submission of specimen other than nasopharyngeal swab, presence of viral mutation(s) within the areas targeted by this assay, and inadequate number of viral copies(<138 copies/mL). A negative result must be combined with clinical observations, patient history, and epidemiological information. The expected result is Negative.  Fact Sheet for Patients:  EntrepreneurPulse.com.au  Fact Sheet for Healthcare Providers:  IncredibleEmployment.be  This test is no t yet approved or cleared by the [place] FDA and  has been authorized for detection and/or diagnosis of SARS-CoV-2 by FDA under an Emergency Use Authorization (EUA). This EUA will remain  in effect (meaning this test can be used) for the duration of the COVID-19 declaration under Section 564(b)(1) of the Act, 21 U.S.C.section 360bbb-3(b)(1), unless the authorization is terminated  or revoked sooner.       Influenza A by PCR NEGATIVE NEGATIVE Final   Influenza B by PCR NEGATIVE NEGATIVE Final    Comment: (NOTE) The  Xpert Xpress SARS-CoV-2/FLU/RSV plus assay is intended as an aid in the diagnosis of influenza from Nasopharyngeal swab specimens and should not be used as a sole basis for treatment. Nasal washings and aspirates are unacceptable for Xpert Xpress SARS-CoV-2/FLU/RSV testing.  Fact Sheet for Patients: EntrepreneurPulse.com.au  Fact  Sheet for Healthcare Providers: IncredibleEmployment.be  This test is not yet approved or cleared by the [place] and has been authorized for detection and/or diagnosis of SARS-CoV-2 by FDA under an Emergency Use Authorization (EUA). This EUA will remain in effect (meaning this test can be used) for the duration of the COVID-19 declaration under Section 564(b)(1) of the Act, 21 U.S.C. section 360bbb-3(b)(1), unless the authorization is terminated or revoked.  Performed at [facility], [place] [address], [place], [place] [address]      Labs: Basic Metabolic Panel: Recent Labs  Lab 01/07/21 2134 01/08/21 0400 01/09/21 0515  NA 133* 131* 133*  K 4.0 3.8 3.6  CL 100 102 105  CO2 24 24 23   GLUCOSE 92 99 85  BUN 25* 25* 22  CREATININE 1.77* 1.84* 1.51*  CALCIUM 7.9* 7.8* 7.7*   Liver Function Tests: Recent Labs  Lab 01/07/21 2134 01/08/21 0400 01/09/21 0515  AST 40 37 30  ALT 22 23 19   ALKPHOS 217* 217* 187*  BILITOT 0.1* 0.2* 0.4  PROT 5.5* 5.3* 4.8*  ALBUMIN 1.7* 1.7* 1.9*   CBC: Recent Labs  Lab 01/07/21 2134 01/08/21 0400 01/09/21 0515  WBC 4.5 4.3 4.0  NEUTROABS 3.0  --   --   HGB 7.9* 7.6* 7.4*  HCT 26.2* 25.0* 24.7*  MCV 88.5 88.0 88.8  PLT 260 244 242   CBG: No results for input(s): GLUCAP in the last 168 hours. Hgb A1c No results for input(s): HGBA1C in the last 72 hours. Lipid Profile No results for input(s): CHOL, HDL, LDLCALC, TRIG, CHOLHDL, LDLDIRECT in the last 72 hours. Thyroid function studies No results for input(s): TSH, T4TOTAL, T3FREE,  THYROIDAB in the last 72 hours.  Invalid input(s): FREET3 Urinalysis    Component Value Date/Time   COLORURINE YELLOW 04/16/2012 1637   APPEARANCEUR CLOUDY (A) 04/16/2012 1637   LABSPEC 1.022 04/16/2012 1637   PHURINE 6.0 04/16/2012 1637   GLUCOSEU NEGATIVE 04/16/2012 1637   HGBUR NEGATIVE 04/16/2012 1637   BILIRUBINUR NEGATIVE 04/16/2012 1637   KETONESUR NEGATIVE 04/16/2012 1637   PROTEINUR NEGATIVE 04/16/2012 1637   UROBILINOGEN 0.2 04/16/2012 1637   NITRITE NEGATIVE 04/16/2012 1637   LEUKOCYTESUR NEGATIVE 04/16/2012 1637    FURTHER DISCHARGE INSTRUCTIONS:   Get Medicines reviewed and adjusted: Please take all your medications with you for your next visit with your Primary MD   Laboratory/radiological data: Please request your Primary MD to go over all hospital tests and procedure/radiological results at the follow up, please ask your Primary MD to get all Hospital records sent to his/her office.   In some cases, they will be blood work, cultures and biopsy results pending at the time of your discharge. Please request that your primary care M.D. goes through all the records of your hospital data and follows up on these results.   Also Note the following: If you experience worsening of your admission symptoms, develop shortness of breath, life threatening emergency, suicidal or homicidal thoughts you must seek medical attention immediately by calling 911 or calling your MD immediately  if symptoms less severe.   You must read complete instructions/literature along with all the possible adverse reactions/side effects for all the Medicines you take and that have been prescribed to you. Take any new Medicines after you have completely understood and accpet all the possible adverse reactions/side effects.    Do not drive when taking Pain medications or sleeping medications (Benzodaizepines)   Do not take more than prescribed Pain,  Sleep and Anxiety Medications. It is not advisable  to combine anxiety,sleep and pain medications without talking with your primary care practitioner   Special Instructions: If you have smoked or chewed Tobacco  in the last 2 yrs please stop smoking, stop any regular Alcohol  and or any Recreational drug use.   Wear Seat belts while driving.   Please note: You were cared for by a hospitalist during your hospital stay. Once you are discharged, your primary care physician will handle any further medical issues. Please note that NO REFILLS for any discharge medications will be authorized once you are discharged, as it is imperative that you return to your primary care physician (or establish a relationship with a primary care physician if you do not have one) for your post hospital discharge needs so that they can reassess your need for medications and monitor your lab values.  Time coordinating discharge: 40 minutes  SIGNED:  Marzetta Board, MD, PhD 01/09/2021, 8:13 AM

## 2021-01-10 LAB — TYPE AND SCREEN
ABO/RH(D): A POS
Antibody Screen: NEGATIVE
Unit division: 0

## 2021-01-10 LAB — BPAM RBC
Blood Product Expiration Date: 202207112359
ISSUE DATE / TIME: 202206241309
Unit Type and Rh: 6200

## 2021-01-12 ENCOUNTER — Telehealth: Payer: Self-pay

## 2021-01-12 ENCOUNTER — Telehealth: Payer: Self-pay | Admitting: Oncology

## 2021-01-12 ENCOUNTER — Other Ambulatory Visit (HOSPITAL_COMMUNITY): Payer: Self-pay

## 2021-01-12 MED ORDER — CAPECITABINE 500 MG PO TABS
1000.0000 mg | ORAL_TABLET | Freq: Two times a day (BID) | ORAL | 6 refills | Status: DC
  Filled 2021-01-12: qty 56, 14d supply, fill #0

## 2021-01-12 MED ORDER — CAPECITABINE 500 MG PO TABS: 1000.0000 mg | ORAL_TABLET | Freq: Two times a day (BID) | ORAL | 6 refills | Status: DC

## 2021-01-12 NOTE — Telephone Encounter (Signed)
Scheduled appt per 6/23 sch msg. Called pt, no answer.Left msg with appt date and time.

## 2021-01-12 NOTE — Telephone Encounter (Signed)
Oral Oncology Patient Advocate Encounter  Prior Authorization for Xeloda has been approved.    PA# [id number] Effective dates: 01/12/21 through 01/11/22  Patient must fill with [facility] will continue to follow.   [place] Patient [place] Phone [phone] Fax [phone] 01/12/2021 3:39 PM

## 2021-01-12 NOTE — Telephone Encounter (Signed)
Oral Oncology Patient Advocate Encounter   Received notification from [employer] that prior authorization for Xeloda is required.   PA submitted on CoverMyMeds Key [id number] Status is pending   Oral Oncology Clinic will continue to follow.  Carolyn Barron Patient [name] Phone [phone] Fax [phone] 01/12/2021 8:35 AM

## 2021-01-12 NOTE — Telephone Encounter (Addendum)
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  Prescription dose and frequency assessed for appropriateness. Discussed with Dr. Jana Hakim - OK to decrease dose by 25% to account for patient's baseline renal dysfunction.   CBC and CMP from 01/09/21 assessed, patient with baseline renal dysfunction (Scr 1.51, CrCl ~34.5 mL/min) - dose of Xeloda has been renally dose adjusted.  Current medication list in Epic reviewed, DDIs with Xeloda identified: Category C DDI between Xeloda and pantoprazole - proton-pump inhibitors can decrease efficacy of Xeloda - if patient is able, will discuss with patient alternatives to pantoprazole, such as H2RA's like famotidine while on Xeloda.  Evaluated chart and no patient barriers to medication adherence noted.   Patient's insurance does not allow Rx to be filled through [facility]. Prescription redirected to [place] (now [place]).  Oral Oncology Clinic will continue to follow for insurance authorization, copayment issues, initial counseling and start date.  Leron Croak, PharmD, BCPS Hematology/Oncology Clinical Pharmacist [facility] [phone] 01/12/2021 7:56 AM

## 2021-01-14 ENCOUNTER — Inpatient Hospital Stay: Payer: 59

## 2021-01-14 ENCOUNTER — Inpatient Hospital Stay (HOSPITAL_BASED_OUTPATIENT_CLINIC_OR_DEPARTMENT_OTHER): Payer: 59 | Admitting: Oncology

## 2021-01-14 ENCOUNTER — Other Ambulatory Visit: Payer: Self-pay

## 2021-01-14 ENCOUNTER — Other Ambulatory Visit: Payer: Self-pay | Admitting: *Deleted

## 2021-01-14 VITALS — BP 106/61 | HR 115 | Temp 97.5°F | Resp 19 | Wt 129.1 lb

## 2021-01-14 DIAGNOSIS — R180 Malignant ascites: Secondary | ICD-10-CM

## 2021-01-14 DIAGNOSIS — C50919 Malignant neoplasm of unspecified site of unspecified female breast: Secondary | ICD-10-CM

## 2021-01-14 DIAGNOSIS — C50812 Malignant neoplasm of overlapping sites of left female breast: Secondary | ICD-10-CM

## 2021-01-14 DIAGNOSIS — C787 Secondary malignant neoplasm of liver and intrahepatic bile duct: Secondary | ICD-10-CM

## 2021-01-14 DIAGNOSIS — E538 Deficiency of other specified B group vitamins: Secondary | ICD-10-CM

## 2021-01-14 DIAGNOSIS — Z170 Estrogen receptor positive status [ER+]: Secondary | ICD-10-CM

## 2021-01-14 DIAGNOSIS — Z79811 Long term (current) use of aromatase inhibitors: Secondary | ICD-10-CM | POA: Diagnosis not present

## 2021-01-14 DIAGNOSIS — C7949 Secondary malignant neoplasm of other parts of nervous system: Secondary | ICD-10-CM | POA: Diagnosis not present

## 2021-01-14 DIAGNOSIS — Z79899 Other long term (current) drug therapy: Secondary | ICD-10-CM | POA: Diagnosis not present

## 2021-01-14 DIAGNOSIS — N9489 Other specified conditions associated with female genital organs and menstrual cycle: Secondary | ICD-10-CM

## 2021-01-14 DIAGNOSIS — M8580 Other specified disorders of bone density and structure, unspecified site: Secondary | ICD-10-CM | POA: Diagnosis not present

## 2021-01-14 DIAGNOSIS — R188 Other ascites: Secondary | ICD-10-CM | POA: Diagnosis not present

## 2021-01-14 LAB — CBC WITH DIFFERENTIAL (CANCER CENTER ONLY)
Abs Immature Granulocytes: 0.09 10*3/uL — ABNORMAL HIGH (ref 0.00–0.07)
Basophils Absolute: 0 10*3/uL (ref 0.0–0.1)
Basophils Relative: 0 %
Eosinophils Absolute: 0.1 10*3/uL (ref 0.0–0.5)
Eosinophils Relative: 1 %
HCT: 30.6 % — ABNORMAL LOW (ref 36.0–46.0)
Hemoglobin: 9.9 g/dL — ABNORMAL LOW (ref 12.0–15.0)
Immature Granulocytes: 2 %
Lymphocytes Relative: 30 %
Lymphs Abs: 1.4 10*3/uL (ref 0.7–4.0)
MCH: 28.1 pg (ref 26.0–34.0)
MCHC: 32.4 g/dL (ref 30.0–36.0)
MCV: 86.9 fL (ref 80.0–100.0)
Monocytes Absolute: 0.4 10*3/uL (ref 0.1–1.0)
Monocytes Relative: 8 %
Neutro Abs: 2.6 10*3/uL (ref 1.7–7.7)
Neutrophils Relative %: 59 %
Platelet Count: 189 10*3/uL (ref 150–400)
RBC: 3.52 MIL/uL — ABNORMAL LOW (ref 3.87–5.11)
RDW: 19.5 % — ABNORMAL HIGH (ref 11.5–15.5)
WBC Count: 4.6 10*3/uL (ref 4.0–10.5)
nRBC: 0 % (ref 0.0–0.2)

## 2021-01-14 LAB — CMP (CANCER CENTER ONLY)
ALT: 30 U/L (ref 0–44)
AST: 50 U/L — ABNORMAL HIGH (ref 15–41)
Albumin: 1.6 g/dL — ABNORMAL LOW (ref 3.5–5.0)
Alkaline Phosphatase: 291 U/L — ABNORMAL HIGH (ref 38–126)
Anion gap: 10 (ref 5–15)
BUN: 23 mg/dL (ref 8–23)
CO2: 21 mmol/L — ABNORMAL LOW (ref 22–32)
Calcium: 7.5 mg/dL — ABNORMAL LOW (ref 8.9–10.3)
Chloride: 101 mmol/L (ref 98–111)
Creatinine: 1.37 mg/dL — ABNORMAL HIGH (ref 0.44–1.00)
GFR, Estimated: 44 mL/min — ABNORMAL LOW (ref >60)
Glucose, Bld: 104 mg/dL — ABNORMAL HIGH (ref 70–99)
Potassium: 5.1 mmol/L (ref 3.5–5.1)
Sodium: 132 mmol/L — ABNORMAL LOW (ref 135–145)
Total Bilirubin: <0.2 mg/dL — ABNORMAL LOW (ref 0.3–1.2)
Total Protein: 5.4 g/dL — ABNORMAL LOW (ref 6.5–8.1)

## 2021-01-14 NOTE — Telephone Encounter (Signed)
Oral Chemotherapy Pharmacist Encounter   Called [facility] to check on status of new prescription for Xeloda (capecitabine) for Carolyn Barron. Was informed by representative that it is in the last phase of processing before they reach out to patient. Requested that medication be expedited since goal is for patient to start 01/16/21. Representative documented this in patient's profile and said patient should hear from pharmacy within the next 24 hours.   Leron Croak, PharmD, BCPS Hematology/Oncology Clinical Pharmacist [facility] [phone] 01/14/2021 10:06 AM

## 2021-01-14 NOTE — Progress Notes (Addendum)
ID: Carolyn Barron   DOB: Dec 05, 1960  MR#: [id number]  CSN#:[id number]  Patient Care Team: [name], DO as PCP - General (Family Medicine) Consuella Lose, MD as Consulting Physician (Neurosurgery) Eppie Gibson, MD as Consulting Physician (Radiation Oncology) Duard Spiewak, Virgie Dad, MD as Consulting Physician (Oncology) OTHER MD:  CHIEF COMPLAINT: now metastatic cancer (s/p remote left mastectomy)  CURRENT TREATMENT: to start capecitabine   INTERVAL HISTORY: Carolyn Barron returns today for follow up of her now metastatic breast cancer.  She is accompanied by her daughter Carolyn Barron  Since her last visit, Carolyn Barron was admitted with increased dyspnea and increased ascites.  She was discharged from the hospital of 6/14 after paracentesis and pleural effusion requiring thoracentesis.  Cytology from these procedures was again negative   During her admission, she underwent a paracentesis yielding 4.5 L of fluids, bilateral lower extremity venous doppler which ruled out DVT, chest xray which was negative, VQ scan and echocardiogram which were both unrevealing.  She is scheduled to switch to capecitabine on 01/16/2021.  She has not yet obtained a medication  Her tumor markers have not shown a consistent favorable trend Lab Results  Component Value Date   CA2729 165.4 (H) 12/16/2020   CA2729 143.6 (H) 12/04/2020   CA2729 151.8 (H) 11/21/2020   Lab Results  Component Value Date   CAN125 904.0 (H) 12/04/2020   WKM628 1,108.0 (H) 11/21/2020    REVIEW OF SYSTEMS: Carolyn Barron feels very short of breath.  As noted above she was recently evaluated for PE in does not seem to be the case.  She was transfused with hemoglobin of 9.9 and she does say that helped some.  Amire is depressed and was tearful today.  She still has visual issues but these are not worse than before.  There have been no recent falls, no evidence of seizure, no unusual headaches, no nausea or vomiting.  She has lost quite a bit of weight.  A detailed  review of systems was otherwise stable   COVID 19 VACCINATION STATUS: Status post vaccine x2   BREAST CANCER HISTORY: From the original intake note:  Carolyn Barron felt a lump in her left breast after a shower in late October. She brought it to Dr. Eugenio Hoes attention, and was set up for mammography November 1 at the [facility].  Dr. Sadie Haber was able to feel some thickening at 1 o'clock in the left breast about 5 cm from the left nipple, and by mammography there was a spiculated mass there corresponding to the palpable finding.  Ultrasound showed this to be irregular, and to measure approximately 1.6 cm.  The left axilla appeared normal.  Biopsy was performed the same day, and showed ([id number]) an invasive mammary carcinoma with lobular features, which was ER+ at 100%, PR+ at 99%, with a low proliferation marker at 10%, and HER2 negative with a ratio of 1.13.   With this information, the patient was referred to Dr. Brantley Stage and breast MRI was obtained November 3.  This showed a large area of non-mass enhancement within the upper-outer and upper-inner quadrants of the left breast, much larger than the abnormality found by physical examination, ultrasound or mammography. The question was then raised whether to proceed to biopsy of the edges of this mass to ascertain for respectability, or to proceed directly to mastectomy, and the patient much preferred the latter, so mastectomy was performed November 12 with sentinel lymph node biopsy, the final pathology showing ([id number]) a 1.8 cm invasive lobular carcinoma, grade  2, with no evidence of lymphovascular invasion and ample margins.  The sentinel lymph node was negative.   Her subsequent history is as detailed below.   PAST MEDICAL HISTORY: Past Medical History:  Diagnosis Date   Breast cancer ([place])    Cancer ([place])    Dyspnea    Hypertension    Vertigo   The past medical history is significant for hyperlipidemia, remote history of kidney  stones, history of asthma, history of osteopenia, history of carpal tunnel repair, history of trigger finger repair (both these two by Dr. Daylene Katayama), and history of continuing tobacco abuse.     PAST SURGICAL HISTORY: Past Surgical History:  Procedure Laterality Date   CARPAL TUNNEL RELEASE     IR PARACENTESIS  12/08/2020   MASTECTOMY      FAMILY HISTORY Family History  Problem Relation Age of Onset   Lung cancer Father        smoker   Lung cancer Paternal Grandmother   The patient's father died from lung cancer.  He was [facility] at [facility]  He was a smoker.  The patient's mother is in fair health (age 17 as of March 2016).  The patient has one sister. The only cancer that she knows of in the family is the patient's father's mother's, who had lung cancer. (The patient is a distant cousin of Marja Kays, who of course died from breast cancer.)    GYNECOLOGIC HISTORY: She is GX P1, first pregnancy to term at age 53.  Last menstrual period was 2005.  She never took hormone replacement.    SOCIAL HISTORY: (updated June 2022) She worked for a company that [employer].    Her husband, Carolyn Barron, died suddenly 04-10-2013.  The patient's daughter, Carolyn Barron, is currently living in [place].  Her husband Carolyn Barron is in the [employer].  They have 5 children.  The patient attends the [employer] is one of the pastors and Grafton Folk is also working there).      ADVANCED DIRECTIVES: Not in place   HEALTH MAINTENANCE: Social History   Tobacco Use   Smoking status: Some Days    Packs/day: 0.50    Pack years: 0.00    Types: Cigarettes   Smokeless tobacco: Never  Vaping Use   Vaping Use: Never used  Substance Use Topics   Alcohol use: No   Drug use: No     Colonoscopy:  PAP:  Bone density:  Lipid panel:  Allergies  Allergen Reactions   Clindamycin/Lincomycin Rash    Current Outpatient Medications  Medication Sig Dispense Refill    acetaminophen (TYLENOL) 500 MG tablet Take 500 mg by mouth every 6 (six) hours as needed for moderate pain.     albuterol (VENTOLIN HFA) 108 (90 Base) MCG/ACT inhaler Inhale 2 puffs into the lungs every 6 (six) hours as needed for wheezing or shortness of breath.     atorvastatin (LIPITOR) 20 MG tablet Take 20 mg by mouth daily.     capecitabine (XELODA) 500 MG tablet Take 2 tablets (1,000 mg total) by mouth 2 (two) times daily after a meal. Take for 14 days on, 7 days off. Repeat every 21 days. To start 01/16/2021 56 tablet 6   cholecalciferol (VITAMIN D3) 25 MCG (1000 UNIT) tablet Take 1,000 Units by mouth daily.     pantoprazole (PROTONIX) 20 MG tablet Take 20 mg by mouth daily.     PARoxetine (PAXIL) 40 MG tablet Take 40  mg by mouth every morning.     prochlorperazine (COMPAZINE) 5 MG tablet Take 1 tablet (5 mg total) by mouth every 6 (six) hours as needed for nausea or vomiting. 30 tablet 0   No current facility-administered medications for this visit.    OBJECTIVE: White woman who appears chronically ill Vitals:   01/14/21 1606  BP: 106/61  Pulse: (!) 115  Resp: 19  Temp: (!) 97.5 F (36.4 C)  SpO2: 100%      Body mass index is 23.61 kg/m.    ECOG FS: 1  Left ptosis as previously noted No cervical or supraclavicular adenopathy Lungs no rales or rhonchi Heart regular rate and rhythm Abd soft, nontender, positive bowel sounds MSK no focal spinal tenderness Neuro: nonfocal, well oriented, depressed affect Breasts: Deferred   LAB RESULTS: Lab Results  Component Value Date   WBC 4.6 01/14/2021   NEUTROABS 2.6 01/14/2021   HGB 9.9 (L) 01/14/2021   HCT 30.6 (L) 01/14/2021   MCV 86.9 01/14/2021   PLT 189 01/14/2021      Chemistry      Component Value Date/Time   NA 132 (L) 01/14/2021 1558   NA 142 09/25/2014 1532   K 5.1 01/14/2021 1558   K 4.2 09/25/2014 1532   CL 101 01/14/2021 1558   CO2 21 (L) 01/14/2021 1558   CO2 27 09/25/2014 1532   BUN 23 01/14/2021  1558   BUN 14.1 09/25/2014 1532   CREATININE 1.37 (H) 01/14/2021 1558   CREATININE 0.9 09/25/2014 1532      Component Value Date/Time   CALCIUM 7.5 (L) 01/14/2021 1558   CALCIUM 9.4 09/25/2014 1532   ALKPHOS 291 (H) 01/14/2021 1558   ALKPHOS 106 09/25/2014 1532   AST 50 (H) 01/14/2021 1558   AST 11 09/25/2014 1532   ALT 30 01/14/2021 1558   ALT 9 09/25/2014 1532   BILITOT <0.2 (L) 01/14/2021 1558   BILITOT <0.20 09/25/2014 1532       Lab Results  Component Value Date   LABCA2 14 03/19/2010    No components found for: LABCA125  Recent Labs  Lab 01/08/21 0400  INR 1.1    Urinalysis    Component Value Date/Time   COLORURINE YELLOW 04/16/2012 1637   APPEARANCEUR CLOUDY (A) 04/16/2012 1637   LABSPEC 1.022 04/16/2012 1637   PHURINE 6.0 04/16/2012 1637   GLUCOSEU NEGATIVE 04/16/2012 1637   HGBUR NEGATIVE 04/16/2012 1637   [place] 04/16/2012 1637   KETONESUR NEGATIVE 04/16/2012 1637   PROTEINUR NEGATIVE 04/16/2012 1637   UROBILINOGEN 0.2 04/16/2012 1637   NITRITE NEGATIVE 04/16/2012 1637   LEUKOCYTESUR NEGATIVE 04/16/2012 1637    STUDIES: CT Abdomen Pelvis Wo Contrast  Result Date: 12/25/2020 CLINICAL DATA:  Abdominal distension in a 60 year old female. History of breast cancer with peritoneal disease based on recent imaging. EXAM: CT ABDOMEN AND PELVIS WITHOUT CONTRAST TECHNIQUE: Multidetector CT imaging of the abdomen and pelvis was performed following the standard protocol without IV contrast. COMPARISON:  November 14, 2020. FINDINGS: Lower chest: Enlarging LEFT-sided pleural effusion as compared to recent imaging. Signs of coronary artery disease and aortic valvular calcification. LEFT-sided effusion is moderately large previously small to moderate. Hepatobiliary: No gross hepatic abnormality with similar smooth contours of the liver. Gallbladder is nondistended. Some cholelithiasis is suspected versus sludge layering dependently. Pancreas: Pancreas with  similar contours.  No signs of inflammation. Spleen: Spleen normal size and contour. Adrenals/Urinary Tract: Adrenal glands are normal. Smooth renal contours. No hydronephrosis. Urinary bladder is collapsed  amidst large volume abdominal ascites. Nephrolithiasis in the lower pole of the LEFT kidney. Mild fullness of LEFT intrarenal collecting systems is similar to the previous exam without frank hydronephrosis, this involves the lower pole of the LEFT kidney. Stomach/Bowel: Marked gastric thickening. Bowel displaced by ascites into the central abdomen. No pneumatosis. No bowel obstruction. Appendix is normal. Vascular/Lymphatic: Calcified atheromatous plaque of the abdominal aorta without aneurysmal dilation. Upper abdominal adenopathy is suspected adjacent to the pancreatic head in the porta hepatis. This was seen on previous imaging not as well assessed on current examination. Estimate stability of these areas. LEFT retroperitoneal adenopathy (image 32/2) 1.6 cm short axis previously approximately 1.5 cm. Reproductive: Masslike appearance of the RIGHT ovary, soft tissue measuring approximately 6.3 cm greatest axial dimension along the RIGHT pelvic sidewall grossly stable compared to previous imaging. No LEFT adnexal mass. No pelvic lymphadenopathy. Cm Other: Large volume ascites may be increased since the previous study. Distance between abdominal wall in liver increased from 2.83.9 cm. There was quite a large volume of ascites on the prior exam as well. No pneumoperitoneum. Marked abdominal distension with grossly similar appearance. Musculoskeletal: Spinal degenerative changes and signs of diffuse skeletal metastatic disease with similar appearance. IMPRESSION: 1. Large volume ascites may be slightly increased since the previous study. 2. Enlarging LEFT-sided pleural effusion. 3. Signs of peritoneal disease and adenopathy with similar appearance. 4. Diffuse marked thickening of the stomach suspicious for  metastatic involvement. 5. Nephrolithiasis. 6. 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 7. Aortic atherosclerosis. Aortic Atherosclerosis (ICD10-I70.0). Electronically Signed   By: Zetta Bills M.D.   On: 12/25/2020 16:47   DG Chest 1 View  Result Date: 12/29/2020 CLINICAL DATA:  Status post thoracentesis EXAM: CHEST  1 VIEW COMPARISON:  December 28, 2020 FINDINGS: No pneumothorax. There is no appreciable pleural effusion on the left. There is a small right pleural effusion with right base atelectasis. Lungs otherwise are clear. Heart size and pulmonary vascularity are normal. No adenopathy. There are surgical clips in the left axilla. Patient is status post left mastectomy. IMPRESSION: No pneumothorax. Left lung now clear. Small right pleural effusion with right base atelectasis. Right lung otherwise clear. Heart size normal. Status post mastectomy on the left. Electronically Signed   By: Lowella Grip III M.D.   On: 12/29/2020 12:49   DG Chest 2 View  Result Date: 12/25/2020 CLINICAL DATA:  Shortness of breath EXAM: CHEST - 2 VIEW COMPARISON:  October 11, 2012 FINDINGS: There is mild right base atelectasis with right pleural effusion. Lungs elsewhere clear. Heart size and pulmonary vascularity are normal. No adenopathy. Patient is status post left mastectomy with surgical clips in the left axillary region. IMPRESSION: Right pleural effusion with right base atelectasis. Lungs otherwise clear. Heart size and pulmonary vascularity normal. Status post left mastectomy. Electronically Signed   By: Lowella Grip III M.D.   On: 12/25/2020 15:31   NM Pulmonary Perfusion  Result Date: 01/08/2021 CLINICAL DATA:  PE suspected, low/intermediate prob, positive D-dimer Shortness of breath. Patient with history of metastatic breast cancer. EXAM: NUCLEAR MEDICINE PERFUSION LUNG SCAN TECHNIQUE: Perfusion images were obtained in multiple projections after intravenous injection of radiopharmaceutical.  Ventilation scans intentionally deferred if perfusion scan and chest x-ray adequate for interpretation during COVID 19 epidemic. RADIOPHARMACEUTICALS:  4.4 mCi Tc-21m MAA IV COMPARISON:  Radiograph earlier today.  Chest CT 11/14/2020 reviewed FINDINGS: Mild heterogeneous pulmonary perfusion. There is small focus of increased uptake in the posterior left upper lobe. This is likely related  to MAA aggregation and injection related, and not clinically significant. There is heterogeneous perfusion in the surrounding left upper lobe, but no discrete perfusion defects. IMPRESSION: 1. No scintigraphic evidence of pulmonary embolus. 2. Small focus of increased uptake in the posterior left upper lobe is likely related to radiotracer aggregation and injection related, not clinically significant. Electronically Signed   By: Keith Rake M.D.   On: 01/08/2021 15:36   US Abdomen Complete  Result Date: 01/08/2021 CLINICAL DATA:  Acute kidney injury.  History of breast cancer. EXAM: ABDOMEN ULTRASOUND COMPLETE COMPARISON:  CT abdomen pelvis 12/25/2020 FINDINGS: Gallbladder: No gallstones or wall thickening visualized. No sonographic Murphy sign noted by sonographer. Common bile duct: Diameter: 4 mm. Liver: No focal lesion identified. Increased parenchymal echogenicity. Portal vein is patent on color Doppler imaging with normal direction of blood flow towards the liver. IVC: No abnormality visualized. Pancreas: Visualized portion unremarkable. Spleen: Size and appearance within normal limits. Right Kidney: Length: 10.3 cm. Echogenicity within normal limits. No mass or hydronephrosis visualized. Left Kidney: Length: 8.2 cm. Echogenicity within normal limits. No mass or hydronephrosis visualized. Abdominal aorta: No aneurysm visualized. Other findings: At least moderate volume simple free fluid ascites. At least trace left pleural effusion. IMPRESSION: 1. Hepatic steatosis. Please note limited evaluation for focal hepatic  masses in a patient with hepatic steatosis due to decreased penetration of the acoustic ultrasound waves. 2. At least moderate volume free fluid ascites. 3. At least trace left pleural effusion. 4. Limited evaluation due to inability to hold breath. Electronically Signed   By: Iven Finn M.D.   On: 01/08/2021 03:53   US Paracentesis  Result Date: 01/08/2021 INDICATION: Patient with history of metastatic breast cancer, abdominal distension, and recurrent ascites. Request is made for diagnostic and therapeutic paracentesis. EXAM: ULTRASOUND GUIDED DIAGNOSTIC AND THERAPEUTIC PARACENTESIS MEDICATIONS: 15 mL 1% lidocaine COMPLICATIONS: None immediate. PROCEDURE: Informed written consent was obtained from the patient after a discussion of the risks, benefits and alternatives to treatment. A timeout was performed prior to the initiation of the procedure. Initial ultrasound scanning demonstrates a large amount of ascites within the left lower abdominal quadrant. The left lower abdomen was prepped and draped in the usual sterile fashion. 1% lidocaine was used for local anesthesia. Following this, a 19 gauge, 7-cm, Yueh catheter was introduced. An ultrasound image was saved for documentation purposes. The paracentesis was performed. The catheter was removed and a dressing was applied. The patient tolerated the procedure well without immediate post procedural complication. FINDINGS: A total of approximately 4.5 L of clear yellow fluid was removed. Samples were sent to the laboratory as requested by the clinical team. IMPRESSION: Successful ultrasound-guided paracentesis yielding 4.5 L of peritoneal fluid. Read by: Earley Abide, PA-C Electronically Signed   By: Sandi Mariscal M.D.   On: 01/08/2021 11:45   US Paracentesis  Result Date: 12/26/2020 INDICATION: Patient with a history of metastatic breast cancer and recurrent ascites presents today for therapeutic and diagnostic paracentesis. EXAM: ULTRASOUND GUIDED  PARACENTESIS MEDICATIONS: 1% lidocaine 10 mL COMPLICATIONS: None immediate. PROCEDURE: Informed written consent was obtained from the patient after a discussion of the risks, benefits and alternatives to treatment. A timeout was performed prior to the initiation of the procedure. Initial ultrasound scanning demonstrates a large amount of ascites within the right lower abdominal quadrant. The right lower abdomen was prepped and draped in the usual sterile fashion. 1% lidocaine was used for local anesthesia. Following this, a 19 gauge, 7-cm, Yueh catheter was introduced. An  ultrasound image was saved for documentation purposes. The paracentesis was performed. The catheter was removed and a dressing was applied. The patient tolerated the procedure well without immediate post procedural complication. FINDINGS: A total of approximately 6.7 L of clear yellow fluid was removed. Samples were sent to the laboratory as requested by the clinical team. IMPRESSION: Successful ultrasound-guided paracentesis yielding 6.7 liters of peritoneal fluid. Read by: Soyla Dryer, NP Electronically Signed   By: Aletta Edouard M.D.   On: 12/26/2020 13:00   DG CHEST PORT 1 VIEW  Result Date: 01/08/2021 CLINICAL DATA:  Hypoxemia EXAM: PORTABLE CHEST 1 VIEW COMPARISON:  12/29/2020 FINDINGS: The heart size and mediastinal contours are within normal limits. Both lungs are clear. The visualized skeletal structures are unremarkable. IMPRESSION: No acute abnormality of the lungs in AP portable projection. Electronically Signed   By: Eddie Candle M.D.   On: 01/08/2021 13:19   DG CHEST PORT 1 VIEW  Result Date: 12/28/2020 CLINICAL DATA:  Breast cancer on chemotherapy. Hypertension. Vertigo. Recurrent paracenteses. EXAM: PORTABLE CHEST 1 VIEW COMPARISON:  12/25/2020 FINDINGS: Numerous leads and wires project over the chest. Left axillary node dissection. Left mastectomy. Midline trachea. Normal heart size and mediastinal contours. No  pneumothorax. Small right pleural effusion is likely similar. Adjacent right greater than left base atelectasis. IMPRESSION: Similar small right pleural effusion with bibasilar atelectasis. Electronically Signed   By: Abigail Miyamoto M.D.   On: 12/28/2020 13:38   DG Abdomen Acute W/Chest  Result Date: 01/07/2021 CLINICAL DATA:  Shortness of breath and abdominal pain EXAM: DG ABDOMEN ACUTE WITH 1 VIEW CHEST COMPARISON:  12/29/2020 FINDINGS: Single-view chest demonstrates no focal opacity or pleural effusion. Normal cardiomediastinal silhouette. No pneumothorax. Supine and upright views of the abdomen demonstrate no free air beneath the diaphragm. Nonobstructed gas pattern with scattered colon gas. No radiopaque calculi. IMPRESSION: Negative abdominal radiographs.  No acute cardiopulmonary disease. Electronically Signed   By: Donavan Foil M.D.   On: 01/07/2021 22:22   ECHOCARDIOGRAM COMPLETE  Result Date: 01/08/2021    ECHOCARDIOGRAM REPORT   Patient Name:   IVEY NEMBHARD Date of Exam: 01/08/2021 Medical Rec #:  [id number]    Height:       62.0 in Accession #:    [id number]   Weight:       131.4 lb Date of Birth:  28-Jul-1960    BSA:          1.599 m Patient Age:    60 years     BP:           111/64 mmHg Patient Gender: F            HR:           98 bpm. Exam Location:  Inpatient Procedure: 2D Echo, Cardiac Doppler and Color Doppler Indications:    Dyspnea  History:        Patient has no prior history of Echocardiogram examinations.                 Signs/Symptoms:Dyspnea; Risk Factors:Dyslipidemia. Metastatic                 breast cancer, pleural effsuion, Ascites, CKD.  Sonographer:    Dustin Flock Referring Phys: [id number] [place]  1. Left ventricular ejection fraction, by estimation, is 70 to 75%. The left ventricle has hyperdynamic function. The left ventricle has no regional wall motion abnormalities. Left ventricular diastolic parameters are consistent with Grade I diastolic dysfunction  (impaired  relaxation). Elevated left atrial pressure.  2. Right ventricular systolic function is normal. The right ventricular size is normal. Tricuspid regurgitation signal is inadequate for assessing PA pressure.  3. The mitral valve is normal in structure. No evidence of mitral valve regurgitation. No evidence of mitral stenosis.  4. The aortic valve is tricuspid. Aortic valve regurgitation is not visualized. Mild aortic valve sclerosis is present, with no evidence of aortic valve stenosis.  5. The inferior vena cava is normal in size with greater than 50% respiratory variability, suggesting right atrial pressure of 3 mmHg. FINDINGS  Left Ventricle: Left ventricular ejection fraction, by estimation, is 70 to 75%. The left ventricle has hyperdynamic function. The left ventricle has no regional wall motion abnormalities. The left ventricular internal cavity size was normal in size. There is no left ventricular hypertrophy. Left ventricular diastolic parameters are consistent with Grade I diastolic dysfunction (impaired relaxation). Elevated left atrial pressure. Right Ventricle: The right ventricular size is normal.Right ventricular systolic function is normal. Tricuspid regurgitation signal is inadequate for assessing PA pressure. The tricuspid regurgitant velocity is 2.40 m/s, and with an assumed right atrial pressure of 3 mmHg, the estimated right ventricular systolic pressure is 04.8 mmHg. Left Atrium: Left atrial size was normal in size. Right Atrium: Right atrial size was normal in size. Pericardium: There is no evidence of pericardial effusion. Mitral Valve: The mitral valve is normal in structure. No evidence of mitral valve regurgitation. No evidence of mitral valve stenosis. Tricuspid Valve: The tricuspid valve is normal in structure. Tricuspid valve regurgitation is trivial. No evidence of tricuspid stenosis. Aortic Valve: The aortic valve is tricuspid. Aortic valve regurgitation is not visualized. Mild  aortic valve sclerosis is present, with no evidence of aortic valve stenosis. Aortic valve peak gradient measures 12.1 mmHg. Pulmonic Valve: The pulmonic valve was normal in structure. Pulmonic valve regurgitation is not visualized. No evidence of pulmonic stenosis. Aorta: The aortic root is normal in size and structure. Venous: The inferior vena cava is normal in size with greater than 50% respiratory variability, suggesting right atrial pressure of 3 mmHg. IAS/Shunts: No atrial level shunt detected by color flow Doppler.  LEFT VENTRICLE PLAX 2D LVIDd:         3.80 cm  Diastology LVIDs:         2.00 cm  LV e' medial:    5.98 cm/s LV PW:         1.00 cm  LV E/e' medial:  17.7 LV IVS:        0.70 cm  LV e' lateral:   8.27 cm/s LVOT diam:     1.90 cm  LV E/e' lateral: 12.8 LV SV:         64 LV SV Index:   40 LVOT Area:     2.84 cm  RIGHT VENTRICLE RV Basal diam:  2.70 cm RV S prime:     18.70 cm/s TAPSE (M-mode): 2.0 cm LEFT ATRIUM             Index       RIGHT ATRIUM          Index LA diam:        3.00 cm 1.88 cm/m  RA Area:     8.21 cm LA Vol (A2C):   26.2 ml 16.38 ml/m RA Volume:   13.90 ml 8.69 ml/m LA Vol (A4C):   31.4 ml 19.64 ml/m LA Biplane Vol: 31.2 ml 19.51 ml/m  AORTIC VALVE AV Area (Vmax): 2.20 cm  AV Vmax:        174.00 cm/s AV Peak Grad:   12.1 mmHg LVOT Vmax:      135.00 cm/s LVOT Vmean:     90.500 cm/s LVOT VTI:       0.225 m  AORTA Ao Root diam: 2.50 cm MITRAL VALVE                TRICUSPID VALVE MV Area (PHT): 6.22 cm     TR Peak grad:   23.0 mmHg MV Decel Time: 122 msec     TR Vmax:        240.00 cm/s MV E velocity: 106.00 cm/s MV A velocity: 97.90 cm/s   SHUNTS MV E/A ratio:  1.08         Systemic VTI:  0.22 m                             Systemic Diam: 1.90 cm Kirk Ruths MD Electronically signed by Kirk Ruths MD Signature Date/Time: 01/08/2021/3:18:53 PM    Final    VAS [place] LOWER EXTREMITY VENOUS (DVT)  Result Date: 01/08/2021  Lower Venous DVT Study Patient Name:  CAROL THEYS   Date of Exam:   01/08/2021 Medical Rec #: [id number]     Accession #:    [id number] Date of Birth: Jun 29, 1961     Patient Gender: F Patient Age:   60Y Exam Location:  [facility] Procedure:      VAS [place] LOWER EXTREMITY VENOUS (DVT) Referring Phys: [id number] VISHAL R PATEL --------------------------------------------------------------------------------  Indications: Edema.  Risk Factors: Chemotherapy Cancer - metastatic breast. Comparison Study: No previous exams Performing Technologist: Jody Hill RVT, RDMS  Examination Guidelines: A complete evaluation includes B-mode imaging, spectral Doppler, color Doppler, and power Doppler as needed of all accessible portions of each vessel. Bilateral testing is considered an integral part of a complete examination. Limited examinations for reoccurring indications may be performed as noted. The reflux portion of the exam is performed with the patient in reverse Trendelenburg.  +---------+---------------+---------+-----------+----------+--------------+ RIGHT    CompressibilityPhasicitySpontaneityPropertiesThrombus Aging +---------+---------------+---------+-----------+----------+--------------+ CFV      Full           Yes      Yes                                 +---------+---------------+---------+-----------+----------+--------------+ SFJ      Full                                                        +---------+---------------+---------+-----------+----------+--------------+ FV Prox  Full           Yes      Yes                                 +---------+---------------+---------+-----------+----------+--------------+ FV Mid   Full           Yes      Yes                                 +---------+---------------+---------+-----------+----------+--------------+ FV DistalFull  Yes      Yes                                 +---------+---------------+---------+-----------+----------+--------------+ PFV      Full                                                         +---------+---------------+---------+-----------+----------+--------------+ POP      Full           Yes      Yes                                 +---------+---------------+---------+-----------+----------+--------------+ PTV      Full                                                        +---------+---------------+---------+-----------+----------+--------------+ PERO     Full                                                        +---------+---------------+---------+-----------+----------+--------------+   +---------+---------------+---------+-----------+----------+--------------+ LEFT     CompressibilityPhasicitySpontaneityPropertiesThrombus Aging +---------+---------------+---------+-----------+----------+--------------+ CFV      Full           Yes      Yes                                 +---------+---------------+---------+-----------+----------+--------------+ SFJ      Full                                                        +---------+---------------+---------+-----------+----------+--------------+ FV Prox  Full           Yes      Yes                                 +---------+---------------+---------+-----------+----------+--------------+ FV Mid   Full           Yes      Yes                                 +---------+---------------+---------+-----------+----------+--------------+ FV DistalFull           Yes      Yes                                 +---------+---------------+---------+-----------+----------+--------------+ PFV      Full                                                        +---------+---------------+---------+-----------+----------+--------------+  POP      Full           Yes      Yes                                 +---------+---------------+---------+-----------+----------+--------------+ PTV      Full                                                         +---------+---------------+---------+-----------+----------+--------------+ PERO     Full                                                        +---------+---------------+---------+-----------+----------+--------------+     Summary: BILATERAL: - No evidence of deep vein thrombosis seen in the lower extremities, bilaterally. - No evidence of superficial venous thrombosis in the lower extremities, bilaterally. -No evidence of popliteal cyst, bilaterally. -Bilateral subcutaneous edema   *See table(s) above for measurements and observations. Electronically signed by Monica Martinez MD on 01/08/2021 at 12:44:50 PM.    Final    US THORACENTESIS ASP PLEURAL SPACE W/IMG GUIDE  Result Date: 12/29/2020 INDICATION: Patient with history of breast cancer, recurrent ascites, dyspnea, and bilateral pleural effusions, left > right. Request is made for diagnostic and therapeutic left thoracentesis. EXAM: ULTRASOUND GUIDED DIAGNOSTIC AND THERAPEUTIC LEFT THORACENTESIS MEDICATIONS: 10 mL 1% lidocaine COMPLICATIONS: None immediate. PROCEDURE: An ultrasound guided thoracentesis was thoroughly discussed with the patient and questions answered. The benefits, risks, alternatives and complications were also discussed. The patient understands and wishes to proceed with the procedure. Written consent was obtained. Ultrasound was performed to localize and mark an adequate pocket of fluid in the left chest. The area was then prepped and draped in the normal sterile fashion. 1% Lidocaine was used for local anesthesia. Under ultrasound guidance a 6 Fr Safe-T-Centesis catheter was introduced. Thoracentesis was performed. The catheter was removed and a dressing applied. FINDINGS: A total of approximately 600 mL of serosanguineous fluid was removed. Samples were sent to the laboratory as requested by the clinical team. IMPRESSION: Successful ultrasound guided left thoracentesis yielding 600 mL of pleural fluid. Read by: Earley Abide,  PA-C Electronically Signed   By: Miachel Roux M.D.   On: 12/29/2020 13:54     ASSESSMENT: 60 y.o. [place] woman status post left mastectomy and sentinel lymph node sampling in November 2010 for a T1c N0, stage IA invasive lobular carcinoma, grade 2, strongly estrogen and progesterone receptor positive, HER-2/neu negative, with a low proliferation fraction.   (A) CA 27 29 was WNL  (B) oncotype score 14 (risk of distant recurrence 9%, no chemotherapy benefit)  (1) On tamoxifen starting December 2010, discontinued December 2013 for financial reasons, resumed briefly  March 2016  METASTATIC DISEASE: (2) presenting with diplopia March 2022  (a) MRA head w/o contrast 10/01/2020 shows a small extradural ICA aneurysm  (b) repeat MRI with contrast (date? Not in our system) showed a mass behind the left eye  (c) CT scan of the chest abdomen and pelvis 11/14/2020 shows bulky mediastinal adenopathy, mild right hilar adenopathy, indeterminate bilateral very small pulmonary  nodules, large volume ascites, hepatoduodenal ligament and ileocolic mesenteric adenopathy, and a 5 cm soft tissue lesion in the right adnexal space.  (d) cytology from paracentesis 11/20/2020 nondiagnostic  (e) biopsy of right adnexal mass 12/08/2020 most consistent with metastatic mammary carcinoma, estrogen receptor positive, HER2 nonamplified  (f) on 11/21/2020 the CEA was 6.2, CA 27-29 was 151.8, and Ca1 25 was 1108  (3) palliative radiation to the left periorbital mass completed 12/05/2020  (4) anastrozole started 11/20/2020  (a) abemaciclib added 12/09/2020, interrupted repeatedly due to intervening admissions  (b) anastrozole and abemaciclib discontinued June 2022 with evidence of progression  (5) to start capecitabine 01/16/2021 at 1000 mg BID 14/7 (d0se reduced due to renal dysfunction)   PLAN:  I gave Modene and her daughter a copy of her diagnosis and treatment summary above and discussed it in detail so that they would  understand the meaning of all the medical terms.  I also gave them a copy of the pathology reports, the radiology reports, lab work and summaries.  Hopefully this will facilitate her evaluation.  The daughter is planning to get Marshayla enrolled in the [facility] and then have her seen at the [facility].  They understand there is urgency to this.  We have been working to get the capecitabine in 2 Jyll's hands.  This appears to be close to being done.  They will be in town and another 2 days so perhaps she will leave here with the medication.  I have asked the daughter not to start the capecitabine until they have an appointment with an oncologist, even if the appointment is for a week later.  They does need to have someone to help them if there are any side effects or problems that might be unexpected  We reviewed the fact that Filippa's cancer is not curable but that it is treatable.  There are many treatments available.  I think capecitabine is a very reasonable next step but if her oncologist locally wants to try something else I would have no objection  [facility] and [facility] we will be glad to again participate in her care  Total encounter time 25 minutes.Sarajane Jews C. Kamyiah Colantonio MD Oncology and Hematology [facility] [address] [place], [place] [address] Tel. [phone]  Joylene Igo [phone]   I, Wilburn Mylar, am acting as scribe for Dr. Sarajane Jews C. Norva Bowe.  I, Lurline Del MD, have reviewed the above documentation for accuracy and completeness, and I agree with the above.   *Total Encounter Time as defined by the Centers for Medicare and Medicaid Services includes, in addition to the face-to-face time of a patient visit (documented in the note above) non-face-to-face time: obtaining and reviewing outside history, ordering and reviewing medications, tests or procedures, care coordination (communications with other  health care professionals or caregivers) and documentation in the medical record.

## 2021-01-15 ENCOUNTER — Ambulatory Visit: Payer: 59 | Admitting: Adult Health

## 2021-01-15 LAB — CANCER ANTIGEN 27.29: CA 27.29: 9.4 U/mL (ref 0.0–38.6)

## 2021-01-15 LAB — CA 125: Cancer Antigen (CA) 125: 6.5 U/mL (ref 0.0–38.1)

## 2021-01-15 NOTE — Telephone Encounter (Signed)
Oral Chemotherapy Pharmacist Encounter  I spoke with patient for overview of: Xeloda (capecitabine) for the  treatment of metastatic HR positive, HER-2 negative breast cancer, planned duration until disease progression or unacceptable drug toxicity.  Counseled patient on administration, dosing, side effects, monitoring, drug-food interactions, safe handling, storage, and disposal.  Patient will take Xeloda 553m tablets, 2 tablets (10016m by mouth in AM and 2 tabs (100056mby mouth in PM, within 30 minutes of finishing meals, for 14 days on, 7 days off, repeated every 21 days.  Xeloda start date: pending start date per MD, patient already discussed with MD she will not start until established with [facility] [place] include but are not limited to: fatigue, decreased blood counts, GI upset, diarrhea, mouth sores, and hand-foot syndrome.  Patient will obtain anti diarrheal and alert the office of 4 or more loose stools above baseline.  Reviewed with patient importance of keeping a medication schedule and plan for any missed doses. No barriers to medication adherence identified.  Medication reconciliation performed and medication/allergy list updated. Patient stated she is no longer taking pantoprazole. Updated medication list.   Patient's insurance requires that Xeloda be filled [facility] provided with phone number to call and set up shipment ([phone] stated she is moving this upcoming weekend and I instructed her to [facility] to ship the medication to. Ms. AvaTesorierorbalized understanding.   All questions answered.  Ms. AvaCharbonneauiced understanding and appreciation.   Medication education handout placed in mail for patient. Patient knows to call the office with questions or concerns. Oral Chemotherapy Clinic phone number provided.   RebLeron CroakPharmD, BCPS Hematology/Oncology Clinical Pharmacist [phone]/2022 11:44 AM

## 2021-01-21 ENCOUNTER — Other Ambulatory Visit: Payer: Self-pay | Admitting: *Deleted

## 2021-01-21 DIAGNOSIS — C50919 Malignant neoplasm of unspecified site of unspecified female breast: Secondary | ICD-10-CM

## 2021-01-21 MED ORDER — CAPECITABINE 500 MG PO TABS: 1000.0000 mg | ORAL_TABLET | Freq: Two times a day (BID) | ORAL | 6 refills | Status: AC

## 2021-01-21 NOTE — Telephone Encounter (Signed)
This RN was contacted by [facility] mail order pharmacy that they received data per capecitabine prescription from [place] ( previously [facility] ) " they cannot fill the prescription "  Requesting new prescription to be sent to them.  This RN obtained and sent per above with note that pt is now residing with daughter and gave her name with contact number for them to call to verify shipping address.

## 2021-02-06 ENCOUNTER — Other Ambulatory Visit: Payer: Self-pay | Admitting: *Deleted

## 2021-02-10 NOTE — Progress Notes (Signed)
                                                                                                                                                             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   Plan: The patient is scheduled to follow-up with radiation oncology in 1 mo .  -----------------------------------  Eppie Gibson, MD

## 2021-02-16 DEATH — deceased

## 2021-02-24 ENCOUNTER — Other Ambulatory Visit: Payer: 59

## 2021-02-24 ENCOUNTER — Ambulatory Visit: Payer: 59 | Admitting: Oncology

## 2021-06-22 IMAGING — CR DG ABDOMEN ACUTE W/ 1V CHEST
3 series · 3 of 3 positions shown · non-contrast
Comparison: 12/29/2020

CLINICAL DATA: Shortness of breath and abdominal pain

EXAM:
DG ABDOMEN ACUTE WITH 1 VIEW CHEST

[x chest ap]
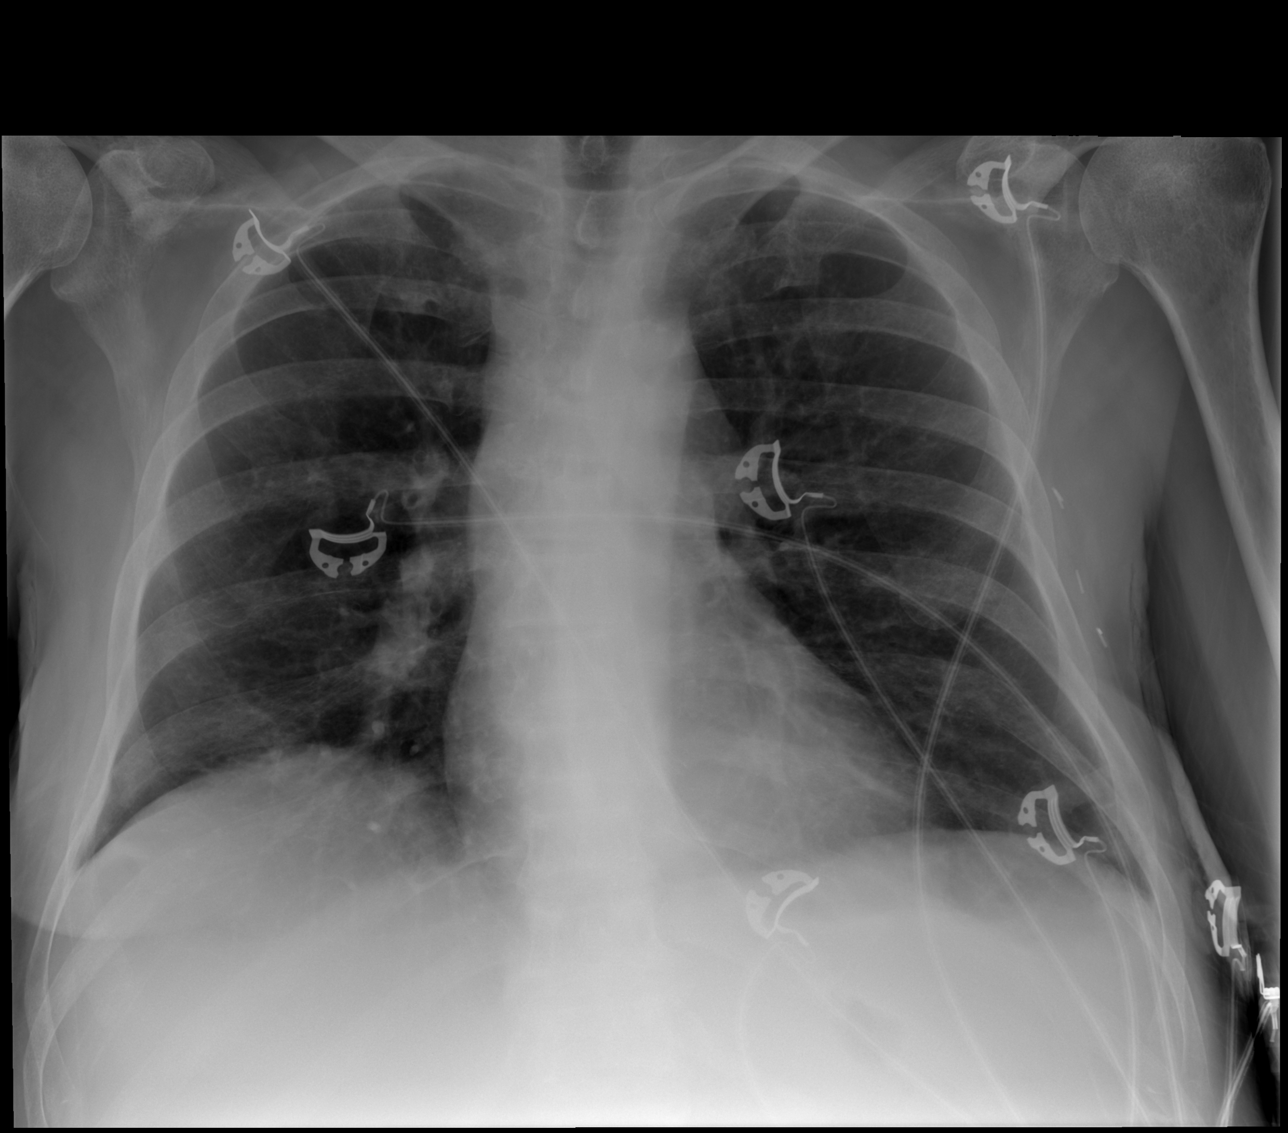

[x abdomen erect]
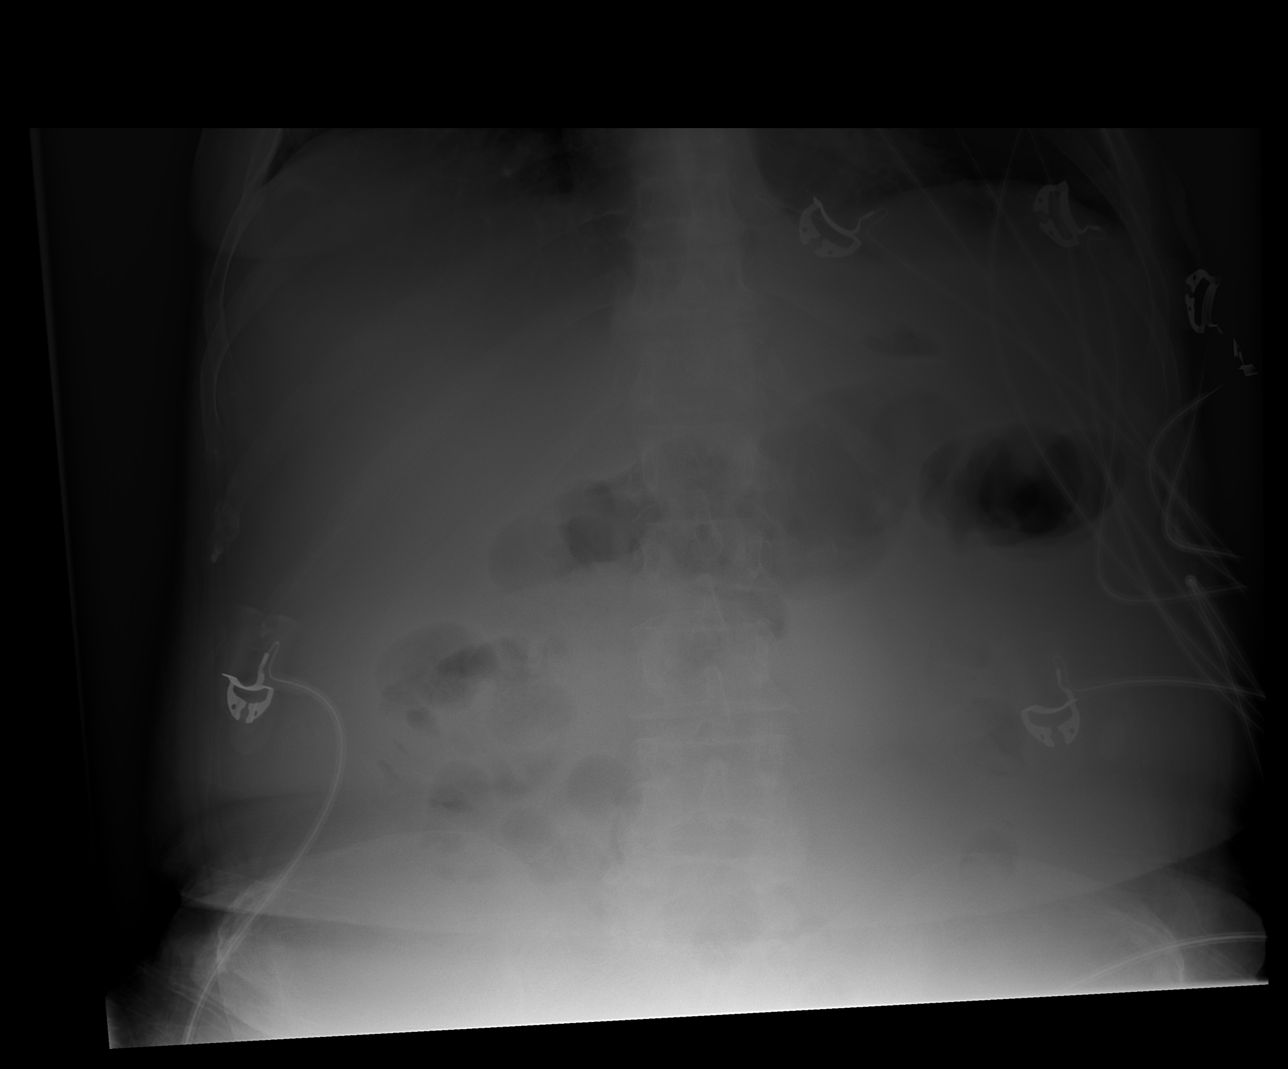

[t abdomen supine]
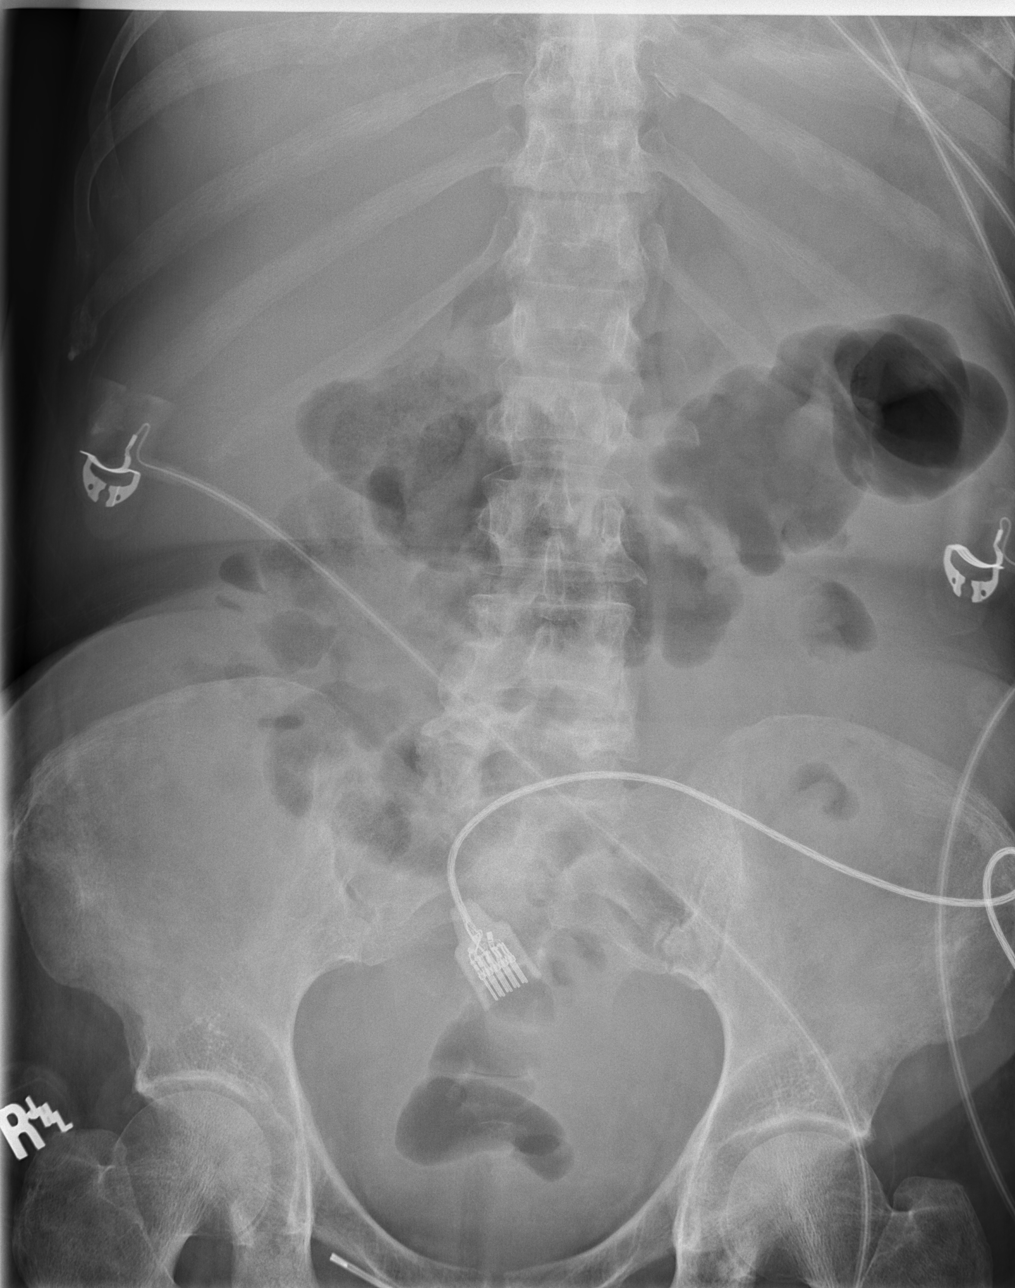

[3 of 3 positions shown; findings below may reference images not displayed]

FINDINGS: Single-view chest demonstrates no focal opacity or pleural effusion.
Normal cardiomediastinal silhouette. No pneumothorax.

Supine and upright views of the abdomen demonstrate no free air
beneath the diaphragm. Nonobstructed gas pattern with scattered
colon gas. No radiopaque calculi.
IMPRESSION: Negative abdominal radiographs.  No acute cardiopulmonary disease.

## 2021-06-23 IMAGING — US US PARACENTESIS
1 series · 4 of 4 positions shown · non-contrast
Comparison: none

INDICATION: Patient with history of metastatic breast cancer, abdominal
distension, and recurrent ascites. Request is made for diagnostic
and therapeutic paracentesis.

[Series 1: us paracentesis mc & wl · 4 of 4 slices shown]
[im 1/4]
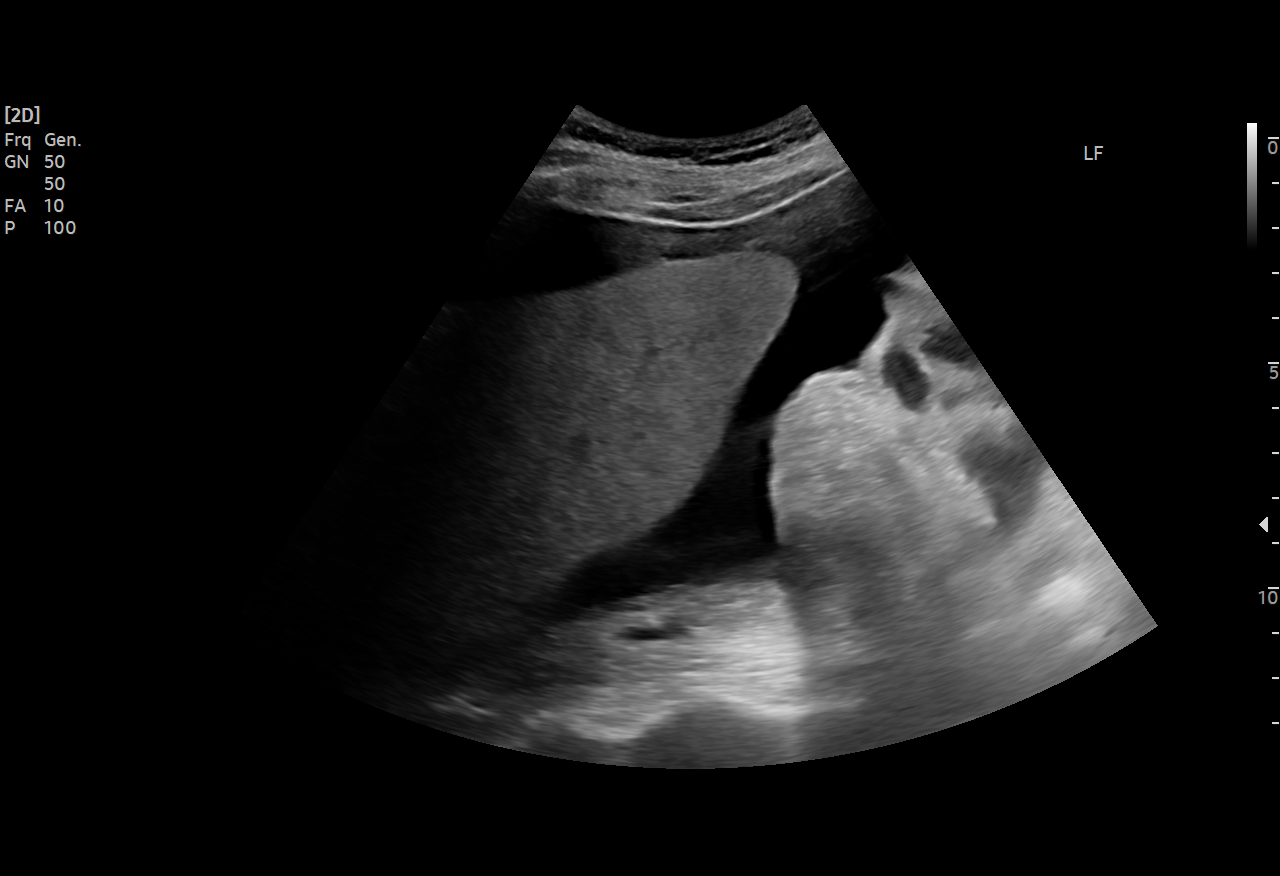
[im 2/4]
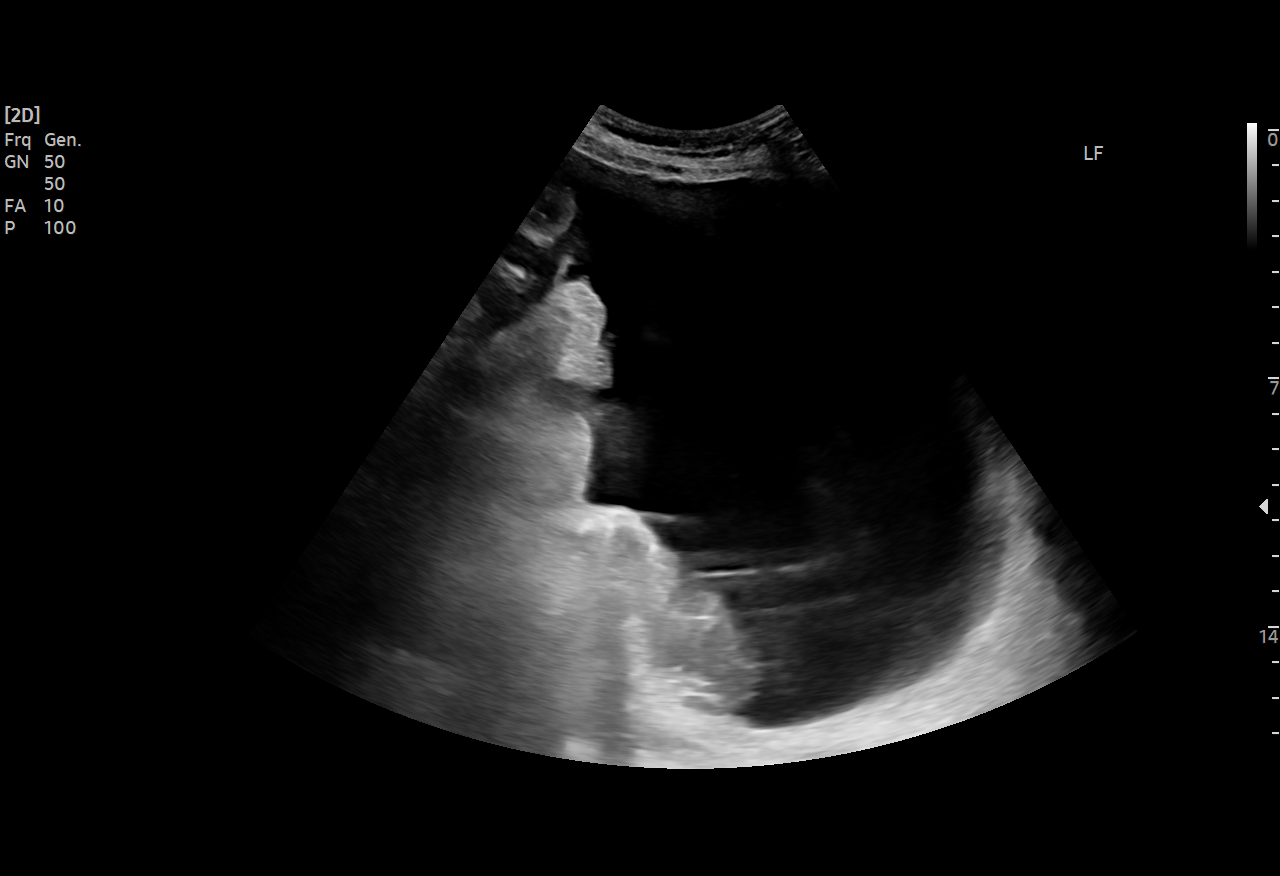
[im 3/4]
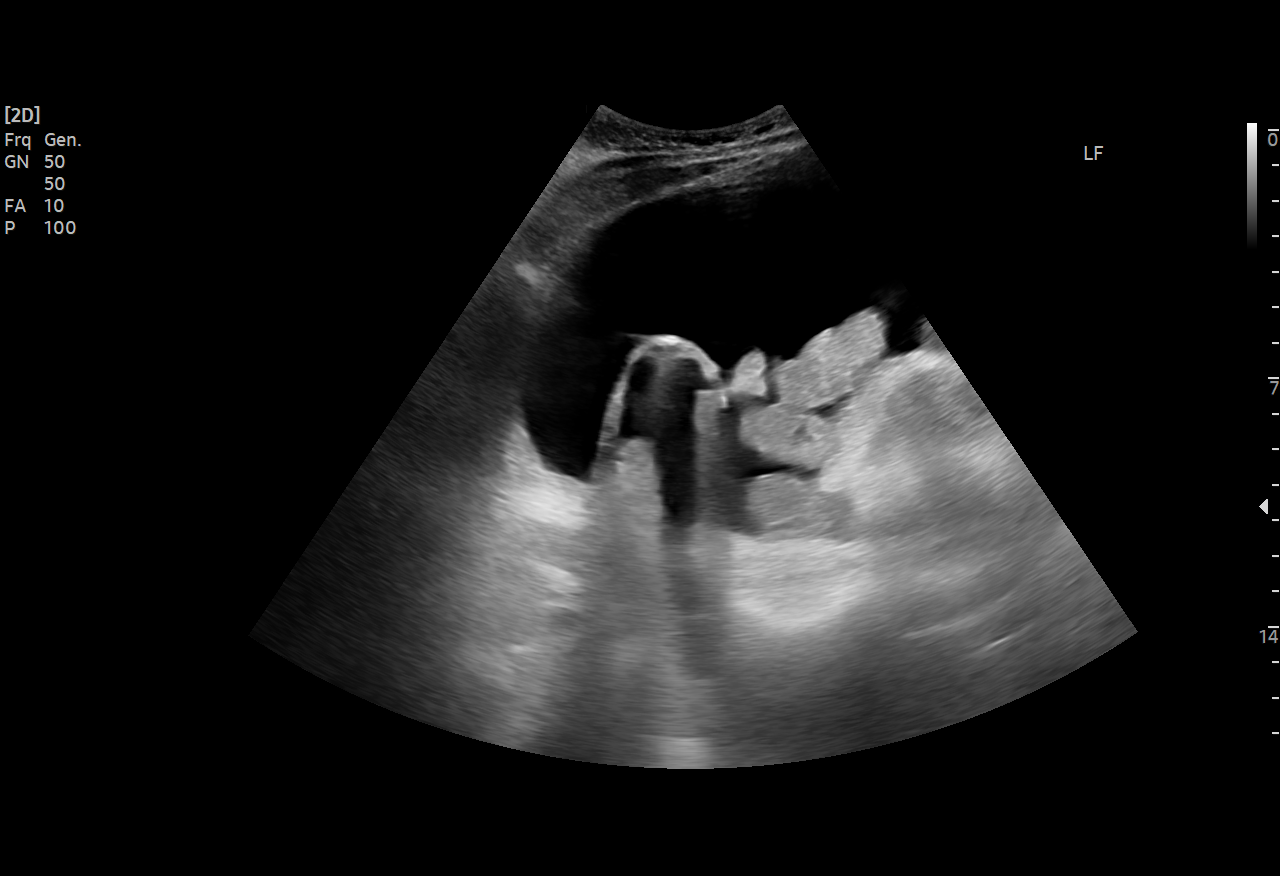
[im 4/4]
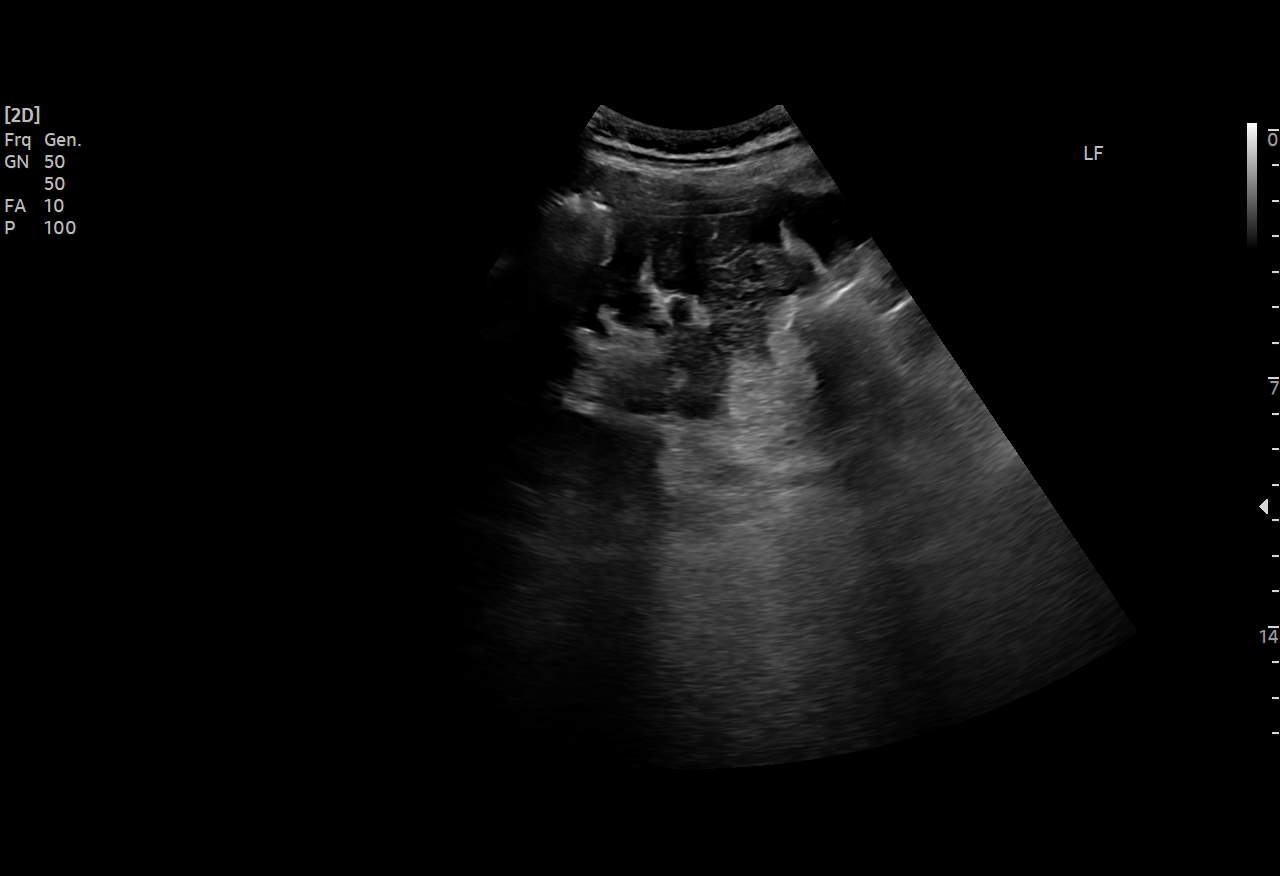

[4 of 4 positions shown; findings below may reference images not displayed]

EXAM:
ULTRASOUND GUIDED DIAGNOSTIC AND THERAPEUTIC PARACENTESIS

MEDICATIONS:
15 mL 1% lidocaine

COMPLICATIONS:
None immediate.

PROCEDURE:
Informed written consent was obtained from the patient after a
discussion of the risks, benefits and alternatives to treatment. A
timeout was performed prior to the initiation of the procedure.

Initial ultrasound scanning demonstrates a large amount of ascites
within the left lower abdominal quadrant. The left lower abdomen was
prepped and draped in the usual sterile fashion. 1% lidocaine was
used for local anesthesia.

Following this, a 19 gauge, 7-cm, Yueh catheter was introduced. An
ultrasound image was saved for documentation purposes. The
paracentesis was performed. The catheter was removed and a dressing
was applied. The patient tolerated the procedure well without
immediate post procedural complication.
FINDINGS: A total of approximately 4.5 L of clear yellow fluid was removed.
Samples were sent to the laboratory as requested by the clinical
team.
IMPRESSION: Successful ultrasound-guided paracentesis yielding 4.5 L of
peritoneal fluid.

## 2023-11-12 ENCOUNTER — Other Ambulatory Visit (HOSPITAL_BASED_OUTPATIENT_CLINIC_OR_DEPARTMENT_OTHER): Payer: Self-pay
